# Patient Record
Sex: Female | Born: 1935 | Race: White | Hispanic: No | Smoking: Former smoker
Health system: Southern US, Community
[De-identification: ages and names within clinical notes are randomized; demographics above are authoritative.]

## PROBLEM LIST (undated history)

## (undated) DIAGNOSIS — E039 Hypothyroidism, unspecified: Secondary | ICD-10-CM

## (undated) DIAGNOSIS — K219 Gastro-esophageal reflux disease without esophagitis: Secondary | ICD-10-CM

## (undated) DIAGNOSIS — D509 Iron deficiency anemia, unspecified: Principal | ICD-10-CM

## (undated) DIAGNOSIS — F0390 Unspecified dementia without behavioral disturbance: Secondary | ICD-10-CM

## (undated) DIAGNOSIS — E119 Type 2 diabetes mellitus without complications: Secondary | ICD-10-CM

## (undated) DIAGNOSIS — N183 Chronic kidney disease, stage 3 (moderate): Secondary | ICD-10-CM

## (undated) DIAGNOSIS — E7800 Pure hypercholesterolemia, unspecified: Secondary | ICD-10-CM

## (undated) DIAGNOSIS — I10 Essential (primary) hypertension: Secondary | ICD-10-CM

## (undated) DIAGNOSIS — G459 Transient cerebral ischemic attack, unspecified: Secondary | ICD-10-CM

## (undated) DIAGNOSIS — M1990 Unspecified osteoarthritis, unspecified site: Secondary | ICD-10-CM

## (undated) DIAGNOSIS — D631 Anemia in chronic kidney disease: Secondary | ICD-10-CM

## (undated) HISTORY — DX: Pure hypercholesterolemia, unspecified: E78.00

## (undated) HISTORY — DX: Iron deficiency anemia, unspecified: D50.9

## (undated) HISTORY — PX: BLADDER SUSPENSION: SHX72

## (undated) HISTORY — PX: CATARACT EXTRACTION: SUR2

## (undated) HISTORY — DX: Hypothyroidism, unspecified: E03.9

## (undated) HISTORY — DX: Gastro-esophageal reflux disease without esophagitis: K21.9

## (undated) HISTORY — DX: Unspecified dementia without behavioral disturbance: F03.90

## (undated) HISTORY — DX: Transient cerebral ischemic attack, unspecified: G45.9

## (undated) HISTORY — DX: Anemia in chronic kidney disease: D63.1

## (undated) HISTORY — DX: Type 2 diabetes mellitus without complications: E11.9

## (undated) HISTORY — PX: ANTERIOR (CYSTOCELE) AND POSTERIOR REPAIR (RECTOCELE) WITH XENFORM GRAFT AND SACROSPINOUS FIXATION: SHX6492

## (undated) HISTORY — DX: Unspecified osteoarthritis, unspecified site: M19.90

## (undated) HISTORY — DX: Chronic kidney disease, stage 3 (moderate): N18.3

## (undated) HISTORY — DX: Essential (primary) hypertension: I10

## (undated) HISTORY — PX: VAGINAL HYSTERECTOMY: SUR661

## (undated) HISTORY — PX: BREAST BIOPSY: SHX20

---

## 2007-05-02 HISTORY — PX: COLONOSCOPY: SHX174

## 2008-05-06 HISTORY — PX: TOTAL HIP ARTHROPLASTY: SHX124

## 2011-02-02 HISTORY — PX: CHOLECYSTECTOMY: SHX55

## 2011-05-19 LAB — HM MAMMOGRAPHY: HM Mammogram: NEGATIVE

## 2012-02-02 HISTORY — PX: ERCP: SHX5425

## 2014-02-12 ENCOUNTER — Encounter: Payer: Self-pay | Admitting: Hematology & Oncology

## 2014-03-01 ENCOUNTER — Telehealth: Payer: Self-pay | Admitting: Hematology & Oncology

## 2014-03-01 NOTE — Telephone Encounter (Signed)
I spoke w NEW PATIENT med tech Aniceto Boss w [place]) today to remind them of their appointment with Dr. Marin Olp. Also, advised them to bring all medication bottles and insurance card information.  They said the pt will have med list on a card.

## 2014-03-04 ENCOUNTER — Other Ambulatory Visit (HOSPITAL_BASED_OUTPATIENT_CLINIC_OR_DEPARTMENT_OTHER): Payer: Medicare Other | Admitting: Lab

## 2014-03-04 ENCOUNTER — Ambulatory Visit (HOSPITAL_BASED_OUTPATIENT_CLINIC_OR_DEPARTMENT_OTHER): Payer: Medicare Other | Admitting: Family

## 2014-03-04 ENCOUNTER — Ambulatory Visit: Payer: Medicare Other

## 2014-03-04 VITALS — BP 160/53 | HR 74 | Temp 98.0°F | Resp 16 | Ht 60.0 in | Wt 118.0 lb

## 2014-03-04 DIAGNOSIS — D509 Iron deficiency anemia, unspecified: Secondary | ICD-10-CM | POA: Diagnosis not present

## 2014-03-04 DIAGNOSIS — D61818 Other pancytopenia: Secondary | ICD-10-CM | POA: Diagnosis not present

## 2014-03-04 DIAGNOSIS — D500 Iron deficiency anemia secondary to blood loss (chronic): Secondary | ICD-10-CM

## 2014-03-04 LAB — IRON AND TIBC CHCC
%SAT: 36 % (ref 21–57)
Iron: 106 ug/dL (ref 41–142)
TIBC: 293 ug/dL (ref 236–444)
UIBC: 187 ug/dL (ref 120–384)

## 2014-03-04 LAB — CMP (CANCER CENTER ONLY)
ALBUMIN: 3 g/dL — AB (ref 3.3–5.5)
ALT(SGPT): 12 U/L (ref 10–47)
AST: 19 U/L (ref 11–38)
Alkaline Phosphatase: 45 U/L (ref 26–84)
BILIRUBIN TOTAL: 0.3 mg/dL (ref 0.20–1.60)
BUN: 27 mg/dL — AB (ref 7–22)
CALCIUM: 10.4 mg/dL — AB (ref 8.0–10.3)
CO2: 26 mEq/L (ref 18–33)
CREATININE: 1.4 mg/dL — AB (ref 0.6–1.2)
Chloride: 101 mEq/L (ref 98–108)
GLUCOSE: 107 mg/dL (ref 73–118)
Potassium: 4.2 mEq/L (ref 3.3–4.7)
Sodium: 135 mEq/L (ref 128–145)
Total Protein: 6.5 g/dL (ref 6.4–8.1)

## 2014-03-04 LAB — FERRITIN CHCC: FERRITIN: 45 ng/mL (ref 9–269)

## 2014-03-04 LAB — CBC WITH DIFFERENTIAL (CANCER CENTER ONLY)
HCT: 29.8 % — ABNORMAL LOW (ref 34.8–46.6)
HGB: 9.6 g/dL — ABNORMAL LOW (ref 11.6–15.9)
MCH: 31.4 pg (ref 26.0–34.0)
MCHC: 32.2 g/dL (ref 32.0–36.0)
MCV: 97 fL (ref 81–101)
Platelets: 249 10*3/uL (ref 145–400)
RBC: 3.06 10*6/uL — ABNORMAL LOW (ref 3.70–5.32)
RDW: 13 % (ref 11.1–15.7)
WBC: 3.3 10*3/uL — ABNORMAL LOW (ref 3.9–10.0)

## 2014-03-04 LAB — CHCC SATELLITE - SMEAR

## 2014-03-04 LAB — MANUAL DIFFERENTIAL (CHCC SATELLITE)
ALC: 1 10*3/uL (ref 0.6–2.2)
ANC (CHCC HP manual diff): 2 10*3/uL (ref 1.5–6.7)
Band Neutrophils: 2 % (ref 0–10)
LYMPH: 29 % (ref 14–48)
MONO: 11 % (ref 0–13)
PLT EST ~~LOC~~: ADEQUATE
Platelet Morphology: NORMAL
SEG: 58 % (ref 40–75)

## 2014-03-04 NOTE — Progress Notes (Signed)
Hematology/Oncology Consultation   Name: Allison Page      MRN: [id number]    Location: Room/bed info not found  Date: 03/04/2014 Time:1:18 PM   REFERRING PHYSICIAN:  Hong Thu T. Page  REASON FOR CONSULT:  Leukopenia and anemia   DIAGNOSIS: Iron deficiency anemia  HISTORY OF PRESENT ILLNESS: Ms. Allison Page is a very pleasant 81 yo female with a recent Hgb of 7.9 and WBC of 2.7. She is c/o feeling very tired and wanting to sleep all the time. Her Hgb today is 9.6 WBC 3.3.  She currently lives in an assisted living facility. She is on iron pills which have caused her to become constipated. As a result she has been placed on Miralax, lactulose and colace. She has trouble crossing a room without getting diarrhea. We will stop all this. She is currently on Zantac and omeprazole for GERD so there is a good chance she is not absorbing the oral iron.  Her daughter would like for her to be set up with a geriatric primary provider. We will refer her to [facility]. She feels that her mother is on a lot of duplicate and unnecessary medications.  She denies fever, chills, n/v, cough, rash, headaches, SOB, chest pain, palpitations, abdominal pain, blood in urine or stool.  No swelling, tenderness, numbness or tingling in her extremities.  She fell and broke her hip a year ago and had a lot of weight loss during that rehabilitation time. She was 96 lbs ay her lowest point. She is now stable at 118 lbs. Her appetite is good and she is staying hydrated.  Her daughter passed away from esophageal cancer and her son is currently battling colon cancer. She has no personal cancer history. No history of bleeding or clotting disorders.  She does or smoke or drink alcohol. She has had a choley and a hysterectomy.    ROS: All other 10 point review of systems is negative.   PAST MEDICAL HISTORY:   No past medical history on file.  ALLERGIES: Allergies  Allergen Reactions  . Penicillins   . Sulfa  Antibiotics       MEDICATIONS:  No current outpatient prescriptions on file prior to visit.   No current facility-administered medications on file prior to visit.   PAST SURGICAL HISTORY No past surgical history on file.  FAMILY HISTORY: No family history on file.  SOCIAL HISTORY:  has no tobacco, alcohol, and drug history on file.  PERFORMANCE STATUS: The patient's performance status is 1 - Symptomatic but completely ambulatory  PHYSICAL EXAM: Most Recent Vital Signs: Blood pressure 160/53, pulse 74, temperature 98 F (36.7 C), temperature source Oral, resp. rate 16, height 5' (1.524 m), weight 118 lb (53.524 kg). BP 160/53 mmHg  Pulse 74  Temp(Src) 98 F (36.7 C) (Oral)  Resp 16  Ht 5' (1.524 m)  Wt 118 lb (53.524 kg)  BMI 23.05 kg/m2  General Appearance:    Alert, cooperative, no distress, appears stated age  Head:    Normocephalic, without obvious abnormality, atraumatic  Eyes:    PERRL, conjunctiva/corneas clear, EOM's intact, fundi    benign, both eyes        Throat:   Lips, mucosa, and tongue normal; teeth and gums normal  Neck:   Supple, symmetrical, trachea midline, no adenopathy;    thyroid:  no enlargement/tenderness/nodules; no carotid   bruit or JVD  Back:     Symmetric, no curvature, ROM normal, no CVA tenderness  Lungs:  Clear to auscultation bilaterally, respirations unlabored  Chest Wall:    No tenderness or deformity   Heart:    Regular rate and rhythm, S1 and S2 normal, no murmur, rub   or gallop     Abdomen:     Soft, non-tender, bowel sounds active all four quadrants,    no masses, no organomegaly        Extremities:   Extremities normal, atraumatic, no cyanosis or edema  Pulses:   2+ and symmetric all extremities  Skin:   Skin color, texture, turgor normal, no rashes or lesions  Lymph nodes:   Cervical, supraclavicular, and axillary nodes normal  Neurologic:   CNII-XII intact, normal strength, sensation and reflexes    throughout     LABORATORY DATA:  Results for orders placed or performed in visit on 03/04/14 (from the past 48 hour(s))  CBC with Differential [facility])     Status: Abnormal   Collection Time: 03/04/14 10:12 AM  Result Value Ref Range   WBC 3.3 (L) 3.9 - 10.0 10e3/uL   RBC 3.06 (L) 3.70 - 5.32 10e6/uL   HGB 9.6 (L) 11.6 - 15.9 g/dL   HCT 29.8 (L) 34.8 - 46.6 %   MCV 97 81 - 101 fL   MCH 31.4 26.0 - 34.0 pg   MCHC 32.2 32.0 - 36.0 g/dL   RDW 13.0 11.1 - 15.7 %   Platelets 249 145 - 400 10e3/uL  [facility] - Smear     Status: None   Collection Time: 03/04/14 10:12 AM  Result Value Ref Range   Smear Result Smear Available   COMPREHENSIVE METABOLIC PANEL ([facility] REFLEX ONLY)     Status: Abnormal   Collection Time: 03/04/14 10:12 AM  Result Value Ref Range   Sodium 135 128 - 145 mEq/L   Potassium 4.2 3.3 - 4.7 mEq/L   Chloride 101 98 - 108 mEq/L   CO2 26 18 - 33 mEq/L   Glucose, Bld 107 73 - 118 mg/dL   BUN, Bld 27 (H) 7 - 22 mg/dL   Creat 1.4 (H) 0.6 - 1.2 mg/dl   Total Bilirubin 0.30 0.20 - 1.60 mg/dl   Alkaline Phosphatase 45 26 - 84 U/L   AST 19 11 - 38 U/L   ALT(SGPT) 12 10 - 47 U/L   Total Protein 6.5 6.4 - 8.1 g/dL   Albumin 3.0 (L) 3.3 - 5.5 g/dL   Calcium 10.4 (H) 8.0 - 10.3 mg/dL  Manual Differential ([facility])     Status: None   Collection Time: 03/04/14 10:12 AM  Result Value Ref Range   ANC ([facility] manual diff) 2.0 1.5 - 6.7 10e3/uL   ALC 1.0 0.6 - 2.2 10e3/uL   SEG 58 40 - 75 %   Band Neutrophils 2 0 - 10 %   LYMPH 29 14 - 48 %   MONO 11 0 - 13 %   Polychromasia Slight Slight   Ovalocyte Few Negative   PLT EST [place] Adequate Adequate   Platelet Morphology Within Normal Limits Within Normal Limits      RADIOGRAPHY: No results found.     PATHOLOGY: None  ASSESSMENT/PLAN: Ms. Allison Page is a very pleasant 81 yo female with a recent Hgb of 7.9 and WBC of 2.7. She is c/o feeling very tired and wanting to sleep all the time. Her Hgb today is 9.6 WBC 3.3.  She is tired and wants to sleep a lot of the time.  We will discontinue  her Nu-iron, lactulose, miralax and colace.  We referred her to [facility] for primary care.  We will see what her iron studies and erythropoietin level show and then schedule her follow-up at that time. She may need Fereheme and Aranesp.  All questions were answered. She and her daughter both know to call the clinic with any problems, questions or concerns. We can certainly see her much sooner if necessary. She was discussed with Dr. Marin Olp and he is in agreement with the aforementioned.   [facility]

## 2014-03-06 ENCOUNTER — Telehealth: Payer: Self-pay | Admitting: Hematology & Oncology

## 2014-03-06 LAB — ERYTHROPOIETIN: Erythropoietin: 21.3 m[IU]/mL — ABNORMAL HIGH (ref 2.6–18.5)

## 2014-03-06 LAB — RETICULOCYTES (CHCC)
ABS RETIC: 31.5 10*3/uL (ref 19.0–186.0)
RBC.: 3.15 MIL/uL — AB (ref 3.87–5.11)
Retic Ct Pct: 1 % (ref 0.4–2.3)

## 2014-03-06 LAB — VITAMIN B12: Vitamin B-12: >2000 pg/mL — ABNORMAL HIGH (ref 211–911)

## 2014-03-06 NOTE — Telephone Encounter (Signed)
daughter called left message. I called her back and left message

## 2014-03-21 ENCOUNTER — Encounter: Payer: Self-pay | Admitting: Hematology & Oncology

## 2014-03-21 ENCOUNTER — Other Ambulatory Visit: Payer: Self-pay | Admitting: Hematology & Oncology

## 2014-03-21 DIAGNOSIS — N183 Chronic kidney disease, stage 3 unspecified: Secondary | ICD-10-CM

## 2014-03-21 DIAGNOSIS — D631 Anemia in chronic kidney disease: Secondary | ICD-10-CM | POA: Insufficient documentation

## 2014-03-21 DIAGNOSIS — D509 Iron deficiency anemia, unspecified: Secondary | ICD-10-CM

## 2014-03-21 HISTORY — DX: Chronic kidney disease, stage 3 unspecified: N18.30

## 2014-03-21 HISTORY — DX: Iron deficiency anemia, unspecified: D50.9

## 2014-03-21 HISTORY — DX: Anemia in chronic kidney disease: D63.1

## 2014-03-22 ENCOUNTER — Ambulatory Visit (INDEPENDENT_AMBULATORY_CARE_PROVIDER_SITE_OTHER): Payer: Medicare Other | Admitting: Internal Medicine

## 2014-03-22 VITALS — BP 144/70 | HR 73 | Temp 99.3°F | Resp 12 | Ht 60.0 in | Wt 119.6 lb

## 2014-03-22 DIAGNOSIS — K219 Gastro-esophageal reflux disease without esophagitis: Secondary | ICD-10-CM | POA: Diagnosis not present

## 2014-03-22 DIAGNOSIS — D509 Iron deficiency anemia, unspecified: Secondary | ICD-10-CM | POA: Diagnosis not present

## 2014-03-22 DIAGNOSIS — E034 Atrophy of thyroid (acquired): Secondary | ICD-10-CM

## 2014-03-22 DIAGNOSIS — G8929 Other chronic pain: Secondary | ICD-10-CM

## 2014-03-22 DIAGNOSIS — I10 Essential (primary) hypertension: Secondary | ICD-10-CM | POA: Diagnosis not present

## 2014-03-22 DIAGNOSIS — Z23 Encounter for immunization: Secondary | ICD-10-CM | POA: Diagnosis not present

## 2014-03-22 DIAGNOSIS — K5730 Diverticulosis of large intestine without perforation or abscess without bleeding: Secondary | ICD-10-CM | POA: Insufficient documentation

## 2014-03-22 DIAGNOSIS — G459 Transient cerebral ischemic attack, unspecified: Secondary | ICD-10-CM | POA: Diagnosis not present

## 2014-03-22 DIAGNOSIS — E119 Type 2 diabetes mellitus without complications: Secondary | ICD-10-CM | POA: Diagnosis not present

## 2014-03-22 DIAGNOSIS — M542 Cervicalgia: Secondary | ICD-10-CM

## 2014-03-22 DIAGNOSIS — E038 Other specified hypothyroidism: Secondary | ICD-10-CM | POA: Diagnosis not present

## 2014-03-22 DIAGNOSIS — E039 Hypothyroidism, unspecified: Secondary | ICD-10-CM | POA: Insufficient documentation

## 2014-03-22 DIAGNOSIS — E785 Hyperlipidemia, unspecified: Secondary | ICD-10-CM

## 2014-03-22 MED ORDER — ZOSTER VACCINE LIVE 19400 UNT/0.65ML ~~LOC~~ SOLR: 0.6500 mL | Freq: Once | SUBCUTANEOUS | Status: DC

## 2014-03-22 MED ORDER — TETANUS-DIPHTH-ACELL PERTUSSIS 5-2.5-18.5 LF-MCG/0.5 IM SUSP: 0.5000 mL | Freq: Once | INTRAMUSCULAR | Status: DC

## 2014-03-22 NOTE — Patient Instructions (Signed)
Continue current medications- our office will call facility to determine which meds she is taking  Return to office in 3 mos for routine office visit

## 2014-03-22 NOTE — Progress Notes (Signed)
Patient ID: Allison Page, female   DOB: Aug 06, 1922, 81 y.o.   MRN: [id number]    Facility  [facility]    Place of Service:   OFFICE   Allergies  Allergen Reactions  . Amoxicillin   . Penicillins   . Sulfa Antibiotics     Chief Complaint  Patient presents with  . Establish Care    New patient establish care, medication management (review and evaluate)   . Torticollis    Patient with chronic concern of neck stiffness that radiates into back     HPI:  81 yo female seen today as a new patient. She is c/a her medications and whether she is taking the appropriate medications. She has relocated to [facility] August 15, 2013 from an [place] ALF. She reports feeling well overall. She has chronic pain in neck and back. Takes prn tramadol which helps. No numbness/tingling in hands/feet. No heavy sensation. She does not recall ever having imaging studies. She has epigastric pain intermittently. Per facility [facility], she is taking zantac and omeprazole. She has severe fatigue and easily falls asleep when seated for several minutes. She is seeing hematology Dr Jarrett Ables and plan to start iron infusions and epogen injection.  She believes she was taking aggrenox to prevent TIAs but unsure if she is taking it now.  Medications: Patient's Medications  New Prescriptions   No medications on file  Previous Medications   ALUMINUM-MAGNESIUM HYDROXIDE-SIMETHICONE (MAALOX) 200-200-20 MG/5ML SUSP    2 teaspoons by mouth 3 times daily as needed for heartburn   AMLODIPINE (NORVASC) 5 MG TABLET    Take 5 mg by mouth daily.   ASPIRIN 81 MG TABLET    Take 81 mg by mouth daily.   ATORVASTATIN (LIPITOR) 20 MG TABLET    Take 20 mg by mouth daily at 6 PM.   CLINDAMYCIN (CLEOCIN) 150 MG CAPSULE    4 by mouth 1 hour prior to dental procedures   GUAIFENESIN (MUCINEX) 600 MG 12 HR TABLET    Take 600 mg by mouth 2 (two) times daily as needed.   HYDRALAZINE (APRESOLINE) 100 MG TABLET    Take 100 mg by mouth daily.   LEVOTHYROXINE  (SYNTHROID, LEVOTHROID) 100 MCG TABLET    Take 100 mcg by mouth daily before breakfast.   MAGNESIUM OXIDE (MAG-OX) 400 MG TABLET    Take 400 mg by mouth daily.   MENTHOL, TOPICAL ANALGESIC, (BENGAY ULTRA STRENGTH EX)    Apply topically. Apply 1 patch once daily for pain   OMEPRAZOLE (PRILOSEC) 10 MG CAPSULE    Take 10 mg by mouth daily.   RANITIDINE (ZANTAC) 150 MG TABLET    Take 150 mg by mouth 2 (two) times daily.   TRAMADOL (ULTRAM) 50 MG TABLET    1/2 by mouth twice daily for pain   TRAZODONE (DESYREL) 50 MG TABLET    Take 50 mg by mouth at bedtime.   VITAMIN B-12 (CYANOCOBALAMIN) 1000 MCG TABLET    Take 1,000 mcg by mouth daily.  Modified Medications   Modified Medication Previous Medication   TDAP (BOOSTRIX) 5-2.5-18.5 LF-MCG/0.5 INJECTION Tdap (BOOSTRIX) 5-2.5-18.5 LF-MCG/0.5 injection      Inject 0.5 mLs into the muscle once.    Inject 0.5 mLs into the muscle once.   ZOSTER VACCINE LIVE, PF, (ZOSTAVAX) 09326 UNT/0.65ML INJECTION zoster vaccine live, PF, (ZOSTAVAX) 71245 UNT/0.65ML injection      Inject 19,400 Units into the skin once.    Inject 0.65 mLs into the skin once.  Discontinued Medications   No medications on file     Review of Systems  Constitutional: Positive for fatigue. Negative for chills and appetite change.  Neurological: Positive for dizziness and weakness (and using walker now). Negative for seizures and numbness.       Admits to some short term memory issues  Psychiatric/Behavioral: Negative for confusion.    As above. She has some rhinorrhea and has sneezing x few days. All other systems reviewed are negative   Filed Vitals:   03/22/14 1335  BP: 144/70  Pulse: 73  Temp: 99.3 F (37.4 C)  TempSrc: Oral  Resp: 12  Height: 5' (1.524 m)  Weight: 119 lb 9.6 oz (54.25 kg)  SpO2: 99%   Body mass index is 23.36 kg/(m^2).  Physical Exam CONSTITUTIONAL: Looks well in NAD. Awake, alert and oriented x 3 HEENT: PERRLA. Oropharynx clear and without  exudate NECK: Supple. Nontender. No palpable cervical or supraclavicular lymph nodes. No carotid bruit b/l. No thyromegaly or thyroid mass palpable.  CVS: Regular rate without murmur, gallop or rub. LUNGS: CTA b/l no wheezing, rales or rhonchi. ABDOMEN: Bowel sounds present x 4. Soft, nontender, nondistended. No palpable mass or bruit. No hepatomegaly EXTREMITIES: No edema b/l. Distal pulses palpable. No calf tenderness PSYCH: Affect, behavior and mood normal MUSC: left PSIS TTP with paravertebral lumbar muscle hypertrophy with ropy tissue texture changes; reduced L>R neck rotation and sidebending; OA extended; left suboccpital muscle hypertrophy and ropy tissue texture changes  Labs reviewed: Abstract on 03/22/2014  Component Date Value Ref Range Status  . HM Mammogram 05/19/2011 Negative, no mammographic evidence of ma   Final  Appointment on 03/04/2014  Component Date Value Ref Range Status  . WBC 03/04/2014 3.3* 3.9 - 10.0 10e3/uL Final  . RBC 03/04/2014 3.06* 3.70 - 5.32 10e6/uL Final  . HGB 03/04/2014 9.6* 11.6 - 15.9 g/dL Final  . HCT 03/04/2014 29.8* 34.8 - 46.6 % Final  . MCV 03/04/2014 97  81 - 101 fL Final  . MCH 03/04/2014 31.4  26.0 - 34.0 pg Final  . MCHC 03/04/2014 32.2  32.0 - 36.0 g/dL Final  . RDW 03/04/2014 13.0  11.1 - 15.7 % Final  . Platelets 03/04/2014 249  145 - 400 10e3/uL Final  . Erythropoietin 03/04/2014 21.3* 2.6 - 18.5 mIU/mL Final  . Smear Result 03/04/2014 Smear Available   Final  . Iron 03/04/2014 106  41 - 142 ug/dL Final  . TIBC 03/04/2014 293  236 - 444 ug/dL Final  . UIBC 03/04/2014 187  120 - 384 ug/dL Final  . %SAT 03/04/2014 36  21 - 57 % Final  . Ferritin 03/04/2014 45  9 - 269 ng/ml Final  . Retic Ct Pct 03/04/2014 1.0  0.4 - 2.3 % Final  . RBC. 03/04/2014 3.15* 3.87 - 5.11 MIL/uL Final  . ABS Retic 03/04/2014 31.5  19.0 - 186.0 K/uL Final  . Vitamin B-12 03/04/2014 >2000* 211 - 911 pg/mL Final  . Sodium 03/04/2014 135  128 - 145 mEq/L  Final  . Potassium 03/04/2014 4.2  3.3 - 4.7 mEq/L Final  . Chloride 03/04/2014 101  98 - 108 mEq/L Final  . CO2 03/04/2014 26  18 - 33 mEq/L Final  . Glucose, Bld 03/04/2014 107  73 - 118 mg/dL Final  . BUN, Bld 03/04/2014 27* 7 - 22 mg/dL Final  . Creat 03/04/2014 1.4* 0.6 - 1.2 mg/dl Final  . Total Bilirubin 03/04/2014 0.30  0.20 - 1.60 mg/dl Final  . Alkaline  Phosphatase 03/04/2014 45  26 - 84 U/L Final  . AST 03/04/2014 19  11 - 38 U/L Final  . ALT(SGPT) 03/04/2014 12  10 - 47 U/L Final  . Total Protein 03/04/2014 6.5  6.4 - 8.1 g/dL Final  . Albumin 03/04/2014 3.0* 3.3 - 5.5 g/dL Final  . Calcium 03/04/2014 10.4* 8.0 - 10.3 mg/dL Final  . ANC ([facility] manual diff) 03/04/2014 2.0  1.5 - 6.7 10e3/uL Final  . ALC 03/04/2014 1.0  0.6 - 2.2 10e3/uL Final  . SEG 03/04/2014 58  40 - 75 % Final  . Band Neutrophils 03/04/2014 2  0 - 10 % Final  . LYMPH 03/04/2014 29  14 - 48 % Final  . MONO 03/04/2014 11  0 - 13 % Final  . Polychromasia 03/04/2014 Slight  Slight Final  . Ovalocyte 03/04/2014 Few  Negative Final  . PLT EST [place] 03/04/2014 Adequate  Adequate Final  . Platelet Morphology 03/04/2014 Within Normal Limits  Within Normal Limits Final   Chart reviewed along with old records. Current ALF contacted and apparently, pt takes meds some times as written but has refused them in the past. Daughter is heavily involved in pt's care  Assessment/Plan   ICD-9-CM ICD-10-CM   1. Essential hypertension, benign - borderline controlled 401.1 I10   2. Type 2 diabetes mellitus without complication unknown control state 250.00 E11.9 CANCELED: Lipid Panel     CANCELED: Hemoglobin A1c  3. Hypothyroidism due to acquired atrophy of thyroid 244.8 E03.8 CANCELED: TSH   246.8 E03.4 CANCELED: T4, Free  4. Transient cerebral ischemia, unspecified transient cerebral ischemia type on ASA tx 435.9 G45.9   5. Gastroesophageal reflux disease without esophagitis 530.81 K21.9   6. Hyperlipidemia LDL goal <100  272.4 E78.5 CANCELED: Lipid Panel  7. Need for vaccination with 13-polyvalent pneumococcal conjugate vaccine V03.82 Z23 Pneumococcal conjugate vaccine 13-valent   8.     Neck and LBP probably due to arthritis   --will continue current medications as Rx  --will check fasting labs to assess control of thyroid, DM and cholesterol  --keep a log of medications being administered by ALF  --keep appt with hematology as scheduled  --will consider cervical and lumbar spine xrays to evaluate pain  --f/u in 3-4 mos for re-eval  [facility] S. Perlie Gold  [facility] [address] [place], [place] [address] [phone] Office (Wednesdays and Fridays 8 AM - 5 PM) [phone] Cell (Monday-Friday 8 AM - 5 PM)

## 2014-03-25 ENCOUNTER — Telehealth: Payer: Self-pay | Admitting: *Deleted

## 2014-03-25 ENCOUNTER — Ambulatory Visit (HOSPITAL_BASED_OUTPATIENT_CLINIC_OR_DEPARTMENT_OTHER): Payer: Medicare Other

## 2014-03-25 ENCOUNTER — Ambulatory Visit: Payer: Medicare Other

## 2014-03-25 DIAGNOSIS — D631 Anemia in chronic kidney disease: Secondary | ICD-10-CM

## 2014-03-25 DIAGNOSIS — D509 Iron deficiency anemia, unspecified: Secondary | ICD-10-CM

## 2014-03-25 DIAGNOSIS — N183 Chronic kidney disease, stage 3 unspecified: Secondary | ICD-10-CM

## 2014-03-25 MED ORDER — SODIUM CHLORIDE 0.9 % IV SOLN
510.0000 mg | Freq: Once | INTRAVENOUS | Status: AC
  Administered 2014-03-25: 510 mg via INTRAVENOUS
  Filled 2014-03-25: qty 17

## 2014-03-25 MED ORDER — SODIUM CHLORIDE 0.9 % IV SOLN
Freq: Once | INTRAVENOUS | Status: AC
  Administered 2014-03-25: 14:00:00 via INTRAVENOUS

## 2014-03-25 NOTE — Telephone Encounter (Signed)
[employer] and left message on my voicemail to Return call regarding Labs.  Tried calling and LMOM to return my call.

## 2014-03-25 NOTE — Patient Instructions (Signed)

## 2014-03-27 ENCOUNTER — Other Ambulatory Visit: Payer: Medicare Other

## 2014-03-27 ENCOUNTER — Other Ambulatory Visit: Payer: Self-pay | Admitting: *Deleted

## 2014-03-27 DIAGNOSIS — I10 Essential (primary) hypertension: Secondary | ICD-10-CM

## 2014-03-27 DIAGNOSIS — E119 Type 2 diabetes mellitus without complications: Secondary | ICD-10-CM

## 2014-03-27 DIAGNOSIS — E039 Hypothyroidism, unspecified: Secondary | ICD-10-CM

## 2014-03-29 ENCOUNTER — Other Ambulatory Visit: Payer: Medicare Other

## 2014-03-29 DIAGNOSIS — E119 Type 2 diabetes mellitus without complications: Secondary | ICD-10-CM

## 2014-03-29 DIAGNOSIS — I10 Essential (primary) hypertension: Secondary | ICD-10-CM

## 2014-03-29 DIAGNOSIS — E039 Hypothyroidism, unspecified: Secondary | ICD-10-CM

## 2014-03-30 LAB — LIPID PANEL
CHOL/HDL RATIO: 3 ratio (ref 0.0–4.4)
Cholesterol, Total: 146 mg/dL (ref 100–199)
HDL: 48 mg/dL (ref >39)
LDL CALC: 72 mg/dL (ref 0–99)
TRIGLYCERIDES: 132 mg/dL (ref 0–149)
VLDL Cholesterol Cal: 26 mg/dL (ref 5–40)

## 2014-03-30 LAB — TSH: TSH: 4.49 u[IU]/mL (ref 0.450–4.500)

## 2014-03-30 LAB — HEMOGLOBIN A1C
ESTIMATED AVERAGE GLUCOSE: 131 mg/dL
Hgb A1c MFr Bld: 6.2 % — ABNORMAL HIGH (ref 4.8–5.6)

## 2014-04-11 ENCOUNTER — Other Ambulatory Visit: Payer: Self-pay | Admitting: *Deleted

## 2014-04-11 ENCOUNTER — Ambulatory Visit (HOSPITAL_BASED_OUTPATIENT_CLINIC_OR_DEPARTMENT_OTHER): Payer: Medicare Other

## 2014-04-11 ENCOUNTER — Ambulatory Visit (HOSPITAL_BASED_OUTPATIENT_CLINIC_OR_DEPARTMENT_OTHER): Payer: Medicare Other | Admitting: Hematology & Oncology

## 2014-04-11 ENCOUNTER — Other Ambulatory Visit (HOSPITAL_BASED_OUTPATIENT_CLINIC_OR_DEPARTMENT_OTHER): Payer: Medicare Other | Admitting: Lab

## 2014-04-11 ENCOUNTER — Encounter: Payer: Self-pay | Admitting: Hematology & Oncology

## 2014-04-11 VITALS — BP 143/60 | HR 86 | Temp 98.4°F | Resp 16 | Ht 60.0 in | Wt 119.0 lb

## 2014-04-11 DIAGNOSIS — N183 Chronic kidney disease, stage 3 unspecified: Secondary | ICD-10-CM

## 2014-04-11 DIAGNOSIS — D649 Anemia, unspecified: Secondary | ICD-10-CM

## 2014-04-11 DIAGNOSIS — N289 Disorder of kidney and ureter, unspecified: Secondary | ICD-10-CM

## 2014-04-11 DIAGNOSIS — D631 Anemia in chronic kidney disease: Secondary | ICD-10-CM

## 2014-04-11 DIAGNOSIS — D509 Iron deficiency anemia, unspecified: Secondary | ICD-10-CM

## 2014-04-11 DIAGNOSIS — K219 Gastro-esophageal reflux disease without esophagitis: Secondary | ICD-10-CM

## 2014-04-11 LAB — MANUAL DIFFERENTIAL (CHCC SATELLITE)
ALC: 1.1 10*3/uL (ref 0.6–2.2)
ANC (CHCC HP manual diff): 3.4 10*3/uL (ref 1.5–6.7)
Band Neutrophils: 1 % (ref 0–10)
Eos: 1 % (ref 0–7)
LYMPH: 22 % (ref 14–48)
MONO: 8 % (ref 0–13)
PLT EST ~~LOC~~: ADEQUATE
Platelet Morphology: NORMAL
SEG: 68 % (ref 40–75)

## 2014-04-11 LAB — CBC WITH DIFFERENTIAL (CANCER CENTER ONLY)
HCT: 29.6 % — ABNORMAL LOW (ref 34.8–46.6)
HGB: 9.5 g/dL — ABNORMAL LOW (ref 11.6–15.9)
MCH: 31.4 pg (ref 26.0–34.0)
MCHC: 32.1 g/dL (ref 32.0–36.0)
MCV: 98 fL (ref 81–101)
PLATELETS: 270 10*3/uL (ref 145–400)
RBC: 3.03 10*6/uL — AB (ref 3.70–5.32)
RDW: 13.4 % (ref 11.1–15.7)
WBC: 5 10*3/uL (ref 3.9–10.0)

## 2014-04-11 LAB — RETICULOCYTES (CHCC)
ABS Retic: 39.7 10*3/uL (ref 19.0–186.0)
RBC.: 3.05 MIL/uL — ABNORMAL LOW (ref 3.87–5.11)
Retic Ct Pct: 1.3 % (ref 0.4–2.3)

## 2014-04-11 MED ORDER — SENNOSIDES-DOCUSATE SODIUM 8.6-50 MG PO TABS: ORAL_TABLET | ORAL | Status: AC

## 2014-04-11 MED ORDER — SENNOSIDES-DOCUSATE SODIUM 8.6-50 MG PO TABS: ORAL_TABLET | ORAL | Status: DC

## 2014-04-11 MED ORDER — DARBEPOETIN ALFA 300 MCG/0.6ML IJ SOSY
300.0000 ug | PREFILLED_SYRINGE | Freq: Once | INTRAMUSCULAR | Status: AC
  Administered 2014-04-11: 300 ug via SUBCUTANEOUS

## 2014-04-11 MED ORDER — DARBEPOETIN ALFA 300 MCG/0.6ML IJ SOSY
PREFILLED_SYRINGE | INTRAMUSCULAR | Status: AC
  Filled 2014-04-11: qty 0.6

## 2014-04-11 NOTE — Patient Instructions (Signed)
Darbepoetin Alfa injection What is this medicine? DARBEPOETIN ALFA (dar be POE e tin AL fa) helps your body make more red blood cells. It is used to treat anemia caused by chronic kidney failure and chemotherapy. This medicine may be used for other purposes; ask your health care provider or pharmacist if you have questions. COMMON BRAND NAME(S): Aranesp What should I tell my health care provider before I take this medicine? They need to know if you have any of these conditions: -blood clotting disorders or history of blood clots -cancer patient not on chemotherapy -cystic fibrosis -heart disease, such as angina, heart failure, or a history of a heart attack -hemoglobin level of 12 g/dL or greater -high blood pressure -low levels of folate, iron, or vitamin B12 -seizures -an unusual or allergic reaction to darbepoetin, erythropoietin, albumin, hamster proteins, latex, other medicines, foods, dyes, or preservatives -pregnant or trying to get pregnant -breast-feeding How should I use this medicine? This medicine is for injection into a vein or under the skin. It is usually given by a health care professional in a hospital or clinic setting. If you get this medicine at home, you will be taught how to prepare and give this medicine. Do not shake the solution before you withdraw a dose. Use exactly as directed. Take your medicine at regular intervals. Do not take your medicine more often than directed. It is important that you put your used needles and syringes in a special sharps container. Do not put them in a trash can. If you do not have a sharps container, call your pharmacist or healthcare provider to get one. Talk to your pediatrician regarding the use of this medicine in children. While this medicine may be used in children as young as 1 year for selected conditions, precautions do apply. Overdosage: If you think you have taken too much of this medicine contact a poison control center or  emergency room at once. NOTE: This medicine is only for you. Do not share this medicine with others. What if I miss a dose? If you miss a dose, take it as soon as you can. If it is almost time for your next dose, take only that dose. Do not take double or extra doses. What may interact with this medicine? Do not take this medicine with any of the following medications: -epoetin alfa This list may not describe all possible interactions. Give your health care provider a list of all the medicines, herbs, non-prescription drugs, or dietary supplements you use. Also tell them if you smoke, drink alcohol, or use illegal drugs. Some items may interact with your medicine. What should I watch for while using this medicine? Visit your prescriber or health care professional for regular checks on your progress and for the needed blood tests and blood pressure measurements. It is especially important for the doctor to make sure your hemoglobin level is in the desired range, to limit the risk of potential side effects and to give you the best benefit. Keep all appointments for any recommended tests. Check your blood pressure as directed. Ask your doctor what your blood pressure should be and when you should contact him or her. As your body makes more red blood cells, you may need to take iron, folic acid, or vitamin B supplements. Ask your doctor or health care provider which products are right for you. If you have kidney disease continue dietary restrictions, even though this medication can make you feel better. Talk with your doctor or health   care professional about the foods you eat and the vitamins that you take. What side effects may I notice from receiving this medicine? Side effects that you should report to your doctor or health care professional as soon as possible: -allergic reactions like skin rash, itching or hives, swelling of the face, lips, or tongue -breathing problems -changes in vision -chest  pain -confusion, trouble speaking or understanding -feeling faint or lightheaded, falls -high blood pressure -muscle aches or pains -pain, swelling, warmth in the leg -rapid weight gain -severe headaches -sudden numbness or weakness of the face, arm or leg -trouble walking, dizziness, loss of balance or coordination -seizures (convulsions) -swelling of the ankles, feet, hands -unusually weak or tired Side effects that usually do not require medical attention (report to your doctor or health care professional if they continue or are bothersome): -diarrhea -fever, chills (flu-like symptoms) -headaches -nausea, vomiting -redness, stinging, or swelling at site where injected This list may not describe all possible side effects. Call your doctor for medical advice about side effects. You may report side effects to FDA at 1-800-FDA-1088. Where should I keep my medicine? Keep out of the reach of children. Store in a refrigerator between 2 and 8 degrees C (36 and 46 degrees F). Do not freeze. Do not shake. Throw away any unused portion if using a single-dose vial. Throw away any unused medicine after the expiration date. NOTE: This sheet is a summary. It may not cover all possible information. If you have questions about this medicine, talk to your doctor, pharmacist, or health care provider.  2015, [employer]. (2008-01-02 10:23:57)  

## 2014-04-11 NOTE — Progress Notes (Signed)
Hematology and Oncology Follow Up Visit  Allison Page [id number] 1923-01-19 81 y.o. 04/11/2014   Principle Diagnosis:   Anemia of renal insufficiency  Iron deficiency anemia  Current Therapy:    IV iron as indicated  Aranesp 300 g subcutaneous for hemoglobin less than 10.     Interim History:  Allison Page is back for follow-up. She was last seen back in early February. She has iron deficiency anemia and anemia of renal insufficiency. Her erythropoietin level is only 21.  Last got iron back in February. At that point, her ferritin was 45% with iron saturation of 36%.  She is doing okay. She and her daughter went down to [place] recently. We had a nice time. They were visiting family. Her son is dealing with stage IV colon cancer.  She is eating okay. She's had no nausea vomiting. She's had no change in bowel or bladder habits.  There's been no leg swelling. She's had no rashes. She's had no weight loss or weight gain.  Overall, her performance status is ECOG 2.   Medications:  Current outpatient prescriptions:  .  aluminum-magnesium hydroxide-simethicone (MAALOX) 262-035-59 MG/5ML SUSP, 2 teaspoons by mouth 3 times daily as needed for heartburn, Disp: , Rfl:  .  amLODipine (NORVASC) 5 MG tablet, Take 5 mg by mouth daily., Disp: , Rfl:  .  aspirin 81 MG tablet, Take 81 mg by mouth daily., Disp: , Rfl:  .  atorvastatin (LIPITOR) 20 MG tablet, Take 20 mg by mouth daily at 6 PM., Disp: , Rfl:  .  clindamycin (CLEOCIN) 150 MG capsule, 4 by mouth 1 hour prior to dental procedures, Disp: , Rfl:  .  hydrALAZINE (APRESOLINE) 100 MG tablet, Take 100 mg by mouth daily., Disp: , Rfl:  .  levothyroxine (SYNTHROID, LEVOTHROID) 100 MCG tablet, Take 100 mcg by mouth daily before breakfast., Disp: , Rfl:  .  magnesium oxide (MAG-OX) 400 MG tablet, Take 400 mg by mouth daily., Disp: , Rfl:  .  Menthol, Topical Analgesic, (BENGAY ULTRA STRENGTH EX), Apply topically. Apply 1 patch once  daily for pain, Disp: , Rfl:  .  omeprazole (PRILOSEC) 10 MG capsule, Take 10 mg by mouth daily., Disp: , Rfl:  .  ranitidine (ZANTAC) 150 MG tablet, Take 150 mg by mouth 2 (two) times daily., Disp: , Rfl:  .  traMADol (ULTRAM) 50 MG tablet, 1/2 by mouth twice daily for pain, Disp: , Rfl:  .  traZODone (DESYREL) 50 MG tablet, Take 50 mg by mouth at bedtime., Disp: , Rfl:  .  vitamin B-12 (CYANOCOBALAMIN) 1000 MCG tablet, Take 1,000 mcg by mouth daily., Disp: , Rfl:  .  guaiFENesin (MUCINEX) 600 MG 12 hr tablet, Take 600 mg by mouth 2 (two) times daily as needed., Disp: , Rfl:  .  senna-docusate (SENOKOT-S) 8.6-50 MG per tablet, Take 1-2 tablets, IF NEEDED, every 12 hrs for constipation, Disp: 60 tablet, Rfl: 4  Allergies:  Allergies  Allergen Reactions  . Amoxicillin   . Penicillins   . Sulfa Antibiotics     Past Medical History, Surgical history, Social history, and Family History were reviewed and updated.  Review of Systems: As above  Physical Exam:  height is 5' (1.524 m) and weight is 119 lb (53.978 kg). Her oral temperature is 98.4 F (36.9 C). Her blood pressure is 143/60 and her pulse is 86. Her respiration is 16.   Wt Readings from Last 3 Encounters:  04/11/14 119 lb (53.978 kg)  03/22/14 119  lb 9.6 oz (54.25 kg)  03/04/14 118 lb (53.524 kg)     Elderly white female in no obvious distress. Head and neck exam shows no ocular or oral lesions. There are no palpable cervical or supraclavicular lymph nodes. Lungs are clear. Cardiac exam regular rate and rhythm with no murmurs, rubs or bruits. Abdomen is soft. She has good bowel sounds. There is no fluid wave. There is no palpable liver or spleen tip. Extremities shows some trace edema in her legs. She has some age-related osteoarthritic changes in her joints. She has 4+/5 strength in her extremities. Skin exam shows no rashes, ecchymoses or petechia. Neurological exam shows no focal neurological deficits.  Lab Results    Component Value Date   WBC 5.0 04/11/2014   HGB 9.5* 04/11/2014   HCT 29.6* 04/11/2014   MCV 98 04/11/2014   PLT 270 04/11/2014     Chemistry      Component Value Date/Time   NA 135 03/04/2014 1012   K 4.2 03/04/2014 1012   CL 101 03/04/2014 1012   CO2 26 03/04/2014 1012   BUN 27* 03/04/2014 1012   CREATININE 1.4* 03/04/2014 1012      Component Value Date/Time   CALCIUM 10.4* 03/04/2014 1012   ALKPHOS 45 03/04/2014 1012   AST 19 03/04/2014 1012   ALT 12 03/04/2014 1012   BILITOT 0.30 03/04/2014 1012         Impression and Plan: Allison Page is 81 year old white female. She will be 81 years old in a couple months.  She clearly will need Aranesp. She got iron. Her iron levels should be adequate.  I talked to her and her daughter about Aranesp. I explained why I thought and has would help. I explained that she just was not making enough erythropoietin to stimulate her own bone marrow. Because of this, she is anemic and will continue to be anemic despite IV supplementation.  I told her that the risk of Aranesp is that the blood pressure can go up and that this can lead to an increased risk of stroke and heart attack. However, I think that her risk for her will be quite low.  I would think that we just need to keep her hemoglobin above 10 and this should help her overall quality of life.  She agrees as well as her daughter agrees, to have the Aranesp. As such, we will give her a 300 mg dose today.  I want to see her back in about 4 weeks. By then, we should see the results of the Aranesp.  I spent about 30 minutes with them today.   Volanda Napoleon, MD 3/10/20165:33 PM

## 2014-04-12 ENCOUNTER — Telehealth: Payer: Self-pay | Admitting: *Deleted

## 2014-04-12 ENCOUNTER — Encounter: Payer: Self-pay | Admitting: Hematology & Oncology

## 2014-04-12 LAB — FERRITIN CHCC: Ferritin: 408 ng/ml — ABNORMAL HIGH (ref 9–269)

## 2014-04-12 LAB — IRON AND TIBC CHCC
%SAT: 38 % (ref 21–57)
IRON: 89 ug/dL (ref 41–142)
TIBC: 238 ug/dL (ref 236–444)
UIBC: 148 ug/dL (ref 120–384)

## 2014-04-12 NOTE — Telephone Encounter (Signed)
Daughter aware  Notes Recorded by Volanda Napoleon, MD on 04/12/2014 at 3:56 PM Call her dgtr - iron level is ok!!! pete

## 2014-04-16 ENCOUNTER — Telehealth: Payer: Self-pay | Admitting: *Deleted

## 2014-04-16 NOTE — Telephone Encounter (Signed)
Daughter stated that you were going to review patient's medication list and see if there were going to be any changes. Daughter had felt she was over medicated and you were going to review it the medications and send them to [facility] #: [phone]. Please Advise.

## 2014-04-16 NOTE — Telephone Encounter (Signed)
Yes the medications were reviewed on the day of her visit. We contacted her last facility and was informed that she does not take all her medications as rx all the time. Recommend she remains on her current medications as ordered and f/u as scheduled

## 2014-04-17 ENCOUNTER — Encounter: Payer: Self-pay | Admitting: Hematology & Oncology

## 2014-04-17 NOTE — Telephone Encounter (Signed)
Patient daughter Notified. She wants to know if it would be ok to give her mother Miralax for her constipation. Please Advise. Also wanted to let you know that Dr. Marin Olp prescribed Vitamin D 2000units once daily.

## 2014-04-18 NOTE — Telephone Encounter (Signed)
Patient daughter Notified and agreed.  

## 2014-04-18 NOTE — Telephone Encounter (Signed)
Yes she may have prn miralax for constipation. She needs to continue colace

## 2014-05-01 ENCOUNTER — Encounter: Payer: Self-pay | Admitting: Internal Medicine

## 2014-05-02 ENCOUNTER — Other Ambulatory Visit: Payer: Self-pay | Admitting: *Deleted

## 2014-05-02 ENCOUNTER — Telehealth: Payer: Self-pay | Admitting: *Deleted

## 2014-05-02 NOTE — Telephone Encounter (Signed)
Hello Dr. Eulas Post,    My mother, Allison Page, is your patient and currently resides at [facility] facility. They will be sending you refill requests for her medications as needed. I would like to ask that when you receive these requests this next month, that you do not refill the Mucinex or the [employer]. I have requested this from the facility but since these two items have been on her past orders from the facility doctor, they will not change it unless you make the change since you are now her doctor.      Mucinex was ordered in October when she had a cold but she has not had a cold since then and it keeps being reordered. The patches do not offer her much relief and they are expensive. Besides, she has several still to use if needed. If you will please remove both of these items from her monthly pharmacy order, it would be greatly appreciated.      Thank you for your attention to this matter.       Reina Fuse (daughter.- POA)    Sent reply back to daughter to inform her that her request has been filled. SRice  RMA

## 2014-05-17 ENCOUNTER — Ambulatory Visit (HOSPITAL_BASED_OUTPATIENT_CLINIC_OR_DEPARTMENT_OTHER): Payer: Medicare Other | Admitting: Hematology & Oncology

## 2014-05-17 ENCOUNTER — Encounter: Payer: Self-pay | Admitting: Hematology & Oncology

## 2014-05-17 ENCOUNTER — Ambulatory Visit: Payer: Medicare Other

## 2014-05-17 ENCOUNTER — Other Ambulatory Visit (HOSPITAL_BASED_OUTPATIENT_CLINIC_OR_DEPARTMENT_OTHER): Payer: Medicare Other

## 2014-05-17 VITALS — BP 150/58 | HR 73 | Temp 98.1°F | Resp 14 | Ht 60.0 in | Wt 121.0 lb

## 2014-05-17 DIAGNOSIS — D631 Anemia in chronic kidney disease: Secondary | ICD-10-CM

## 2014-05-17 DIAGNOSIS — N183 Chronic kidney disease, stage 3 unspecified: Secondary | ICD-10-CM

## 2014-05-17 DIAGNOSIS — D509 Iron deficiency anemia, unspecified: Secondary | ICD-10-CM

## 2014-05-17 DIAGNOSIS — K219 Gastro-esophageal reflux disease without esophagitis: Secondary | ICD-10-CM

## 2014-05-17 LAB — CBC WITH DIFFERENTIAL (CANCER CENTER ONLY)
HEMATOCRIT: 35 % (ref 34.8–46.6)
HEMOGLOBIN: 11.4 g/dL — AB (ref 11.6–15.9)
MCH: 31.6 pg (ref 26.0–34.0)
MCHC: 32.6 g/dL (ref 32.0–36.0)
MCV: 97 fL (ref 81–101)
Platelets: 221 10*3/uL (ref 145–400)
RBC: 3.61 10*6/uL — ABNORMAL LOW (ref 3.70–5.32)
RDW: 13.2 % (ref 11.1–15.7)
WBC: 3.3 10*3/uL — ABNORMAL LOW (ref 3.9–10.0)

## 2014-05-17 LAB — CMP (CANCER CENTER ONLY)
ALK PHOS: 66 U/L (ref 26–84)
ALT(SGPT): 17 U/L (ref 10–47)
AST: 20 U/L (ref 11–38)
Albumin: 3.6 g/dL (ref 3.3–5.5)
BUN, Bld: 31 mg/dL — ABNORMAL HIGH (ref 7–22)
CO2: 29 mEq/L (ref 18–33)
CREATININE: 1.7 mg/dL — AB (ref 0.6–1.2)
Calcium: 11 mg/dL — ABNORMAL HIGH (ref 8.0–10.3)
Chloride: 101 mEq/L (ref 98–108)
Glucose, Bld: 108 mg/dL (ref 73–118)
Potassium: 4 mEq/L (ref 3.3–4.7)
Sodium: 141 mEq/L (ref 128–145)
Total Bilirubin: 0.4 mg/dl (ref 0.20–1.60)
Total Protein: 7.6 g/dL (ref 6.4–8.1)

## 2014-05-17 LAB — MANUAL DIFFERENTIAL (CHCC SATELLITE)
ALC: 0.7 10*3/uL (ref 0.6–2.2)
ANC (CHCC HP manual diff): 2.5 10*3/uL (ref 1.5–6.7)
EOS: 1 % (ref 0–7)
LYMPH: 21 % (ref 14–48)
MONO: 3 % (ref 0–13)
PLT EST ~~LOC~~: ADEQUATE
SEG: 75 % (ref 40–75)

## 2014-05-17 LAB — FERRITIN: Ferritin: 194 ng/mL (ref 10–291)

## 2014-05-17 LAB — RETICULOCYTES (CHCC)
ABS RETIC: 21.9 10*3/uL (ref 19.0–186.0)
RBC.: 3.65 MIL/uL — ABNORMAL LOW (ref 3.87–5.11)
RETIC CT PCT: 0.6 % (ref 0.4–2.3)

## 2014-05-17 LAB — IRON AND TIBC
%SAT: 41 % (ref 20–55)
IRON: 109 ug/dL (ref 42–145)
TIBC: 267 ug/dL (ref 250–470)
UIBC: 158 ug/dL (ref 125–400)

## 2014-05-17 NOTE — Progress Notes (Signed)
Hematology and Oncology Follow Up Visit  Allison Page [id number] 05-28-1922 81 y.o. 05/17/2014   Principle Diagnosis:   Anemia of renal insufficiency  Iron deficiency anemia  Current Therapy:    IV iron as indicated  Aranesp 300 g subcutaneous for hemoglobin less than 10.     Interim History:  Allison Page is back for follow-up. She really looks great. We did go ahead and give her Aranesp last month. I think this probably helped her.  She is looking forward to her 89th birthday in May.  She is eating pretty well. She's having no problems with cough. There's no change in bowel or bladder habits. She does feel "bloated." I told her to try some Pepto-Bismol.  She's not noted any issues with fever. She's had no leg swelling. She's had no bleeding or bruising.  Unfortunately, her son still is doing very poorly down in [place]. He has metastatic colon cancer.   Overall, her performance status is ECOG 2.   Medications:  Current outpatient prescriptions:  .  aluminum-magnesium hydroxide-simethicone (MAALOX) 573-220-25 MG/5ML SUSP, 2 teaspoons by mouth 3 times daily as needed for heartburn, Disp: , Rfl:  .  amLODipine (NORVASC) 5 MG tablet, Take 5 mg by mouth daily., Disp: , Rfl:  .  aspirin 81 MG tablet, Take 81 mg by mouth daily., Disp: , Rfl:  .  atorvastatin (LIPITOR) 20 MG tablet, Take 20 mg by mouth daily at 6 PM., Disp: , Rfl:  .  hydrALAZINE (APRESOLINE) 100 MG tablet, Take 100 mg by mouth daily., Disp: , Rfl:  .  levothyroxine (SYNTHROID, LEVOTHROID) 100 MCG tablet, Take 100 mcg by mouth daily before breakfast., Disp: , Rfl:  .  magnesium oxide (MAG-OX) 400 MG tablet, Take 400 mg by mouth daily., Disp: , Rfl:  .  omeprazole (PRILOSEC) 10 MG capsule, Take 10 mg by mouth daily., Disp: , Rfl:  .  ranitidine (ZANTAC) 150 MG tablet, Take 150 mg by mouth 2 (two) times daily., Disp: , Rfl:  .  senna-docusate (SENOKOT-S) 8.6-50 MG per tablet, Take 1-2 tablets, IF NEEDED,  every 12 hrs for constipation, Disp: 60 tablet, Rfl: 4 .  traMADol (ULTRAM) 50 MG tablet, 1/2 by mouth twice daily for pain, Disp: , Rfl:  .  traZODone (DESYREL) 50 MG tablet, Take 50 mg by mouth at bedtime., Disp: , Rfl:  .  vitamin B-12 (CYANOCOBALAMIN) 1000 MCG tablet, Take 1,000 mcg by mouth daily., Disp: , Rfl:  .  clindamycin (CLEOCIN) 150 MG capsule, 4 by mouth 1 hour prior to dental procedures, Disp: , Rfl:   Allergies:  Allergies  Allergen Reactions  . Amoxicillin   . Penicillins   . Sulfa Antibiotics     Past Medical History, Surgical history, Social history, and Family History were reviewed and updated.  Review of Systems: As above  Physical Exam:  height is 5' (1.524 m) and weight is 121 lb (54.885 kg). Her oral temperature is 98.1 F (36.7 C). Her blood pressure is 150/58 and her pulse is 73. Her respiration is 14.   Wt Readings from Last 3 Encounters:  05/17/14 121 lb (54.885 kg)  04/11/14 119 lb (53.978 kg)  03/22/14 119 lb 9.6 oz (54.25 kg)     [name] in no obvious distress. Head and neck exam shows no ocular or oral lesions. There are no palpable cervical or supraclavicular lymph nodes. Lungs are clear. Cardiac exam regular rate and rhythm with no murmurs, rubs or bruits. Abdomen is soft.  She has good bowel sounds. There may be a little bit of distention. There is no fluid wave. There is no palpable liver or spleen tip. Extremities shows some trace edema in her legs. She has some age-related osteoarthritic changes in her joints. She has 4+/5 strength in her extremities. Skin exam shows no rashes, ecchymoses or petechia. Neurological exam shows no focal neurological deficits.  Lab Results  Component Value Date   WBC 3.3* 05/17/2014   HGB 11.4* 05/17/2014   HCT 35.0 05/17/2014   MCV 97 05/17/2014   PLT 221 05/17/2014     Chemistry      Component Value Date/Time   NA 141 05/17/2014 1406   K 4.0 05/17/2014 1406   CL 101 05/17/2014 1406   CO2 29  05/17/2014 1406   BUN 31* 05/17/2014 1406   CREATININE 1.7* 05/17/2014 1406      Component Value Date/Time   CALCIUM 11.0* 05/17/2014 1406   ALKPHOS 66 05/17/2014 1406   AST 20 05/17/2014 1406   ALT 17 05/17/2014 1406   BILITOT 0.40 05/17/2014 1406         Impression and Plan: Allison Page is 81 year old white female. She will be 81 years old next month.  She clearly will need Aranesp intermittently. She hasn't an erythropoietin level of only 21. As such, I think that she probably will need Aranesp with see her back in a couple months.  We will see what her iron studies show. Hopefully, they are still doing okay.   Again, I will see her back in 2 months. Volanda Napoleon, MD 4/15/20163:57 PM

## 2014-05-21 ENCOUNTER — Encounter: Payer: Self-pay | Admitting: Internal Medicine

## 2014-06-21 ENCOUNTER — Ambulatory Visit: Payer: Medicare Other | Admitting: Internal Medicine

## 2014-07-05 ENCOUNTER — Ambulatory Visit (INDEPENDENT_AMBULATORY_CARE_PROVIDER_SITE_OTHER): Payer: Medicare Other | Admitting: Internal Medicine

## 2014-07-05 ENCOUNTER — Encounter: Payer: Self-pay | Admitting: Internal Medicine

## 2014-07-05 VITALS — BP 158/62 | HR 94 | Temp 98.1°F | Resp 20 | Ht 60.0 in | Wt 118.8 lb

## 2014-07-05 DIAGNOSIS — M545 Low back pain: Secondary | ICD-10-CM

## 2014-07-05 DIAGNOSIS — I10 Essential (primary) hypertension: Secondary | ICD-10-CM | POA: Diagnosis not present

## 2014-07-05 DIAGNOSIS — R351 Nocturia: Secondary | ICD-10-CM

## 2014-07-05 DIAGNOSIS — R55 Syncope and collapse: Secondary | ICD-10-CM

## 2014-07-05 DIAGNOSIS — E038 Other specified hypothyroidism: Secondary | ICD-10-CM

## 2014-07-05 DIAGNOSIS — R5383 Other fatigue: Secondary | ICD-10-CM | POA: Diagnosis not present

## 2014-07-05 DIAGNOSIS — E034 Atrophy of thyroid (acquired): Secondary | ICD-10-CM | POA: Diagnosis not present

## 2014-07-05 DIAGNOSIS — E119 Type 2 diabetes mellitus without complications: Secondary | ICD-10-CM

## 2014-07-05 LAB — POCT URINALYSIS DIPSTICK
Bilirubin, UA: NEGATIVE
Blood, UA: NEGATIVE
Glucose, UA: 250
KETONES UA: NEGATIVE
Nitrite, UA: NEGATIVE
Protein, UA: 100
SPEC GRAV UA: 1.01
UROBILINOGEN UA: 0.2
pH, UA: 6.5

## 2014-07-05 NOTE — Progress Notes (Signed)
Patient ID: Allison Page, female   DOB: Nov 25, 1922, 81 y.o.   MRN: [id number]    Location:    [facility]   Place of Service:   OFFICE  Chief Complaint  Patient presents with  . Medical Management of Chronic Issues    3 month follow-up  . Neck Pain    pain has gotten worse in last 2 months  . Back Pain    same as neck,past out today ,stomach feels tight     HPI:  81 yo female seen today for f/u. She has syncopal episode this AM at home. She was found sitting in her chair with head lying on back chair and mouth open. BP 98/38 and she was told to eat drink. BP increased to 102/48. She c/o feeling really cold today. No past hx syncope. She is amnestic of event. She had taken her regular meds about 1 hr prior to event. She also reports 3 time nocturia that began last night.  She c/o neck and back pain not relieved with taking tramadol. It is debilitating at times and interferes with ADLs, including eating.  BP is elevated but she takes amlodipine, hydralazine as Rx  She is followed by hematology for iron deficiency anemia. No iron infusion req'd at last visit in April 2016  Denies low BS reactions but BS not checked this AM when she had LOC  She reports feeling really tired during the day and wonders if she can stop taking trazodone for insomnia.  She takes Levothyroxine for hypothyroid  She occasionally has GERD sx's despite taking omeprazole    Past Medical History  Diagnosis Date  . Iron deficiency anemia 03/21/2014  . Anemia of chronic renal failure, stage 3 (moderate) 03/21/2014  . High blood pressure   . Acid reflux   . TIA (transient ischemic attack)   . Diabetes   . High cholesterol   . Hypothyroidism   . Arthritis     Past Surgical History  Procedure Laterality Date  . Total hip arthroplasty Left 05/06/2008  . Cholecystectomy  2013  . Vaginal hysterectomy    . Ercp  2014  . Colonoscopy  05/02/2007  . Bladder suspension    . Anterior (cystocele) and posterior repair  (rectocele) with xenform graft and sacrospinous fixation    . Cataract extraction    . Breast biopsy      Patient Care Team: Gildardo Cranker, DO as PCP - General (Internal Medicine)  History   Social History  . Marital Status: Unknown    Spouse Name: N/A  . Number of Children: N/A  . Years of Education: N/A   Occupational History  . Not on file.   Social History Main Topics  . Smoking status: Former Smoker -- 1.00 packs/day for 25 years    Types: Cigarettes    Quit date: 04/11/1970  . Smokeless tobacco: Never Used     Comment: quit 42 years ago  . Alcohol Use: No  . Drug Use: No  . Sexual Activity: Not on file   Other Topics Concern  . Not on file   Social History Narrative   Diet: Diabetic diet   Caffeine:no    Married: widowed   House: Assisted Living   Pets: No   Current/Past profession: Housewife   Exercise: No   Living Will: Yes   DNR: Yes   POA/HPOA: N/A           reports that she quit smoking about 44 years ago. Her smoking  use included Cigarettes. She has a 25 pack-year smoking history. She has never used smokeless tobacco. She reports that she does not drink alcohol or use illicit drugs.  Allergies  Allergen Reactions  . Amoxicillin   . Penicillins   . Sulfa Antibiotics     Medications: Patient's Medications  New Prescriptions   No medications on file  Previous Medications   ALUMINUM-MAGNESIUM HYDROXIDE-SIMETHICONE (MAALOX) 200-200-20 MG/5ML SUSP    2 teaspoons by mouth 3 times daily as needed for heartburn   AMLODIPINE (NORVASC) 5 MG TABLET    Take 5 mg by mouth daily.   ASPIRIN 81 MG TABLET    Take 81 mg by mouth daily.   ATORVASTATIN (LIPITOR) 20 MG TABLET    Take 20 mg by mouth daily at 6 PM.   CLINDAMYCIN (CLEOCIN) 150 MG CAPSULE    4 by mouth 1 hour prior to dental procedures   HYDRALAZINE (APRESOLINE) 100 MG TABLET    Take 100 mg by mouth daily.   LEVOTHYROXINE (SYNTHROID, LEVOTHROID) 100 MCG TABLET    Take 100 mcg by mouth daily before  breakfast.   MAGNESIUM OXIDE (MAG-OX) 400 MG TABLET    Take 400 mg by mouth daily.   OMEPRAZOLE (PRILOSEC) 10 MG CAPSULE    Take 10 mg by mouth daily.   RANITIDINE (ZANTAC) 150 MG TABLET    Take 150 mg by mouth 2 (two) times daily.   SENNA-DOCUSATE (SENOKOT-S) 8.6-50 MG PER TABLET    Take 1-2 tablets, IF NEEDED, every 12 hrs for constipation   TRAMADOL (ULTRAM) 50 MG TABLET    1/2 by mouth twice daily for pain   TRAZODONE (DESYREL) 50 MG TABLET    Take 50 mg by mouth at bedtime.   VITAMIN B-12 (CYANOCOBALAMIN) 1000 MCG TABLET    Take 1,000 mcg by mouth daily.  Modified Medications   No medications on file  Discontinued Medications   No medications on file    Review of Systems  Constitutional: Positive for chills and fatigue. Negative for fever, diaphoresis, activity change and appetite change.  HENT: Negative for ear pain and sore throat.   Eyes: Negative for visual disturbance.  Respiratory: Negative for cough, chest tightness and shortness of breath.   Cardiovascular: Negative for chest pain, palpitations and leg swelling.  Gastrointestinal: Negative for nausea, vomiting, abdominal pain, diarrhea, constipation and blood in stool.  Genitourinary: Positive for frequency. Negative for dysuria.  Musculoskeletal: Negative for arthralgias.  Neurological: Positive for dizziness and weakness. Negative for tremors, numbness and headaches.  Psychiatric/Behavioral: Negative for sleep disturbance. The patient is not nervous/anxious.     Filed Vitals:   07/05/14 1432  BP: 158/62  Pulse: 94  Temp: 98.1 F (36.7 C)  TempSrc: Oral  Resp: 20  Height: 5' (1.524 m)  Weight: 118 lb 12.8 oz (53.887 kg)  SpO2: 99%   Body mass index is 23.2 kg/(m^2).  Physical Exam  Constitutional: She is oriented to person, place, and time. She appears well-developed and well-nourished. No distress.  Looks ill in NAD. Daughter present. (+) rigors  HENT:  Mouth/Throat: Oropharynx is clear and moist. No  oropharyngeal exudate.  Eyes: EOM are normal. Pupils are equal, round, and reactive to light. No scleral icterus.  Neck: Neck supple. No tracheal deviation present. No thyromegaly present.  Cardiovascular: Normal rate, regular rhythm, normal heart sounds and intact distal pulses.  Exam reveals no gallop and no friction rub.   No murmur heard. Trace LE edema b/l. no calf TTP. No  carotid bruit b/l  Pulmonary/Chest: Effort normal and breath sounds normal. No stridor. No respiratory distress. She has no wheezes. She has no rales.  Abdominal: Soft. Bowel sounds are normal. She exhibits distension. She exhibits no mass. There is tenderness (LLQ). There is no rebound and no guarding.  Lymphadenopathy:    She has no cervical adenopathy.  Neurological: She is alert and oriented to person, place, and time. She has normal reflexes.  Skin: Skin is warm and dry. No rash noted.  Psychiatric: She has a normal mood and affect. Her behavior is normal. Judgment and thought content normal.     Labs reviewed: Office Visit on 05/17/2014  Component Date Value Ref Range Status  . Ferritin 05/17/2014 194  10 - 291 ng/mL Final  . Iron 05/17/2014 109  42 - 145 ug/dL Final  . UIBC 05/17/2014 158  125 - 400 ug/dL Final  . TIBC 05/17/2014 267  250 - 470 ug/dL Final  . %SAT 05/17/2014 41  20 - 55 % Final  Appointment on 05/17/2014  Component Date Value Ref Range Status  . WBC 05/17/2014 3.3* 3.9 - 10.0 10e3/uL Final  . RBC 05/17/2014 3.61* 3.70 - 5.32 10e6/uL Final  . HGB 05/17/2014 11.4* 11.6 - 15.9 g/dL Final  . HCT 05/17/2014 35.0  34.8 - 46.6 % Final  . MCV 05/17/2014 97  81 - 101 fL Final  . MCH 05/17/2014 31.6  26.0 - 34.0 pg Final  . MCHC 05/17/2014 32.6  32.0 - 36.0 g/dL Final  . RDW 05/17/2014 13.2  11.1 - 15.7 % Final  . Platelets 05/17/2014 221  145 - 400 10e3/uL Final  . Retic Ct Pct 05/17/2014 0.6  0.4 - 2.3 % Final  . RBC. 05/17/2014 3.65* 3.87 - 5.11 MIL/uL Final  . ABS Retic 05/17/2014 21.9   19.0 - 186.0 K/uL Final  . Sodium 05/17/2014 141  128 - 145 mEq/L Final  . Potassium 05/17/2014 4.0  3.3 - 4.7 mEq/L Final  . Chloride 05/17/2014 101  98 - 108 mEq/L Final  . CO2 05/17/2014 29  18 - 33 mEq/L Final  . Glucose, Bld 05/17/2014 108  73 - 118 mg/dL Final  . BUN, Bld 05/17/2014 31* 7 - 22 mg/dL Final  . Creat 05/17/2014 1.7* 0.6 - 1.2 mg/dl Final  . Total Bilirubin 05/17/2014 0.40  0.20 - 1.60 mg/dl Final  . Alkaline Phosphatase 05/17/2014 66  26 - 84 U/L Final  . AST 05/17/2014 20  11 - 38 U/L Final  . ALT(SGPT) 05/17/2014 17  10 - 47 U/L Final  . Total Protein 05/17/2014 7.6  6.4 - 8.1 g/dL Final  . Albumin 05/17/2014 3.6  3.3 - 5.5 g/dL Final  . Calcium 05/17/2014 11.0* 8.0 - 10.3 mg/dL Final  . ANC ([facility] HP manual diff) 05/17/2014 2.5  1.5 - 6.7 10e3/uL Final  . ALC 05/17/2014 0.7  0.6 - 2.2 10e3/uL Final  . SEG 05/17/2014 75  40 - 75 % Final  . LYMPH 05/17/2014 21  14 - 48 % Final  . MONO 05/17/2014 3  0 - 13 % Final  . Eos 05/17/2014 1  0 - 7 % Final  . Polychromasia 05/17/2014 Slight  Slight Final  . Ovalocyte 05/17/2014 Few  Negative Final  . PLT EST [place] 05/17/2014 Adequate  Adequate Final  Appointment on 04/11/2014  Component Date Value Ref Range Status  . WBC 04/11/2014 5.0  3.9 - 10.0 10e3/uL Final  . RBC 04/11/2014 3.03* 3.70 - 5.32  10e6/uL Final  . HGB 04/11/2014 9.5* 11.6 - 15.9 g/dL Final  . HCT 04/11/2014 29.6* 34.8 - 46.6 % Final  . MCV 04/11/2014 98  81 - 101 fL Final  . MCH 04/11/2014 31.4  26.0 - 34.0 pg Final  . MCHC 04/11/2014 32.1  32.0 - 36.0 g/dL Final  . RDW 04/11/2014 13.4  11.1 - 15.7 % Final  . Platelets 04/11/2014 270  145 - 400 10e3/uL Final  . Iron 04/11/2014 89  41 - 142 ug/dL Final  . TIBC 04/11/2014 238  236 - 444 ug/dL Final  . UIBC 04/11/2014 148  120 - 384 ug/dL Final  . %SAT 04/11/2014 38  21 - 57 % Final  . Ferritin 04/11/2014 408* 9 - 269 ng/ml Final  . Retic Ct Pct 04/11/2014 1.3  0.4 - 2.3 % Final  . RBC. 04/11/2014 3.05*  3.87 - 5.11 MIL/uL Final  . ABS Retic 04/11/2014 39.7  19.0 - 186.0 K/uL Final  . ANC ([facility] HP manual diff) 04/11/2014 3.4  1.5 - 6.7 10e3/uL Final  . ALC 04/11/2014 1.1  0.6 - 2.2 10e3/uL Final  . SEG 04/11/2014 68  40 - 75 % Final  . Band Neutrophils 04/11/2014 1  0 - 10 % Final  . LYMPH 04/11/2014 22  14 - 48 % Final  . MONO 04/11/2014 8  0 - 13 % Final  . Eos 04/11/2014 1  0 - 7 % Final  . Polychromasia 04/11/2014 Slight  Slight Final  . Ovalocyte 04/11/2014 Few  Negative Final  . PLT EST [place] 04/11/2014 Adequate  Adequate Final  . Platelet Morphology 04/11/2014 Within Normal Limits  Within Normal Limits Final    No results found.   Assessment/Plan   ICD-9-CM ICD-10-CM   1. Syncope, unspecified syncope type - etiology unknown 780.2 R55 CBC with Differential     CMP  2. Bilateral low back pain, with sciatica presence unspecified 724.2 M54.5 POC Urinalysis Dipstick  3. Type 2 diabetes mellitus without complication - stable 678.93 E11.9 TSH     T4, Free  4. Essential hypertension, benign - BP elevated 401.1 I10   5. Nocturia - etiology unknown 788.43 R35.1 Urinalysis with Reflex Microscopic  6. Other fatigue - new 780.79 R53.83 CBC with Differential  7. Hypothyroidism due to acquired atrophy of thyroid - stable 244.8 E03.8 TSH   246.8 E03.4 T4, Free    --check orthostatic BP - neg results  --check urine dipstick and if (+) send UA with cx and s.   --check CBC w diff, CMP, TSH and free T4  --f/u as scheduled with hematology  --will hold off adjusting pain med until syncope w/u completed. May need cardio and/or neurology eval  --she is having dysphagia also. Will re-assess at next [place] in 1 month for f/u fatigue   Donnabelle Blanchard S. Perlie Gold  [facility] [address] [place], [place] [address] [phone] Cell (Monday-Friday 8 AM - 5 PM) [phone] After 5 PM and follow prompts

## 2014-07-05 NOTE — Patient Instructions (Addendum)
STOP trazodone due to sedation  Continue other medications as ordered  Will call with lab results  follow up in 1 month to reassess  Go to the ED if symptoms persist or worsen

## 2014-07-06 LAB — URINALYSIS, ROUTINE W REFLEX MICROSCOPIC
Bilirubin, UA: NEGATIVE
KETONES UA: NEGATIVE
Nitrite, UA: NEGATIVE
RBC, UA: NEGATIVE
Specific Gravity, UA: 1.015 (ref 1.005–1.030)
UUROB: 0.2 mg/dL (ref 0.2–1.0)
pH, UA: 6.5 (ref 5.0–7.5)

## 2014-07-06 LAB — MICROSCOPIC EXAMINATION: Casts: NONE SEEN /lpf

## 2014-07-06 LAB — CBC WITH DIFFERENTIAL/PLATELET
Basophils Absolute: 0 10*3/uL (ref 0.0–0.2)
Basos: 0 %
EOS (ABSOLUTE): 0.1 10*3/uL (ref 0.0–0.4)
EOS: 1 %
Hematocrit: 31.7 % — ABNORMAL LOW (ref 34.0–46.6)
Hemoglobin: 10.3 g/dL — ABNORMAL LOW (ref 11.1–15.9)
IMMATURE GRANULOCYTES: 0 %
Immature Grans (Abs): 0 10*3/uL (ref 0.0–0.1)
Lymphocytes Absolute: 0.8 10*3/uL (ref 0.7–3.1)
Lymphs: 13 %
MCH: 30.7 pg (ref 26.6–33.0)
MCHC: 32.5 g/dL (ref 31.5–35.7)
MCV: 94 fL (ref 79–97)
MONOCYTES: 8 %
Monocytes Absolute: 0.5 10*3/uL (ref 0.1–0.9)
Neutrophils Absolute: 4.5 10*3/uL (ref 1.4–7.0)
Neutrophils: 78 %
Platelets: 278 10*3/uL (ref 150–379)
RBC: 3.36 x10E6/uL — ABNORMAL LOW (ref 3.77–5.28)
RDW: 13.8 % (ref 12.3–15.4)
WBC: 5.8 10*3/uL (ref 3.4–10.8)

## 2014-07-06 LAB — COMPREHENSIVE METABOLIC PANEL
ALK PHOS: 65 IU/L (ref 39–117)
ALT: 7 IU/L (ref 0–32)
AST: 12 IU/L (ref 0–40)
Albumin/Globulin Ratio: 1.3 (ref 1.1–2.5)
Albumin: 4 g/dL (ref 3.2–4.6)
BUN / CREAT RATIO: 14 (ref 11–26)
BUN: 24 mg/dL (ref 10–36)
Bilirubin Total: 0.2 mg/dL (ref 0.0–1.2)
CO2: 22 mmol/L (ref 18–29)
Calcium: 11.1 mg/dL — ABNORMAL HIGH (ref 8.7–10.3)
Chloride: 98 mmol/L (ref 97–108)
Creatinine, Ser: 1.69 mg/dL — ABNORMAL HIGH (ref 0.57–1.00)
GFR, EST AFRICAN AMERICAN: 30 mL/min/{1.73_m2} — AB (ref >59)
GFR, EST NON AFRICAN AMERICAN: 26 mL/min/{1.73_m2} — AB (ref >59)
Globulin, Total: 3.1 g/dL (ref 1.5–4.5)
Glucose: 133 mg/dL — ABNORMAL HIGH (ref 65–99)
Potassium: 4.4 mmol/L (ref 3.5–5.2)
Sodium: 135 mmol/L (ref 134–144)
TOTAL PROTEIN: 7.1 g/dL (ref 6.0–8.5)

## 2014-07-06 LAB — T4, FREE: Free T4: 1.44 ng/dL (ref 0.82–1.77)

## 2014-07-06 LAB — TSH: TSH: 2.38 u[IU]/mL (ref 0.450–4.500)

## 2014-07-09 ENCOUNTER — Encounter: Payer: Self-pay | Admitting: Internal Medicine

## 2014-07-10 ENCOUNTER — Other Ambulatory Visit: Payer: Self-pay

## 2014-07-10 DIAGNOSIS — R55 Syncope and collapse: Secondary | ICD-10-CM

## 2014-07-10 NOTE — Telephone Encounter (Signed)
I called patients daughter, discussed labs, placed orders for 2D Echo and Neuro referral. Patients daughter cancelled pending appointment for July 8,2016. Patients daughter will call to reschedule once Neuro referral and 2D Echo results received

## 2014-07-18 ENCOUNTER — Other Ambulatory Visit (HOSPITAL_COMMUNITY): Payer: Medicare Other

## 2014-07-19 ENCOUNTER — Ambulatory Visit (HOSPITAL_BASED_OUTPATIENT_CLINIC_OR_DEPARTMENT_OTHER): Payer: Medicare Other | Admitting: Hematology & Oncology

## 2014-07-19 ENCOUNTER — Other Ambulatory Visit (HOSPITAL_BASED_OUTPATIENT_CLINIC_OR_DEPARTMENT_OTHER): Payer: Medicare Other

## 2014-07-19 VITALS — BP 135/49 | HR 73 | Temp 98.3°F | Resp 20 | Wt 118.8 lb

## 2014-07-19 DIAGNOSIS — N289 Disorder of kidney and ureter, unspecified: Secondary | ICD-10-CM | POA: Diagnosis not present

## 2014-07-19 DIAGNOSIS — D649 Anemia, unspecified: Secondary | ICD-10-CM

## 2014-07-19 DIAGNOSIS — D509 Iron deficiency anemia, unspecified: Secondary | ICD-10-CM

## 2014-07-19 DIAGNOSIS — D631 Anemia in chronic kidney disease: Secondary | ICD-10-CM

## 2014-07-19 DIAGNOSIS — N183 Chronic kidney disease, stage 3 unspecified: Secondary | ICD-10-CM

## 2014-07-19 LAB — CMP (CANCER CENTER ONLY)
ALBUMIN: 3.3 g/dL (ref 3.3–5.5)
ALT(SGPT): 14 U/L (ref 10–47)
AST: 17 U/L (ref 11–38)
Alkaline Phosphatase: 62 U/L (ref 26–84)
BUN: 32 mg/dL — AB (ref 7–22)
CO2: 27 meq/L (ref 18–33)
Calcium: 10 mg/dL (ref 8.0–10.3)
Chloride: 95 mEq/L — ABNORMAL LOW (ref 98–108)
Creat: 1.8 mg/dl — ABNORMAL HIGH (ref 0.6–1.2)
Glucose, Bld: 126 mg/dL — ABNORMAL HIGH (ref 73–118)
Potassium: 4.3 mEq/L (ref 3.3–4.7)
Sodium: 133 mEq/L (ref 128–145)
TOTAL PROTEIN: 7 g/dL (ref 6.4–8.1)
Total Bilirubin: 0.5 mg/dl (ref 0.20–1.60)

## 2014-07-19 LAB — MANUAL DIFFERENTIAL (CHCC SATELLITE)
ALC: 1.5 10*3/uL (ref 0.6–2.2)
ANC (CHCC HP manual diff): 2 10*3/uL (ref 1.5–6.7)
BASO: 2 % (ref 0–2)
Eos: 1 % (ref 0–7)
LYMPH: 39 % (ref 14–48)
MONO: 5 % (ref 0–13)
PLATELET MORPHOLOGY: NORMAL
PLT EST ~~LOC~~: ADEQUATE
SEG: 53 % (ref 40–75)

## 2014-07-19 LAB — FERRITIN: FERRITIN: 204 ng/mL (ref 10–291)

## 2014-07-19 LAB — CBC WITH DIFFERENTIAL (CANCER CENTER ONLY)
HCT: 30.8 % — ABNORMAL LOW (ref 34.8–46.6)
HGB: 10.2 g/dL — ABNORMAL LOW (ref 11.6–15.9)
MCH: 31.9 pg (ref 26.0–34.0)
MCHC: 33.1 g/dL (ref 32.0–36.0)
MCV: 96 fL (ref 81–101)
PLATELETS: 353 10*3/uL (ref 145–400)
RBC: 3.2 10*6/uL — AB (ref 3.70–5.32)
RDW: 13.4 % (ref 11.1–15.7)
WBC: 3.8 10*3/uL — ABNORMAL LOW (ref 3.9–10.0)

## 2014-07-19 LAB — RETICULOCYTES (CHCC)
ABS Retic: 43.7 10*3/uL (ref 19.0–186.0)
RBC.: 3.36 MIL/uL — ABNORMAL LOW (ref 3.87–5.11)
RETIC CT PCT: 1.3 % (ref 0.4–2.3)

## 2014-07-19 LAB — IRON AND TIBC
%SAT: 42 % (ref 20–55)
Iron: 107 ug/dL (ref 42–145)
TIBC: 257 ug/dL (ref 250–470)
UIBC: 150 ug/dL (ref 125–400)

## 2014-07-19 NOTE — Progress Notes (Signed)
Hematology and Oncology Follow Up Visit  Allison Page [id number] 1922/06/29 81 y.o. 07/19/2014   Principle Diagnosis:   Anemia of renal insufficiency  Iron deficiency anemia  Current Therapy:    IV iron as indicated  Aranesp 300 g subcutaneous for hemoglobin less than 10.     Interim History:  Ms. Allison Page is back for follow-up. She had a really nice 89th birthday. She had this about a month ago.  Unfortunately, her son who lived in [place] passed on from colon cancer. This is been difficult for her.  We've not given her an Aranesp injection probably since March.  In April, when I saw her, her ferritin was 194 with an iron saturation of 41%. I think the last time that she received iron was back in February.  She's had no issues with pain. She has had no issues with cough or shortness of breath. She's had no leg swelling. She's had an episode of syncope. Not sure as to why this would've happened.  There's been no obvious bleeding. She's had no changes in bowel or bladder habits.  Overall, her performance status is ECOG 2.   Medications:  Current outpatient prescriptions:  .  aluminum-magnesium hydroxide-simethicone (MAALOX) [phone] MG/5ML SUSP, 2 teaspoons by mouth 3 times daily as needed for heartburn, Disp: , Rfl:  .  amLODipine (NORVASC) 5 MG tablet, Take 5 mg by mouth daily., Disp: , Rfl:  .  aspirin 81 MG tablet, Take 81 mg by mouth daily., Disp: , Rfl:  .  atorvastatin (LIPITOR) 20 MG tablet, Take 20 mg by mouth daily at 6 PM., Disp: , Rfl:  .  cholecalciferol (VITAMIN D) 1000 UNITS tablet, Take 1,000 Units by mouth daily., Disp: , Rfl:  .  clindamycin (CLEOCIN) 150 MG capsule, 4 by mouth 1 hour prior to dental procedures, Disp: , Rfl:  .  hydrALAZINE (APRESOLINE) 100 MG tablet, Take 100 mg by mouth daily., Disp: , Rfl:  .  levothyroxine (SYNTHROID, LEVOTHROID) 100 MCG tablet, Take 100 mcg by mouth daily before breakfast., Disp: , Rfl:  .  magnesium oxide  (MAG-OX) 400 MG tablet, Take 400 mg by mouth daily., Disp: , Rfl:  .  omeprazole (PRILOSEC) 10 MG capsule, Take 10 mg by mouth daily., Disp: , Rfl:  .  ranitidine (ZANTAC) 150 MG tablet, Take 150 mg by mouth 2 (two) times daily., Disp: , Rfl:  .  senna-docusate (SENOKOT-S) 8.6-50 MG per tablet, Take 1-2 tablets, IF NEEDED, every 12 hrs for constipation, Disp: 60 tablet, Rfl: 4 .  traMADol (ULTRAM) 50 MG tablet, One tab daily., Disp: , Rfl:  .  vitamin B-12 (CYANOCOBALAMIN) 1000 MCG tablet, Take 1,000 mcg by mouth daily., Disp: , Rfl:   Allergies:  Allergies  Allergen Reactions  . Amoxicillin   . Penicillins   . Sulfa Antibiotics     Past Medical History, Surgical history, Social history, and Family History were reviewed and updated.  Review of Systems: As above  Physical Exam:  weight is 118 lb 12 oz (53.865 kg). Her oral temperature is 98.3 F (36.8 C). Her blood pressure is 135/49 and her pulse is 73. Her respiration is 20.   Wt Readings from Last 3 Encounters:  07/19/14 118 lb 12 oz (53.865 kg)  07/05/14 118 lb 12.8 oz (53.887 kg)  05/17/14 121 lb (54.885 kg)     Elderly white female in no obvious distress. Head and neck exam shows no ocular or oral lesions. There are no palpable cervical or  supraclavicular lymph nodes. Lungs are clear. Cardiac exam regular rate and rhythm with no murmurs, rubs or bruits. Abdomen is soft. She has good bowel sounds. There may be a little bit of distention. There is no fluid wave. There is no palpable liver or spleen tip. Extremities shows some trace edema in her legs. She has some age-related osteoarthritic changes in her joints. She has 4+/5 strength in her extremities. Skin exam shows no rashes, ecchymoses or petechia. Neurological exam shows no focal neurological deficits.  Lab Results  Component Value Date   WBC 3.8* 07/19/2014   HGB 10.2* 07/19/2014   HCT 30.8* 07/19/2014   MCV 96 07/19/2014   PLT 353 07/19/2014     Chemistry        Component Value Date/Time   NA 133 07/19/2014 1409   NA 135 07/05/2014 1622   K 4.3 07/19/2014 1409   K 4.4 07/05/2014 1622   CL 95* 07/19/2014 1409   CL 98 07/05/2014 1622   CO2 27 07/19/2014 1409   CO2 22 07/05/2014 1622   BUN 32* 07/19/2014 1409   BUN 24 07/05/2014 1622   CREATININE 1.8* 07/19/2014 1409   CREATININE 1.69* 07/05/2014 1622      Component Value Date/Time   CALCIUM 10.0 07/19/2014 1409   CALCIUM 11.1* 07/05/2014 1622   ALKPHOS 62 07/19/2014 1409   ALKPHOS 65 07/05/2014 1622   AST 17 07/19/2014 1409   AST 12 07/05/2014 1622   ALT 14 07/19/2014 1409   ALT 7 07/05/2014 1622   BILITOT 0.50 07/19/2014 1409   BILITOT 0.2 07/05/2014 1622         Impression and Plan: Allison Page is 81 year old white female.   I feel that for her with the death of her son. This has been hard on her.  She does not need any Aranesp today. I suspect that what we see her back, that she will need a dose.  We will see what her iron studies show.  I will pray hard for her so she can help get through the emotional trauma of her son's passing.  Again, I will see her back in 6 weeks    Volanda Napoleon, MD 6/17/20164:57 PM

## 2014-07-22 ENCOUNTER — Telehealth: Payer: Self-pay | Admitting: *Deleted

## 2014-07-22 ENCOUNTER — Other Ambulatory Visit: Payer: Self-pay

## 2014-07-22 ENCOUNTER — Ambulatory Visit (HOSPITAL_COMMUNITY): Payer: Medicare Other | Attending: Cardiology

## 2014-07-22 DIAGNOSIS — R55 Syncope and collapse: Secondary | ICD-10-CM | POA: Insufficient documentation

## 2014-07-22 NOTE — Telephone Encounter (Addendum)
Patient's daughter aware of results  ----- Message from Volanda Napoleon, MD sent at 07/22/2014  3:12 PM EDT ----- Call her dgtr - iron level is ok!!  pete

## 2014-08-09 ENCOUNTER — Ambulatory Visit: Payer: Medicare Other | Admitting: Internal Medicine

## 2014-08-13 ENCOUNTER — Other Ambulatory Visit: Payer: Self-pay | Admitting: *Deleted

## 2014-08-13 DIAGNOSIS — D61818 Other pancytopenia: Secondary | ICD-10-CM

## 2014-08-13 DIAGNOSIS — D500 Iron deficiency anemia secondary to blood loss (chronic): Secondary | ICD-10-CM

## 2014-08-13 MED ORDER — TRAMADOL HCL 50 MG PO TABS: ORAL_TABLET | ORAL | Status: DC

## 2014-08-13 NOTE — Telephone Encounter (Signed)
[employer]

## 2014-08-19 ENCOUNTER — Telehealth: Payer: Self-pay | Admitting: Internal Medicine

## 2014-08-19 NOTE — Telephone Encounter (Signed)
Patient's daughter Reina Fuse, Amanda's husband and father in law all have been seeing you since early 1's and [name] would love to have you take on her mother as a new patient.  Please advise

## 2014-08-22 NOTE — Telephone Encounter (Signed)
Ok with me 

## 2014-08-23 NOTE — Telephone Encounter (Signed)
Got scheduled  °

## 2014-08-30 ENCOUNTER — Other Ambulatory Visit (HOSPITAL_BASED_OUTPATIENT_CLINIC_OR_DEPARTMENT_OTHER): Payer: Medicare Other

## 2014-08-30 ENCOUNTER — Encounter: Payer: Self-pay | Admitting: Hematology & Oncology

## 2014-08-30 ENCOUNTER — Ambulatory Visit (HOSPITAL_BASED_OUTPATIENT_CLINIC_OR_DEPARTMENT_OTHER): Payer: Medicare Other

## 2014-08-30 ENCOUNTER — Ambulatory Visit (HOSPITAL_BASED_OUTPATIENT_CLINIC_OR_DEPARTMENT_OTHER): Payer: Medicare Other | Admitting: Hematology & Oncology

## 2014-08-30 VITALS — BP 142/50 | HR 73 | Temp 98.1°F | Resp 16 | Ht 60.0 in | Wt 124.0 lb

## 2014-08-30 DIAGNOSIS — N183 Chronic kidney disease, stage 3 unspecified: Secondary | ICD-10-CM

## 2014-08-30 DIAGNOSIS — D509 Iron deficiency anemia, unspecified: Secondary | ICD-10-CM

## 2014-08-30 DIAGNOSIS — D631 Anemia in chronic kidney disease: Secondary | ICD-10-CM

## 2014-08-30 LAB — CMP (CANCER CENTER ONLY)
ALBUMIN: 3.3 g/dL (ref 3.3–5.5)
ALK PHOS: 58 U/L (ref 26–84)
ALT: 13 U/L (ref 10–47)
AST: 18 U/L (ref 11–38)
BUN: 33 mg/dL — AB (ref 7–22)
CALCIUM: 10.5 mg/dL — AB (ref 8.0–10.3)
CO2: 27 mEq/L (ref 18–33)
Chloride: 104 mEq/L (ref 98–108)
Creat: 1.7 mg/dl — ABNORMAL HIGH (ref 0.6–1.2)
Glucose, Bld: 132 mg/dL — ABNORMAL HIGH (ref 73–118)
POTASSIUM: 4.3 meq/L (ref 3.3–4.7)
SODIUM: 139 meq/L (ref 128–145)
Total Bilirubin: 0.5 mg/dl (ref 0.20–1.60)
Total Protein: 6.9 g/dL (ref 6.4–8.1)

## 2014-08-30 LAB — CBC WITH DIFFERENTIAL (CANCER CENTER ONLY)
HCT: 27.7 % — ABNORMAL LOW (ref 34.8–46.6)
HGB: 9 g/dL — ABNORMAL LOW (ref 11.6–15.9)
MCH: 31.8 pg (ref 26.0–34.0)
MCHC: 32.5 g/dL (ref 32.0–36.0)
MCV: 98 fL (ref 81–101)
Platelets: 300 10*3/uL (ref 145–400)
RBC: 2.83 10*6/uL — AB (ref 3.70–5.32)
RDW: 13.8 % (ref 11.1–15.7)
WBC: 3.3 10*3/uL — ABNORMAL LOW (ref 3.9–10.0)

## 2014-08-30 LAB — MANUAL DIFFERENTIAL (CHCC SATELLITE)
ALC: 1 10*3/uL (ref 0.6–2.2)
ANC (CHCC HP manual diff): 1.9 10*3/uL (ref 1.5–6.7)
Band Neutrophils: 1 % (ref 0–10)
Eos: 2 % (ref 0–7)
LYMPH: 32 % (ref 14–48)
MONO: 8 % (ref 0–13)
PLT EST ~~LOC~~: ADEQUATE
Platelet Morphology: NORMAL
SEG: 57 % (ref 40–75)

## 2014-08-30 LAB — RETICULOCYTES (CHCC)
ABS Retic: 55.1 10*3/uL (ref 19.0–186.0)
RBC.: 2.9 MIL/uL — ABNORMAL LOW (ref 3.87–5.11)
Retic Ct Pct: 1.9 % (ref 0.4–2.3)

## 2014-08-30 MED ORDER — DARBEPOETIN ALFA 300 MCG/0.6ML IJ SOSY
300.0000 ug | PREFILLED_SYRINGE | Freq: Once | INTRAMUSCULAR | Status: AC
  Administered 2014-08-30: 300 ug via SUBCUTANEOUS

## 2014-08-30 MED ORDER — DARBEPOETIN ALFA 300 MCG/0.6ML IJ SOSY
PREFILLED_SYRINGE | INTRAMUSCULAR | Status: AC
  Filled 2014-08-30: qty 0.6

## 2014-08-30 NOTE — Progress Notes (Signed)
Hematology and Oncology Follow Up Visit  Allison Page [id number] 03/01/1922 81 y.o. 08/30/2014   Principle Diagnosis:   Anemia of renal insufficiency  Iron deficiency anemia  Current Therapy:    IV iron as indicated  Aranesp 300 g subcutaneous for hemoglobin less than 10.     Interim History:  Allison Page is back for follow-up. She had a really nice 81th birthday. She had this about a month ago.  She is doing okay. She has had a pretty uneventful summer.  He is eating okay. She's had no problems with nausea or vomiting.  She does have some constipation. This is chronic.  She's had no rashes. She's had no leg swelling. She's had no change with her medications.  There's been no obvious bleeding. been no obvious bleeding.   Over last saw her in June, her ferritin was 204 with iron saturation of 42%.  Last time we gave her Aranesp was back in March.   Overall, her performance status is ECOG 2.   Medications:  Current outpatient prescriptions:  .  aluminum-magnesium hydroxide-simethicone (MAALOX) [phone] MG/5ML SUSP, 2 teaspoons by mouth 3 times daily as needed for heartburn, Disp: , Rfl:  .  amLODipine (NORVASC) 5 MG tablet, Take 5 mg by mouth daily., Disp: , Rfl:  .  aspirin 81 MG tablet, Take 81 mg by mouth daily., Disp: , Rfl:  .  atorvastatin (LIPITOR) 20 MG tablet, Take 20 mg by mouth daily at 6 PM., Disp: , Rfl:  .  cholecalciferol (VITAMIN D) 1000 UNITS tablet, Take 1,000 Units by mouth daily., Disp: , Rfl:  .  clindamycin (CLEOCIN) 150 MG capsule, 4 by mouth 1 hour prior to dental procedures, Disp: , Rfl:  .  guaiFENesin (MUCINEX) 600 MG 12 hr tablet, Take 600 mg by mouth 2 (two) times daily. As needed for cough and congestion, Disp: , Rfl:  .  hydrALAZINE (APRESOLINE) 100 MG tablet, Take 100 mg by mouth daily., Disp: , Rfl:  .  levothyroxine (SYNTHROID, LEVOTHROID) 100 MCG tablet, Take 100 mcg by mouth daily before breakfast., Disp: , Rfl:  .  magnesium  oxide (MAG-OX) 400 MG tablet, Take 400 mg by mouth daily., Disp: , Rfl:  .  Melatonin 3 MG TABS, Take by mouth. 2 by mouth at bedtime, Disp: , Rfl:  .  omeprazole (PRILOSEC) 10 MG capsule, Take 10 mg by mouth daily., Disp: , Rfl:  .  ranitidine (ZANTAC) 150 MG tablet, Take 150 mg by mouth 2 (two) times daily., Disp: , Rfl:  .  senna-docusate (SENOKOT-S) 8.6-50 MG per tablet, Take 1-2 tablets, IF NEEDED, every 12 hrs for constipation, Disp: 60 tablet, Rfl: 4 .  traMADol (ULTRAM) 50 MG tablet, Take one tablet by mouth twice daily for pain, Disp: 60 tablet, Rfl: 5 .  vitamin B-12 (CYANOCOBALAMIN) 1000 MCG tablet, Take 1,000 mcg by mouth daily., Disp: , Rfl:  No current facility-administered medications for this visit.  Facility-Administered Medications Ordered in Other Visits:  .  Darbepoetin Alfa (ARANESP) injection 300 mcg, 300 mcg, Subcutaneous, Once, Volanda Napoleon, MD  Allergies:  Allergies  Allergen Reactions  . Amoxicillin   . Penicillins   . Sulfa Antibiotics     Past Medical History, Surgical history, Social history, and Family History were reviewed and updated.  Review of Systems: As above  Physical Exam:  height is 5' (1.524 m) and weight is 124 lb (56.246 kg). Her oral temperature is 98.1 F (36.7 C). Her blood pressure is 142/50  and her pulse is 73. Her respiration is 16.   Wt Readings from Last 3 Encounters:  08/30/14 124 lb (56.246 kg)  07/19/14 118 lb 12 oz (53.865 kg)  07/05/14 118 lb 12.8 oz (53.887 kg)     Elderly white female in no obvious distress. Head and neck exam shows no ocular or oral lesions. There are no palpable cervical or supraclavicular lymph nodes. Lungs are clear. Cardiac exam regular rate and rhythm with no murmurs, rubs or bruits. Abdomen is soft. She has good bowel sounds. There may be a little bit of distention. There is no fluid wave. There is no palpable liver or spleen tip. Extremities shows some trace edema in her legs. She has some  age-related osteoarthritic changes in her joints. She has 4+/5 strength in her extremities. Skin exam shows no rashes, ecchymoses or petechia. Neurological exam shows no focal neurological deficits.  Lab Results  Component Value Date   WBC 3.3* 08/30/2014   HGB 9.0* 08/30/2014   HCT 27.7* 08/30/2014   MCV 98 08/30/2014   PLT 300 08/30/2014     Chemistry      Component Value Date/Time   NA 139 08/30/2014 1254   NA 135 07/05/2014 1622   K 4.3 08/30/2014 1254   K 4.4 07/05/2014 1622   CL 104 08/30/2014 1254   CL 98 07/05/2014 1622   CO2 27 08/30/2014 1254   CO2 22 07/05/2014 1622   BUN 33* 08/30/2014 1254   BUN 24 07/05/2014 1622   CREATININE 1.7* 08/30/2014 1254   CREATININE 1.69* 07/05/2014 1622      Component Value Date/Time   CALCIUM 10.5* 08/30/2014 1254   CALCIUM 11.1* 07/05/2014 1622   ALKPHOS 58 08/30/2014 1254   ALKPHOS 65 07/05/2014 1622   AST 18 08/30/2014 1254   AST 12 07/05/2014 1622   ALT 13 08/30/2014 1254   ALT 7 07/05/2014 1622   BILITOT 0.50 08/30/2014 1254   BILITOT 0.2 07/05/2014 1622         Impression and Plan: Allison Page is 81 year old white female.   I we will go ahead and give her Aranesp. I'll obese Pross for iron level was low but this is always a possibility. She has not had iron since February of this year.  Typically, she responds well to Aranesp.  I will plan to go ahead and get her back to see [place] in another month. I want to make sure that we sent tablet the anemia and corrected as much as we can so that her quality of life will be better.   Volanda Napoleon, MD 7/29/20161:48 PM

## 2014-08-30 NOTE — Patient Instructions (Signed)
Darbepoetin Alfa injection What is this medicine? DARBEPOETIN ALFA (dar be POE e tin AL fa) helps your body make more red blood cells. It is used to treat anemia caused by chronic kidney failure and chemotherapy. This medicine may be used for other purposes; ask your health care provider or pharmacist if you have questions. COMMON BRAND NAME(S): Aranesp What should I tell my health care provider before I take this medicine? They need to know if you have any of these conditions: -blood clotting disorders or history of blood clots -cancer patient not on chemotherapy -cystic fibrosis -heart disease, such as angina, heart failure, or a history of a heart attack -hemoglobin level of 12 g/dL or greater -high blood pressure -low levels of folate, iron, or vitamin B12 -seizures -an unusual or allergic reaction to darbepoetin, erythropoietin, albumin, hamster proteins, latex, other medicines, foods, dyes, or preservatives -pregnant or trying to get pregnant -breast-feeding How should I use this medicine? This medicine is for injection into a vein or under the skin. It is usually given by a health care professional in a hospital or clinic setting. If you get this medicine at home, you will be taught how to prepare and give this medicine. Do not shake the solution before you withdraw a dose. Use exactly as directed. Take your medicine at regular intervals. Do not take your medicine more often than directed. It is important that you put your used needles and syringes in a special sharps container. Do not put them in a trash can. If you do not have a sharps container, call your pharmacist or healthcare provider to get one. Talk to your pediatrician regarding the use of this medicine in children. While this medicine may be used in children as young as 1 year for selected conditions, precautions do apply. Overdosage: If you think you have taken too much of this medicine contact a poison control center or  emergency room at once. NOTE: This medicine is only for you. Do not share this medicine with others. What if I miss a dose? If you miss a dose, take it as soon as you can. If it is almost time for your next dose, take only that dose. Do not take double or extra doses. What may interact with this medicine? Do not take this medicine with any of the following medications: -epoetin alfa This list may not describe all possible interactions. Give your health care provider a list of all the medicines, herbs, non-prescription drugs, or dietary supplements you use. Also tell them if you smoke, drink alcohol, or use illegal drugs. Some items may interact with your medicine. What should I watch for while using this medicine? Visit your prescriber or health care professional for regular checks on your progress and for the needed blood tests and blood pressure measurements. It is especially important for the doctor to make sure your hemoglobin level is in the desired range, to limit the risk of potential side effects and to give you the best benefit. Keep all appointments for any recommended tests. Check your blood pressure as directed. Ask your doctor what your blood pressure should be and when you should contact him or her. As your body makes more red blood cells, you may need to take iron, folic acid, or vitamin B supplements. Ask your doctor or health care provider which products are right for you. If you have kidney disease continue dietary restrictions, even though this medication can make you feel better. Talk with your doctor or health   care professional about the foods you eat and the vitamins that you take. What side effects may I notice from receiving this medicine? Side effects that you should report to your doctor or health care professional as soon as possible: -allergic reactions like skin rash, itching or hives, swelling of the face, lips, or tongue -breathing problems -changes in vision -chest  pain -confusion, trouble speaking or understanding -feeling faint or lightheaded, falls -high blood pressure -muscle aches or pains -pain, swelling, warmth in the leg -rapid weight gain -severe headaches -sudden numbness or weakness of the face, arm or leg -trouble walking, dizziness, loss of balance or coordination -seizures (convulsions) -swelling of the ankles, feet, hands -unusually weak or tired Side effects that usually do not require medical attention (report to your doctor or health care professional if they continue or are bothersome): -diarrhea -fever, chills (flu-like symptoms) -headaches -nausea, vomiting -redness, stinging, or swelling at site where injected This list may not describe all possible side effects. Call your doctor for medical advice about side effects. You may report side effects to FDA at 1-800-FDA-1088. Where should I keep my medicine? Keep out of the reach of children. Store in a refrigerator between 2 and 8 degrees C (36 and 46 degrees F). Do not freeze. Do not shake. Throw away any unused portion if using a single-dose vial. Throw away any unused medicine after the expiration date. NOTE: This sheet is a summary. It may not cover all possible information. If you have questions about this medicine, talk to your doctor, pharmacist, or health care provider.  2015, [employer]. (2008-01-02 10:23:57)  

## 2014-09-02 LAB — IRON AND TIBC CHCC
%SAT: 37 % (ref 21–57)
Iron: 88 ug/dL (ref 41–142)
TIBC: 236 ug/dL (ref 236–444)
UIBC: 148 ug/dL (ref 120–384)

## 2014-09-02 LAB — FERRITIN CHCC: FERRITIN: 173 ng/mL (ref 9–269)

## 2014-09-03 ENCOUNTER — Ambulatory Visit: Payer: Medicare Other | Admitting: Neurology

## 2014-09-03 ENCOUNTER — Telehealth: Payer: Self-pay | Admitting: *Deleted

## 2014-09-03 NOTE — Telephone Encounter (Addendum)
Message left on personal voice mail  ----- Message from Volanda Napoleon, MD sent at 09/03/2014 12:19 PM EDT ----- Call her daughter and tell her that the iron level is actually pretty good. Thank you

## 2014-09-05 ENCOUNTER — Encounter: Payer: Self-pay | Admitting: Internal Medicine

## 2014-09-05 ENCOUNTER — Ambulatory Visit (INDEPENDENT_AMBULATORY_CARE_PROVIDER_SITE_OTHER): Payer: Medicare Other | Admitting: Internal Medicine

## 2014-09-05 ENCOUNTER — Telehealth: Payer: Self-pay | Admitting: *Deleted

## 2014-09-05 VITALS — BP 148/48 | HR 77 | Temp 98.3°F | Ht 60.0 in | Wt 123.0 lb

## 2014-09-05 DIAGNOSIS — G8929 Other chronic pain: Secondary | ICD-10-CM

## 2014-09-05 DIAGNOSIS — Z0000 Encounter for general adult medical examination without abnormal findings: Secondary | ICD-10-CM | POA: Diagnosis not present

## 2014-09-05 DIAGNOSIS — J309 Allergic rhinitis, unspecified: Secondary | ICD-10-CM

## 2014-09-05 DIAGNOSIS — E785 Hyperlipidemia, unspecified: Secondary | ICD-10-CM | POA: Diagnosis not present

## 2014-09-05 DIAGNOSIS — R350 Frequency of micturition: Secondary | ICD-10-CM | POA: Diagnosis not present

## 2014-09-05 DIAGNOSIS — E119 Type 2 diabetes mellitus without complications: Secondary | ICD-10-CM

## 2014-09-05 DIAGNOSIS — I10 Essential (primary) hypertension: Secondary | ICD-10-CM | POA: Diagnosis not present

## 2014-09-05 DIAGNOSIS — M542 Cervicalgia: Secondary | ICD-10-CM

## 2014-09-05 DIAGNOSIS — K219 Gastro-esophageal reflux disease without esophagitis: Secondary | ICD-10-CM

## 2014-09-05 MED ORDER — PANTOPRAZOLE SODIUM 40 MG PO TBEC: 40.0000 mg | DELAYED_RELEASE_TABLET | Freq: Every day | ORAL | Status: DC

## 2014-09-05 MED ORDER — SOLIFENACIN SUCCINATE 5 MG PO TABS: 5.0000 mg | ORAL_TABLET | Freq: Every day | ORAL | Status: DC

## 2014-09-05 MED ORDER — FEXOFENADINE HCL 180 MG PO TABS: 180.0000 mg | ORAL_TABLET | Freq: Every day | ORAL | Status: DC

## 2014-09-05 MED ORDER — CLINDAMYCIN HCL 150 MG PO CAPS: ORAL_CAPSULE | ORAL | Status: DC

## 2014-09-05 MED ORDER — HYDRALAZINE HCL 25 MG PO TABS: 25.0000 mg | ORAL_TABLET | Freq: Three times a day (TID) | ORAL | Status: DC

## 2014-09-05 NOTE — Progress Notes (Signed)
Pre visit review using our clinic review tool, if applicable. No additional management support is needed unless otherwise documented below in the visit note. 

## 2014-09-05 NOTE — Assessment & Plan Note (Signed)
Ok to change current 3 meds to protonix 40 qd

## 2014-09-05 NOTE — Progress Notes (Signed)
Subjective:    Patient ID: Allison Page, female    DOB: Apr 16, 1922, 81 y.o.   MRN: [id number]  HPI  Here to establish as new pt.; lives in assisted living where meds are administered to her, Pt denies chest pain, increased sob or doe, wheezing, orthopnea, PND, increased LE swelling, palpitations, dizziness or syncope.  Pt denies new neurological symptoms such as new headache, or facial or extremity weakness or numbness   Pt denies polydipsia, polyuria, or low sugar symptoms such as weakness or confusion improved with po intake.  Pt states overall good compliance with meds, trying to follow lower cholesterol, diabetic diet, wt overall stable but little exercise however.     Pt continues to have recurring left LBP and left pain without change in severity, no bowel or bladder change, fever, wt loss,  worsening LE pain/numbness/weakness, gait change or falls, with pain controlled with ultram.  Does have several wks ongoing nasal allergy symptoms with clearish congestion, itch and sneezing, without fever, pain, ST, cough, swelling or wheezing Denies urinary symptoms such as dysuria, urgency, flank pain, hematuria or n/v, fever, chills but has increased daytime urinary freq and nocturia for many months.  On high dose hydralazine 100 qd only ?  Not clear why once daily high dose being prescribed, has hx of recent syncope of unclear etiology.  Denies worsening reflux, abd pain, dysphagia, n/v, bowel change or blood, but does not want 3 meds (prilosec, tums and zantac for one problem) Past Medical History  Diagnosis Date  . Iron deficiency anemia 03/21/2014  . Anemia of chronic renal failure, stage 3 (moderate) 03/21/2014  . High blood pressure   . Acid reflux   . TIA (transient ischemic attack)   . Diabetes   . High cholesterol   . Hypothyroidism   . Arthritis    Past Surgical History  Procedure Laterality Date  . Total hip arthroplasty Left 05/06/2008  . Cholecystectomy  2013  . Vaginal hysterectomy     . Ercp  2014  . Colonoscopy  05/02/2007  . Bladder suspension    . Anterior (cystocele) and posterior repair (rectocele) with xenform graft and sacrospinous fixation    . Cataract extraction    . Breast biopsy      reports that she quit smoking about 44 years ago. Her smoking use included Cigarettes. She has a 25 pack-year smoking history. She has never used smokeless tobacco. She reports that she does not drink alcohol or use illicit drugs. family history includes Arthritis in her mother; Cancer in her daughter and sister; Heart attack in her father; Seizures in her daughter. Allergies  Allergen Reactions  . Amoxicillin   . Penicillins   . Sulfa Antibiotics    Current Outpatient Prescriptions on File Prior to Visit  Medication Sig Dispense Refill  . aluminum-magnesium hydroxide-simethicone (MAALOX) [phone] MG/5ML SUSP 2 teaspoons by mouth 3 times daily as needed for heartburn    . amLODipine (NORVASC) 5 MG tablet Take 5 mg by mouth daily.    Marland Kitchen aspirin 81 MG tablet Take 81 mg by mouth daily.    Marland Kitchen atorvastatin (LIPITOR) 20 MG tablet Take 20 mg by mouth daily at 6 PM.    . cholecalciferol (VITAMIN D) 1000 UNITS tablet Take 1,000 Units by mouth daily.    Marland Kitchen guaiFENesin (MUCINEX) 600 MG 12 hr tablet Take 600 mg by mouth 2 (two) times daily. As needed for cough and congestion    . hydrALAZINE (APRESOLINE) 100 MG tablet Take  100 mg by mouth daily.    Marland Kitchen levothyroxine (SYNTHROID, LEVOTHROID) 100 MCG tablet Take 100 mcg by mouth daily before breakfast.    . magnesium oxide (MAG-OX) 400 MG tablet Take 400 mg by mouth daily.    . Melatonin 3 MG TABS Take by mouth. 2 by mouth at bedtime    . senna-docusate (SENOKOT-S) 8.6-50 MG per tablet Take 1-2 tablets, IF NEEDED, every 12 hrs for constipation 60 tablet 4  . traMADol (ULTRAM) 50 MG tablet Take one tablet by mouth twice daily for pain 60 tablet 5  . vitamin B-12 (CYANOCOBALAMIN) 1000 MCG tablet Take 1,000 mcg by mouth daily.     No current  facility-administered medications on file prior to visit.    Review of Systems Constitutional: Negative for increased diaphoresis, other activity, appetite or siginficant weight change other than noted HENT: Negative for worsening hearing loss, ear pain, facial swelling, mouth sores and neck stiffness.   Eyes: Negative for other worsening pain, redness or visual disturbance.  Respiratory: Negative for shortness of breath and wheezing  Cardiovascular: Negative for chest pain and palpitations.  Gastrointestinal: Negative for diarrhea, blood in stool, abdominal distention or other pain Genitourinary: Negative for hematuria, flank pain or change in urine volume.  Musculoskeletal: Negative for myalgias or other joint complaints.  Skin: Negative for color change and wound or drainage.  Neurological: Negative for syncope and numbness. other than noted Hematological: Negative for adenopathy. or other swelling Psychiatric/Behavioral: Negative for hallucinations, SI, self-injury, decreased concentration or other worsening agitation.      Objective:   Physical Exam BP 148/48 mmHg  Pulse 77  Temp(Src) 98.3 F (36.8 C) (Oral)  Ht 5' (1.524 m)  Wt 123 lb (55.792 kg)  BMI 24.02 kg/m2  SpO2 97% VS noted,  Constitutional: Pt is oriented to person, place, and time. Appears well-developed and well-nourished, in no significant distress Head: Normocephalic and atraumatic.  Right Ear: External ear normal.  Left Ear: External ear normal.  Nose: Nose normal.  Mouth/Throat: Oropharynx is clear and moist.  Eyes: Conjunctivae and EOM are normal. Pupils are equal, round, and reactive to light.  Neck: Normal range of motion. Neck supple. No JVD present. No tracheal deviation present or significant neck LA or mass Cardiovascular: Normal rate, regular rhythm, normal heart sounds and intact distal pulses.   Pulmonary/Chest: Effort normal and breath sounds without rales or wheezing  Abdominal: Soft. Bowel  sounds are normal. NT. No HSM  Musculoskeletal: Normal range of motion. Exhibits no edema.  Lymphadenopathy:  Has no cervical adenopathy.  Neurological: Pt is alert and oriented to person, place, and time. Pt has normal reflexes. No cranial nerve deficit. Motor grossly intact Skin: Skin is warm and dry. No rash noted.  Psychiatric:  Has normal mood and affect. Behavior is normal.      Assessment & Plan:

## 2014-09-05 NOTE — Assessment & Plan Note (Signed)
Ok to change the hydralazine to 25 tid,  to f/u any worsening symptoms or concerns BP Readings from Last 3 Encounters:  09/05/14 148/48  08/30/14 142/50  07/19/14 135/49

## 2014-09-05 NOTE — Assessment & Plan Note (Signed)
Stable, cont current tx 

## 2014-09-05 NOTE — Telephone Encounter (Signed)
Received call from pharmacist wanting to verify ranitidine & hydralazine. Pt was taking 100 mg TID before. Inform pharmacist pt saw md today pt is to d/c ranitidine & omeprazole new script is protonix. Also hydralazine was change to 25 mg TID...Allison Page

## 2014-09-05 NOTE — Assessment & Plan Note (Signed)
stable overall by history and exam, recent data reviewed with pt, and pt to continue medical treatment as before,  to f/u any worsening symptoms or concerns Lab Results  Component Value Date   HGBA1C 6.2* 03/29/2014

## 2014-09-05 NOTE — Assessment & Plan Note (Signed)
C/w prob OAB - for vesicare 5 mg daily,  to f/u any worsening symptoms or concerns

## 2014-09-05 NOTE — Assessment & Plan Note (Signed)
/

## 2014-09-05 NOTE — Patient Instructions (Addendum)
Ok to stop the omeprazole, TUMS and zantac, as well as the hydralazine 100 mg  Please take all new medication as prescribed - the protonix 40 mg per day for reflux, the hydralazine at 25 mg three times per day for blood pressure, allegra for allergies, and vesicare 5 mg per day for bladder  You can also take OTC Miralax 17 mg per day for constipatoin  Please continue all other medications as before, including the melatonin and tramadol  Please have the pharmacy call with any other refills you may need.  Please continue your efforts at being more active, low cholesterol diet, and weight control.  You are otherwise up to date with prevention measures today.  Please keep your appointments with your specialists as you may have planned  Please return in 6 months, or sooner if needed

## 2014-09-19 ENCOUNTER — Telehealth: Payer: Self-pay | Admitting: Internal Medicine

## 2014-09-19 DIAGNOSIS — R1310 Dysphagia, unspecified: Secondary | ICD-10-CM

## 2014-09-19 DIAGNOSIS — R130 Aphagia: Secondary | ICD-10-CM

## 2014-09-19 NOTE — Telephone Encounter (Signed)
Pt having a hard time swallowing and is wondering if it could be from one of her new medications Please call pt

## 2014-09-20 NOTE — Telephone Encounter (Signed)
Very unlikely, so can I refer to GI?  This would be the next step to see if anything wrong with the stomach or esophagus?

## 2014-09-20 NOTE — Telephone Encounter (Signed)
Please advise, pt was seen 09/05/2014 and was told; Please take all new medication as prescribed - the protonix 40 mg per day for reflux, the hydralazine at 25 mg three times per day for blood pressure, allegra for allergies, and vesicare 5 mg per day for bladder Could any of these medications cause pt's sxs?

## 2014-09-24 ENCOUNTER — Encounter: Payer: Self-pay | Admitting: Internal Medicine

## 2014-09-24 NOTE — Telephone Encounter (Signed)
Pt's daughter says that pt "cannot swallow" and she is requesting a STAT referral to Dr Henrene Pastor in GI. Daughter was advised that if patient cannot swallow she should be evaluated in the ER ASAP, daughter declined and is requesting referral as she believes that patients issue is related to D/C of omeprazole at last OV, please advise

## 2014-09-24 NOTE — Telephone Encounter (Signed)
Pt's daughter advised.  

## 2014-09-26 ENCOUNTER — Encounter: Payer: Self-pay | Admitting: Gastroenterology

## 2014-09-26 ENCOUNTER — Ambulatory Visit (INDEPENDENT_AMBULATORY_CARE_PROVIDER_SITE_OTHER): Payer: Medicare Other | Admitting: Gastroenterology

## 2014-09-26 VITALS — BP 140/60 | HR 72 | Ht 60.0 in | Wt 121.5 lb

## 2014-09-26 DIAGNOSIS — F458 Other somatoform disorders: Secondary | ICD-10-CM | POA: Diagnosis not present

## 2014-09-26 DIAGNOSIS — R0989 Other specified symptoms and signs involving the circulatory and respiratory systems: Secondary | ICD-10-CM

## 2014-09-26 NOTE — Progress Notes (Signed)
HPI: This is a  very pleasant 81 year old woman   who was referred to me by Biagio Borg, MD  to evaluate  dysphasia .    Chief complaint is dry mouth, globus  She is here with her daughter today who is very helpful with the history.  2-3 days ago she awoke with a knot like sensation in her upper throat.   For a week prior she had solid food dysphagia.    Memory problems, daughter is helping.  This is a new issue for her.  Had been taking prilosec for a long time also h2 blocker bid, about 1-2 weeks ago this was changed to protonix.  2-3 weeks ago she was also put on Allegra for some throat congestion  No antibioitcs in past few months.   Review of systems: Pertinent positive and negative review of systems were noted in the above HPI section. Complete review of systems was performed and was otherwise normal.   Past Medical History  Diagnosis Date  . Iron deficiency anemia 03/21/2014  . Anemia of chronic renal failure, stage 3 (moderate) 03/21/2014  . High blood pressure   . Acid reflux   . TIA (transient ischemic attack)   . Diabetes   . High cholesterol   . Hypothyroidism   . Arthritis     Past Surgical History  Procedure Laterality Date  . Total hip arthroplasty Left 05/06/2008  . Cholecystectomy  2013  . Vaginal hysterectomy    . Ercp  2014  . Colonoscopy  05/02/2007  . Bladder suspension    . Anterior (cystocele) and posterior repair (rectocele) with xenform graft and sacrospinous fixation    . Cataract extraction    . Breast biopsy      Current Outpatient Prescriptions  Medication Sig Dispense Refill  . aluminum-magnesium hydroxide-simethicone (MAALOX) 397-673-41 MG/5ML SUSP 2 teaspoons by mouth 3 times daily as needed for heartburn    . amLODipine (NORVASC) 5 MG tablet Take 5 mg by mouth daily.    Marland Kitchen aspirin 81 MG tablet Take 81 mg by mouth daily.    Marland Kitchen atorvastatin (LIPITOR) 20 MG tablet Take 20 mg by mouth daily at 6 PM.    . cholecalciferol (VITAMIN D)  1000 UNITS tablet Take 1,000 Units by mouth daily.    . clindamycin (CLEOCIN) 150 MG capsule 4 by mouth 1 hour prior to dental procedures 4 capsule 0  . fexofenadine (ALLEGRA) 180 MG tablet Take 1 tablet (180 mg total) by mouth daily. 90 tablet 3  . guaiFENesin (MUCINEX) 600 MG 12 hr tablet Take 600 mg by mouth 2 (two) times daily. As needed for cough and congestion    . hydrALAZINE (APRESOLINE) 25 MG tablet Take 1 tablet (25 mg total) by mouth 3 (three) times daily. 90 tablet 11  . levothyroxine (SYNTHROID, LEVOTHROID) 100 MCG tablet Take 100 mcg by mouth daily before breakfast.    . magnesium oxide (MAG-OX) 400 MG tablet Take 400 mg by mouth daily.    . Melatonin 3 MG TABS Take by mouth. 2 by mouth at bedtime    . pantoprazole (PROTONIX) 40 MG tablet Take 1 tablet (40 mg total) by mouth daily. 90 tablet 3  . senna-docusate (SENOKOT-S) 8.6-50 MG per tablet Take 1-2 tablets, IF NEEDED, every 12 hrs for constipation 60 tablet 4  . solifenacin (VESICARE) 5 MG tablet Take 1 tablet (5 mg total) by mouth daily. 90 tablet 3  . traMADol (ULTRAM) 50 MG tablet Take one tablet by mouth  twice daily for pain 60 tablet 5  . vitamin B-12 (CYANOCOBALAMIN) 1000 MCG tablet Take 1,000 mcg by mouth daily.     No current facility-administered medications for this visit.    Allergies as of 09/26/2014 - Review Complete 09/26/2014  Allergen Reaction Noted  . Amoxicillin  03/22/2014  . Penicillins  03/04/2014  . Sulfa antibiotics  03/04/2014    Family History  Problem Relation Age of Onset  . Heart attack Father   . Cancer Sister   . Esophageal cancer Daughter   . Arthritis Mother   . Seizures Daughter     As a teenager  . Colon cancer Son     Social History   Social History  . Marital Status: Unknown    Spouse Name: N/A  . Number of Children: 3  . Years of Education: N/A   Occupational History  . retired    Social History Main Topics  . Smoking status: Former Smoker -- 1.00 packs/day for 25  years    Types: Cigarettes    Quit date: 04/11/1970  . Smokeless tobacco: Never Used     Comment: quit 42 years ago  . Alcohol Use: No  . Drug Use: No  . Sexual Activity: Not on file   Other Topics Concern  . Not on file   Social History Narrative   Diet: Diabetic diet   Caffeine:no    Married: widowed   House: Assisted Living   Pets: No   Current/Past profession: Housewife   Exercise: No   Living Will: Yes   DNR: Yes   POA/HPOA: N/A           Physical Exam: BP 140/60 mmHg  Pulse 72  Ht 5' (1.524 m)  Wt 121 lb 8 oz (55.112 kg)  BMI 23.73 kg/m2 Constitutional: generally well-appearing Psychiatric: alert and oriented x3 Eyes: extraocular movements intact Mouth: oral pharynx moist, no lesions Neck: supple no lymphadenopathy Cardiovascular: heart regular rate and rhythm Lungs: clear to auscultation bilaterally Abdomen: soft, nontender, nondistended, no obvious ascites, no peritoneal signs, normal bowel sounds Extremities: no lower extremity edema bilaterally Skin: no lesions on visible extremities   Assessment and plan: 81 y.o. female with  dry mouth, globus, recent dysphagia  Her daughter was very helpful today with her history. Her symptoms do seem to start around the time of some medication adjustments about 2 or 3 weeks ago. Her antiacid medicines were adjusted and I don't think that would cause any real disturbance in her acid control. She was put on an allergy medicine and this perhaps could cause some dry mouth and resultant dysphagia, globus sensation. I recommend she stop that anti-histamine for now and to call back to report on her response in 3 or 4 weeks, sooner if she worsens. If she does worsen or does not improve then I think the next test would be a barium esophagram prior to committing her to endoscopic evaluation.   Owens Loffler, MD [facility] 09/26/2014, 2:45 PM  Cc: Biagio Borg, MD

## 2014-09-26 NOTE — Patient Instructions (Signed)
Stop the allegra (removed from your medicine list). Please call in 3-4 weeks to report on your symptoms, sooner if things worsen.

## 2014-10-04 ENCOUNTER — Other Ambulatory Visit (HOSPITAL_BASED_OUTPATIENT_CLINIC_OR_DEPARTMENT_OTHER): Payer: Medicare Other

## 2014-10-04 ENCOUNTER — Encounter: Payer: Self-pay | Admitting: Hematology & Oncology

## 2014-10-04 ENCOUNTER — Ambulatory Visit (HOSPITAL_BASED_OUTPATIENT_CLINIC_OR_DEPARTMENT_OTHER): Payer: Medicare Other | Admitting: Hematology & Oncology

## 2014-10-04 ENCOUNTER — Ambulatory Visit: Payer: Medicare Other

## 2014-10-04 VITALS — BP 157/63 | HR 74 | Temp 97.9°F | Resp 16 | Ht 60.0 in | Wt 121.0 lb

## 2014-10-04 DIAGNOSIS — D509 Iron deficiency anemia, unspecified: Secondary | ICD-10-CM

## 2014-10-04 DIAGNOSIS — N183 Chronic kidney disease, stage 3 unspecified: Secondary | ICD-10-CM

## 2014-10-04 DIAGNOSIS — D649 Anemia, unspecified: Secondary | ICD-10-CM

## 2014-10-04 DIAGNOSIS — N289 Disorder of kidney and ureter, unspecified: Secondary | ICD-10-CM | POA: Diagnosis present

## 2014-10-04 DIAGNOSIS — D631 Anemia in chronic kidney disease: Secondary | ICD-10-CM

## 2014-10-04 LAB — MANUAL DIFFERENTIAL (CHCC SATELLITE)
ALC: 0.9 10*3/uL (ref 0.6–2.2)
ANC (CHCC MAN DIFF): 1.8 10*3/uL (ref 1.5–6.7)
BASO: 1 % (ref 0–2)
EOS: 4 % (ref 0–7)
LYMPH: 29 % (ref 14–48)
MONO: 4 % (ref 0–13)
PLT EST ~~LOC~~: ADEQUATE
SEG: 62 % (ref 40–75)

## 2014-10-04 LAB — CBC WITH DIFFERENTIAL (CANCER CENTER ONLY)
HCT: 34.3 % — ABNORMAL LOW (ref 34.8–46.6)
HGB: 10.9 g/dL — ABNORMAL LOW (ref 11.6–15.9)
MCH: 31.3 pg (ref 26.0–34.0)
MCHC: 31.8 g/dL — ABNORMAL LOW (ref 32.0–36.0)
MCV: 99 fL (ref 81–101)
PLATELETS: 307 10*3/uL (ref 145–400)
RBC: 3.48 10*6/uL — ABNORMAL LOW (ref 3.70–5.32)
RDW: 13.3 % (ref 11.1–15.7)
WBC: 3 10*3/uL — AB (ref 3.9–10.0)

## 2014-10-04 LAB — CMP (CANCER CENTER ONLY)
ALT(SGPT): 10 U/L (ref 10–47)
AST: 19 U/L (ref 11–38)
Albumin: 3.5 g/dL (ref 3.3–5.5)
Alkaline Phosphatase: 59 U/L (ref 26–84)
BUN: 25 mg/dL — AB (ref 7–22)
CHLORIDE: 106 meq/L (ref 98–108)
CO2: 25 mEq/L (ref 18–33)
CREATININE: 1.8 mg/dL — AB (ref 0.6–1.2)
Calcium: 10.8 mg/dL — ABNORMAL HIGH (ref 8.0–10.3)
Glucose, Bld: 122 mg/dL — ABNORMAL HIGH (ref 73–118)
Potassium: 4.5 mEq/L (ref 3.3–4.7)
SODIUM: 137 meq/L (ref 128–145)
TOTAL PROTEIN: 7.2 g/dL (ref 6.4–8.1)
Total Bilirubin: 0.4 mg/dl (ref 0.20–1.60)

## 2014-10-04 LAB — RETICULOCYTES (CHCC)
ABS RETIC: 24.7 10*3/uL (ref 19.0–186.0)
RBC.: 3.53 MIL/uL — ABNORMAL LOW (ref 3.87–5.11)
Retic Ct Pct: 0.7 % (ref 0.4–2.3)

## 2014-10-04 NOTE — Progress Notes (Signed)
Hematology and Oncology Follow Up Visit  Allison Page [id number] 13-Oct-1922 81 y.o. 10/04/2014   Principle Diagnosis:   Anemia of renal insufficiency  Iron deficiency anemia  Current Therapy:    IV iron as indicated  Aranesp 300 g subcutaneous for hemoglobin less than 10.     Interim History:  Allison Page is back for follow-up.  She is doing quite well. She has had a nice summer.  She is eating okay. She's having no problems with nausea or vomiting. She's had no issues with pain.  There's been no change in bowel or bladder habits. She does need a laxative.   We'll last saw her,  Her ferritin was 173 with an iron saturation of 37%.   Last time we gave her Aranesp was back in  July.  Overall, her performance status is ECOG 2.   Medications:  Current outpatient prescriptions:  .  aluminum-magnesium hydroxide-simethicone (MAALOX) [phone] MG/5ML SUSP, 2 teaspoons by mouth 3 times daily as needed for heartburn, Disp: , Rfl:  .  amLODipine (NORVASC) 5 MG tablet, Take 5 mg by mouth daily., Disp: , Rfl:  .  aspirin 81 MG tablet, Take 81 mg by mouth daily., Disp: , Rfl:  .  atorvastatin (LIPITOR) 20 MG tablet, Take 20 mg by mouth daily at 6 PM., Disp: , Rfl:  .  cholecalciferol (VITAMIN D) 1000 UNITS tablet, Take 1,000 Units by mouth daily., Disp: , Rfl:  .  clindamycin (CLEOCIN) 150 MG capsule, 4 by mouth 1 hour prior to dental procedures, Disp: 4 capsule, Rfl: 0 .  guaiFENesin (MUCINEX) 600 MG 12 hr tablet, Take 600 mg by mouth 2 (two) times daily. As needed for cough and congestion, Disp: , Rfl:  .  hydrALAZINE (APRESOLINE) 25 MG tablet, Take 1 tablet (25 mg total) by mouth 3 (three) times daily., Disp: 90 tablet, Rfl: 11 .  levothyroxine (SYNTHROID, LEVOTHROID) 100 MCG tablet, Take 100 mcg by mouth daily before breakfast., Disp: , Rfl:  .  magnesium oxide (MAG-OX) 400 MG tablet, Take 400 mg by mouth daily., Disp: , Rfl:  .  Melatonin 3 MG TABS, Take by mouth. 2 by mouth  at bedtime, Disp: , Rfl:  .  pantoprazole (PROTONIX) 40 MG tablet, Take 1 tablet (40 mg total) by mouth daily., Disp: 90 tablet, Rfl: 3 .  senna-docusate (SENOKOT-S) 8.6-50 MG per tablet, Take 1-2 tablets, IF NEEDED, every 12 hrs for constipation, Disp: 60 tablet, Rfl: 4 .  solifenacin (VESICARE) 5 MG tablet, Take 1 tablet (5 mg total) by mouth daily., Disp: 90 tablet, Rfl: 3 .  traMADol (ULTRAM) 50 MG tablet, Take one tablet by mouth twice daily for pain, Disp: 60 tablet, Rfl: 5 .  vitamin B-12 (CYANOCOBALAMIN) 1000 MCG tablet, Take 1,000 mcg by mouth daily., Disp: , Rfl:   Allergies:  Allergies  Allergen Reactions  . Allegra Allergy [Fexofenadine Hcl] Anaphylaxis  . Amoxicillin   . Penicillins   . Sulfa Antibiotics     Past Medical History, Surgical history, Social history, and Family History were reviewed and updated.  Review of Systems: As above  Physical Exam:  height is 5' (1.524 m) and weight is 121 lb (54.885 kg). Her oral temperature is 97.9 F (36.6 C). Her blood pressure is 157/63 and her pulse is 74. Her respiration is 16.   Wt Readings from Last 3 Encounters:  10/04/14 121 lb (54.885 kg)  09/26/14 121 lb 8 oz (55.112 kg)  09/05/14 123 lb (55.792 kg)  Elderly white female in no obvious distress. Head and neck exam shows no ocular or oral lesions. There are no palpable cervical or supraclavicular lymph nodes. Lungs are clear. Cardiac exam regular rate and rhythm with no murmurs, rubs or bruits. Abdomen is soft. She has good bowel sounds. There may be a little bit of distention. There is no fluid wave. There is no palpable liver or spleen tip. Extremities shows some trace edema in her legs. She has some age-related osteoarthritic changes in her joints. She has 4+/5 strength in her extremities. Skin exam shows no rashes, ecchymoses or petechia. Neurological exam shows no focal neurological deficits.  Lab Results  Component Value Date   WBC 3.0* 10/04/2014   HGB 10.9*  10/04/2014   HCT 34.3* 10/04/2014   MCV 99 10/04/2014   PLT 307 10/04/2014     Chemistry      Component Value Date/Time   NA 137 10/04/2014 1333   NA 135 07/05/2014 1622   K 4.5 10/04/2014 1333   K 4.4 07/05/2014 1622   CL 106 10/04/2014 1333   CL 98 07/05/2014 1622   CO2 25 10/04/2014 1333   CO2 22 07/05/2014 1622   BUN 25* 10/04/2014 1333   BUN 24 07/05/2014 1622   CREATININE 1.8* 10/04/2014 1333   CREATININE 1.69* 07/05/2014 1622      Component Value Date/Time   CALCIUM 10.8* 10/04/2014 1333   CALCIUM 11.1* 07/05/2014 1622   ALKPHOS 59 10/04/2014 1333   ALKPHOS 65 07/05/2014 1622   AST 19 10/04/2014 1333   AST 12 07/05/2014 1622   ALT 10 10/04/2014 1333   ALT 7 07/05/2014 1622   BILITOT 0.40 10/04/2014 1333   BILITOT 0.2 07/05/2014 1622         Impression and Plan: Allison Page is 81 year old white female.    today, she does not need any Aranesp. I would not think that she will need any iron.    we will plan to get her back in 2 months now. I think this would be very reasonable. I'm sure that we will have to have her administered Aranesp we see her back. I want to make sure that we have her blood as optimal as possible for the holidays.   Volanda Napoleon, MD 9/2/20162:17 PM

## 2014-10-08 LAB — IRON AND TIBC CHCC
%SAT: 37 % (ref 21–57)
Iron: 85 ug/dL (ref 41–142)
TIBC: 230 ug/dL — ABNORMAL LOW (ref 236–444)
UIBC: 145 ug/dL (ref 120–384)

## 2014-10-08 LAB — FERRITIN CHCC: Ferritin: 120 ng/ml (ref 9–269)

## 2014-11-05 ENCOUNTER — Encounter: Payer: Self-pay | Admitting: Hematology & Oncology

## 2014-11-05 ENCOUNTER — Encounter: Payer: Self-pay | Admitting: Internal Medicine

## 2014-11-05 NOTE — Telephone Encounter (Signed)
[place] for this, Done hardcopy to [place]'

## 2014-11-14 ENCOUNTER — Encounter: Payer: Self-pay | Admitting: Internal Medicine

## 2014-11-14 NOTE — Telephone Encounter (Signed)
Tammy,   Ok to call pt's POA, needs OV

## 2014-11-15 ENCOUNTER — Telehealth: Payer: Self-pay | Admitting: Internal Medicine

## 2014-11-15 NOTE — Telephone Encounter (Signed)
I have left a voice mail for daughter to call our office back to set an appointment up for patient per Dr. Jenny Reichmann message from email.  I have also responded back to the email asking the daughter to contact the office to make an appointment.

## 2014-11-19 ENCOUNTER — Ambulatory Visit (INDEPENDENT_AMBULATORY_CARE_PROVIDER_SITE_OTHER): Payer: Medicare Other | Admitting: Internal Medicine

## 2014-11-19 ENCOUNTER — Encounter: Payer: Self-pay | Admitting: Internal Medicine

## 2014-11-19 VITALS — BP 122/72 | HR 68 | Temp 98.1°F | Wt 118.0 lb

## 2014-11-19 DIAGNOSIS — K5900 Constipation, unspecified: Secondary | ICD-10-CM | POA: Insufficient documentation

## 2014-11-19 DIAGNOSIS — M25511 Pain in right shoulder: Secondary | ICD-10-CM

## 2014-11-19 DIAGNOSIS — I10 Essential (primary) hypertension: Secondary | ICD-10-CM

## 2014-11-19 DIAGNOSIS — R413 Other amnesia: Secondary | ICD-10-CM | POA: Diagnosis not present

## 2014-11-19 DIAGNOSIS — E119 Type 2 diabetes mellitus without complications: Secondary | ICD-10-CM | POA: Diagnosis not present

## 2014-11-19 DIAGNOSIS — F0390 Unspecified dementia without behavioral disturbance: Secondary | ICD-10-CM | POA: Insufficient documentation

## 2014-11-19 DIAGNOSIS — E034 Atrophy of thyroid (acquired): Secondary | ICD-10-CM

## 2014-11-19 DIAGNOSIS — E038 Other specified hypothyroidism: Secondary | ICD-10-CM | POA: Diagnosis not present

## 2014-11-19 HISTORY — DX: Unspecified dementia, unspecified severity, without behavioral disturbance, psychotic disturbance, mood disturbance, and anxiety: F03.90

## 2014-11-19 NOTE — Progress Notes (Signed)
Subjective:    Patient ID: Allison Page, female    DOB: 13-Mar-1922, 81 y.o.   MRN: [id number]  HPI  Here with memory issues, hard to find words in palst 4 mo, now also 2 wks of more obvious problems with remembering the phone use, TV. Microwave , and taking meds (but they always assisted with this at the assisted living.)  No fever, no falls or head trauma. Does have some urinary freq, hx of OAB, but maybe worse recently, but no other GU symtpoms such as n/v, hematuria, or flank pain.  Going to start miralax daily for constipation.   States lower appetite but wt down 3 lbs. Wt Readings from Last 3 Encounters:  11/19/14 118 lb (53.524 kg)  10/04/14 121 lb (54.885 kg)  09/26/14 121 lb 8 oz (55.112 kg)  Pt denies new neurological symptoms such as new headache, or facial or extremity weakness or numbness.  Has some right shoulder pain with ROM. keesp her awake at night. Overall feels she gets adequate sleep, gets sleeping pill.  Pt denies chest pain, increased sob or doe, wheezing, orthopnea, PND, increased LE swelling, palpitations, dizziness or syncope.Walks with walker.  No falls Past Medical History  Diagnosis Date  . Iron deficiency anemia 03/21/2014  . Anemia of chronic renal failure, stage 3 (moderate) 03/21/2014  . High blood pressure   . Acid reflux   . TIA (transient ischemic attack)   . Diabetes ([place])   . High cholesterol   . Hypothyroidism   . Arthritis    Past Surgical History  Procedure Laterality Date  . Total hip arthroplasty Left 05/06/2008  . Cholecystectomy  2013  . Vaginal hysterectomy    . Ercp  2014  . Colonoscopy  05/02/2007  . Bladder suspension    . Anterior (cystocele) and posterior repair (rectocele) with xenform graft and sacrospinous fixation    . Cataract extraction    . Breast biopsy      reports that she quit smoking about 44 years ago. Her smoking use included Cigarettes. She has a 25 pack-year smoking history. She has never used smokeless tobacco. She  reports that she does not drink alcohol or use illicit drugs. family history includes Arthritis in her mother; Cancer in her sister; Colon cancer in her son; Esophageal cancer in her daughter; Heart attack in her father; Seizures in her daughter. Allergies  Allergen Reactions  . Allegra Allergy [Fexofenadine Hcl] Anaphylaxis  . Amoxicillin   . Penicillins   . Sulfa Antibiotics    Current Outpatient Prescriptions on File Prior to Visit  Medication Sig Dispense Refill  . aluminum-magnesium hydroxide-simethicone (MAALOX) [phone] MG/5ML SUSP 2 teaspoons by mouth 3 times daily as needed for heartburn    . amLODipine (NORVASC) 5 MG tablet Take 5 mg by mouth daily.    Marland Kitchen aspirin 81 MG tablet Take 81 mg by mouth daily.    Marland Kitchen atorvastatin (LIPITOR) 20 MG tablet Take 20 mg by mouth daily at 6 PM.    . cholecalciferol (VITAMIN D) 1000 UNITS tablet Take 1,000 Units by mouth daily.    . clindamycin (CLEOCIN) 150 MG capsule 4 by mouth 1 hour prior to dental procedures 4 capsule 0  . guaiFENesin (MUCINEX) 600 MG 12 hr tablet Take 600 mg by mouth 2 (two) times daily. As needed for cough and congestion    . hydrALAZINE (APRESOLINE) 25 MG tablet Take 1 tablet (25 mg total) by mouth 3 (three) times daily. 90 tablet 11  .  levothyroxine (SYNTHROID, LEVOTHROID) 100 MCG tablet Take 100 mcg by mouth daily before breakfast.    . magnesium oxide (MAG-OX) 400 MG tablet Take 400 mg by mouth daily.    . Melatonin 3 MG TABS Take by mouth. 2 by mouth at bedtime    . pantoprazole (PROTONIX) 40 MG tablet Take 1 tablet (40 mg total) by mouth daily. 90 tablet 3  . senna-docusate (SENOKOT-S) 8.6-50 MG per tablet Take 1-2 tablets, IF NEEDED, every 12 hrs for constipation 60 tablet 4  . solifenacin (VESICARE) 5 MG tablet Take 1 tablet (5 mg total) by mouth daily. 90 tablet 3  . traMADol (ULTRAM) 50 MG tablet Take one tablet by mouth twice daily for pain 60 tablet 5  . vitamin B-12 (CYANOCOBALAMIN) 1000 MCG tablet Take 1,000  mcg by mouth daily.     No current facility-administered medications on file prior to visit.   Review of Systems  Constitutional: Negative for unusual diaphoresis or night sweats HENT: Negative for ringing in ear or discharge Eyes: Negative for double vision or worsening visual disturbance.  Respiratory: Negative for choking and stridor.   Gastrointestinal: Negative for vomiting or other signifcant bowel change Genitourinary: Negative for hematuria or change in urine volume.  Musculoskeletal: Negative for other MSK pain or swelling Skin: Negative for color change and worsening wound.  Neurological: Negative for tremors and numbness other than noted  Psychiatric/Behavioral: Negative for decreased concentration or agitation other than above       Objective:   Physical Exam BP 122/72 mmHg  Pulse 68  Temp(Src) 98.1 F (36.7 C)  Wt 118 lb (53.524 kg)  SpO2 96% VS noted,  Constitutional: Pt appears in no significant distress HENT: Head: NCAT.  Right Ear: External ear normal.  Left Ear: External ear normal.  Eyes: . Pupils are equal, round, and reactive to light. Conjunctivae and EOM are normal Neck: Normal range of motion. Neck supple.  Cardiovascular: Normal rate and regular rhythm.   Pulmonary/Chest: Effort normal and breath sounds without rales or wheezing.  Abd:  Soft, NT, ND, + BS, no flank pain Neurological: Pt is alert. + confused , motor grossly intact, MMSE 16/30 Skin: Skin is warm. No rash, no LE edema Psychiatric: Pt behavior is normal. No agitation.  Right shoulder with tender to right subacromial bursa, worse to abduct    Assessment & Plan:

## 2014-11-19 NOTE — Progress Notes (Signed)
Pre visit review using our clinic review tool, if applicable. No additional management support is needed unless otherwise documented below in the visit note. 

## 2014-11-19 NOTE — Assessment & Plan Note (Signed)
Ok for miralax daily 

## 2014-11-19 NOTE — Assessment & Plan Note (Addendum)
Etiology unclear, suspect some type of dementia, but also to d/c the vesicare for now as can be related, will check routine labs, as well as RPR, B12, esr, Head MRI, refer neurology per family request  Note:  Total time for pt hx, exam, review of record with pt in the room, determination of diagnoses and plan for further eval and tx is > 40 min, with over 50% spent in coordination and counseling of patient

## 2014-11-19 NOTE — Assessment & Plan Note (Signed)
stable overall by history and exam, recent data reviewed with pt, and pt to continue medical treatment as before,  to f/u any worsening symptoms or concerns Lab Results  Component Value Date   HGBA1C 6.2* 03/29/2014    

## 2014-11-19 NOTE — Assessment & Plan Note (Signed)
For f/u TSH given memory change,  to f/u any worsening symptoms or concerns Lab Results  Component Value Date   TSH 2.380 07/05/2014

## 2014-11-19 NOTE — Assessment & Plan Note (Signed)
stable overall by history and exam, recent data reviewed with pt, and pt to continue medical treatment as before,  to f/u any worsening symptoms or concerns BP Readings from Last 3 Encounters:  11/19/14 122/72  10/04/14 157/63  09/26/14 140/60

## 2014-11-19 NOTE — Assessment & Plan Note (Signed)
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

## 2014-11-19 NOTE — Patient Instructions (Signed)
OK to stop the vesicare  Please take all new medication as prescribed - the Miralax 17 gm by mouth every day with morning medications  Please continue all other medications as before, and refills have been done if requested.  Please have the pharmacy call with any other refills you may need.  You will be contacted regarding the referral for: MRI head, Neurology, and Dr Smith/sports medicine  Please keep your appointments with your specialists as you may have planned  Please go to the LAB in the Basement (turn left off the elevator) for the tests to be done tomorrow or when you are able  You will be contacted by phone if any changes need to be made immediately.  Otherwise, you will receive a letter about your results with an explanation, but please check with MyChart first.  Please remember to sign up for MyChart if you have not done so, as this will be important to you in the future with finding out test results, communicating by private email, and scheduling acute appointments online when needed.

## 2014-11-22 ENCOUNTER — Other Ambulatory Visit: Payer: Self-pay | Admitting: Internal Medicine

## 2014-11-22 ENCOUNTER — Other Ambulatory Visit (INDEPENDENT_AMBULATORY_CARE_PROVIDER_SITE_OTHER): Payer: Medicare Other

## 2014-11-22 DIAGNOSIS — R413 Other amnesia: Secondary | ICD-10-CM | POA: Diagnosis not present

## 2014-11-22 LAB — HEPATIC FUNCTION PANEL
ALT: 8 U/L (ref 0–35)
AST: 15 U/L (ref 0–37)
Albumin: 3.8 g/dL (ref 3.5–5.2)
Alkaline Phosphatase: 70 U/L (ref 39–117)
BILIRUBIN DIRECT: 0.1 mg/dL (ref 0.0–0.3)
BILIRUBIN TOTAL: 0.4 mg/dL (ref 0.2–1.2)
Total Protein: 7.2 g/dL (ref 6.0–8.3)

## 2014-11-22 LAB — CBC WITH DIFFERENTIAL/PLATELET
BASOS PCT: 0.3 % (ref 0.0–3.0)
Basophils Absolute: 0 10*3/uL (ref 0.0–0.1)
EOS PCT: 0.8 % (ref 0.0–5.0)
Eosinophils Absolute: 0 10*3/uL (ref 0.0–0.7)
HEMATOCRIT: 32.9 % — AB (ref 36.0–46.0)
Hemoglobin: 10.8 g/dL — ABNORMAL LOW (ref 12.0–15.0)
LYMPHS PCT: 34.1 % (ref 12.0–46.0)
Lymphs Abs: 1.1 10*3/uL (ref 0.7–4.0)
MCHC: 32.7 g/dL (ref 30.0–36.0)
MCV: 92.2 fl (ref 78.0–100.0)
MONOS PCT: 7.8 % (ref 3.0–12.0)
Monocytes Absolute: 0.3 10*3/uL (ref 0.1–1.0)
NEUTROS ABS: 1.9 10*3/uL (ref 1.4–7.7)
Neutrophils Relative %: 57 % (ref 43.0–77.0)
PLATELETS: 302 10*3/uL (ref 150.0–400.0)
RBC: 3.57 Mil/uL — ABNORMAL LOW (ref 3.87–5.11)
RDW: 14 % (ref 11.5–15.5)
WBC: 3.3 10*3/uL — ABNORMAL LOW (ref 4.0–10.5)

## 2014-11-22 LAB — BASIC METABOLIC PANEL
BUN: 21 mg/dL (ref 6–23)
CALCIUM: 11.3 mg/dL — AB (ref 8.4–10.5)
CHLORIDE: 101 meq/L (ref 96–112)
CO2: 28 mEq/L (ref 19–32)
CREATININE: 1.68 mg/dL — AB (ref 0.40–1.20)
GFR: 30.39 mL/min — AB (ref >60.00)
Glucose, Bld: 125 mg/dL — ABNORMAL HIGH (ref 70–99)
Potassium: 4 mEq/L (ref 3.5–5.1)
Sodium: 136 mEq/L (ref 135–145)

## 2014-11-22 LAB — URINALYSIS, ROUTINE W REFLEX MICROSCOPIC
BILIRUBIN URINE: NEGATIVE
Ketones, ur: NEGATIVE
Nitrite: NEGATIVE
Specific Gravity, Urine: 1.02 (ref 1.000–1.030)
Total Protein, Urine: 100 — AB
Urine Glucose: NEGATIVE
Urobilinogen, UA: 0.2 (ref 0.0–1.0)
pH: 6 (ref 5.0–8.0)

## 2014-11-22 LAB — TSH: TSH: 1.83 u[IU]/mL (ref 0.35–4.50)

## 2014-11-22 LAB — SEDIMENTATION RATE: Sed Rate: 56 mm/hr — ABNORMAL HIGH (ref 0–22)

## 2014-11-22 LAB — VITAMIN B12

## 2014-11-22 MED ORDER — NITROFURANTOIN MONOHYD MACRO 100 MG PO CAPS: 100.0000 mg | ORAL_CAPSULE | Freq: Two times a day (BID) | ORAL | Status: DC

## 2014-11-23 LAB — RPR

## 2014-11-26 ENCOUNTER — Telehealth: Payer: Self-pay | Admitting: Emergency Medicine

## 2014-11-26 NOTE — Telephone Encounter (Signed)
Patient's daughter called inquiring if her mother's appointment can be moved up any sooner than 11/18. She states that her mother has increased fatigue and has been sleeping a lot more lately. Left message on Mandy's cell phone that we can move her to 11/10 at 12:00 for labs and 12:15 for Sarah.

## 2014-12-09 ENCOUNTER — Ambulatory Visit (INDEPENDENT_AMBULATORY_CARE_PROVIDER_SITE_OTHER): Payer: Medicare Other | Admitting: Family Medicine

## 2014-12-09 ENCOUNTER — Encounter: Payer: Self-pay | Admitting: Family Medicine

## 2014-12-09 ENCOUNTER — Other Ambulatory Visit (INDEPENDENT_AMBULATORY_CARE_PROVIDER_SITE_OTHER): Payer: Medicare Other

## 2014-12-09 ENCOUNTER — Encounter: Payer: Self-pay | Admitting: Internal Medicine

## 2014-12-09 VITALS — BP 136/72 | HR 71 | Ht 60.0 in | Wt 115.0 lb

## 2014-12-09 DIAGNOSIS — M75101 Unspecified rotator cuff tear or rupture of right shoulder, not specified as traumatic: Secondary | ICD-10-CM

## 2014-12-09 DIAGNOSIS — M25512 Pain in left shoulder: Secondary | ICD-10-CM

## 2014-12-09 DIAGNOSIS — M12811 Other specific arthropathies, not elsewhere classified, right shoulder: Secondary | ICD-10-CM

## 2014-12-09 DIAGNOSIS — M25511 Pain in right shoulder: Secondary | ICD-10-CM | POA: Diagnosis not present

## 2014-12-09 DIAGNOSIS — Z23 Encounter for immunization: Secondary | ICD-10-CM | POA: Diagnosis not present

## 2014-12-09 DIAGNOSIS — M12812 Other specific arthropathies, not elsewhere classified, left shoulder: Secondary | ICD-10-CM

## 2014-12-09 DIAGNOSIS — M75102 Unspecified rotator cuff tear or rupture of left shoulder, not specified as traumatic: Secondary | ICD-10-CM

## 2014-12-09 NOTE — Progress Notes (Signed)
[facility] [address] [place], [place] [address] Phone: [phone] Subjective:    I'm seeing this patient by the request  of:  Cathlean Cower, MD   CC: right shoulder pain  [id number] Allison Page is a 81 y.o. female coming in with complaint of right shoulder pain.patient actually states this seems to be both shoulders. Patient does have some difficulty with memory and is accompanied with daughter. Patient states this is difficult even dressing herself. Patient hasn't of pain that is stopped her from certain activities. Can sometimes wake her up at night. Notices with range of motion a significant popping sensation that seems to be painful. Has had weakness mostly on the right side. Rates the severity of pain is 8 out of 10. Has tried some over-the-counter medications with no significant improvement.  Past Medical History  Diagnosis Date  . Iron deficiency anemia 03/21/2014  . Anemia of chronic renal failure, stage 3 (moderate) 03/21/2014  . High blood pressure   . Acid reflux   . TIA (transient ischemic attack)   . Diabetes ([place])   . High cholesterol   . Hypothyroidism   . Arthritis    Past Surgical History  Procedure Laterality Date  . Total hip arthroplasty Left 05/06/2008  . Cholecystectomy  2013  . Vaginal hysterectomy    . Ercp  2014  . Colonoscopy  05/02/2007  . Bladder suspension    . Anterior (cystocele) and posterior repair (rectocele) with xenform graft and sacrospinous fixation    . Cataract extraction    . Breast biopsy     Social History  Substance Use Topics  . Smoking status: Former Smoker -- 1.00 packs/day for 25 years    Types: Cigarettes    Quit date: 04/11/1970  . Smokeless tobacco: Never Used     Comment: quit 42 years ago  . Alcohol Use: No   Allergies  Allergen Reactions  . Allegra Allergy [Fexofenadine Hcl] Anaphylaxis  . Amoxicillin   . Penicillins   . Sulfa Antibiotics     Family History  Problem  Relation Age of Onset  . Heart attack Father   . Cancer Sister   . Esophageal cancer Daughter   . Arthritis Mother   . Seizures Daughter     As a teenager  . Colon cancer Son        Past medical history, social, surgical and family history all reviewed in electronic medical record.   Review of Systems: No headache, visual changes, nausea, vomiting, diarrhea, constipation, dizziness, abdominal pain, skin rash, fevers, chills, night sweats, weight loss, swollen lymph nodes, body aches, joint swelling, muscle aches, chest pain, shortness of breath, mood changes.   Objective Blood pressure 136/72, pulse 71, height 5' (1.524 m), weight 115 lb (52.164 kg), SpO2 97 %.  General: No apparent distress alert and oriented x3 mood and affect normal, dressed appropriately.  HEENT: Pupils equal, extraocular movements intact  Respiratory: Patient's speak in full sentences and does not appear short of breath  Cardiovascular: No lower extremity edema, non tender, no erythema  Skin: Warm dry intact with no signs of infection or rash on extremities or on axial skeleton.  Abdomen: Soft nontender  Neuro: Cranial nerves II through XII are intact, neurovascularly intact in all extremities with 2+ DTRs and 2+ pulses.  Lymph: No lymphadenopathy of posterior or anterior cervical chain or axillae bilaterally.  Gait normal with good balance and coordination.  MSK:  Non tender with full range  of motion and good stability and symmetric strength and tone of  elbows, wrist, hip, knee and ankles bilaterally.   Shoulder:bilateral Significant atrophy of the posterior musculature of the shoulders bilaterally Diffuse tenderness of the shoulders bilaterally record of left Range of motion is significantly decreased with active range of motion of 80 on the right shoulder at 105 on the left shoulder.severe crepitus with range of motion as well. 3 out of 5 strength of the rotator cuff bilaterally signs of impingement with  positive Neer and Hawkin's tests, but negative empty can sign. Speeds and Yergason's tests normal.   MSK US performed of: bilateral This study was ordered, performed, and interpreted by Charlann Boxer D.O.  Shoulder:   Severe osteoarthritic changes of the shoulders bilaterally right greater than left with bone-on-bone osteoarthritic changes. Significant atrophy with fatty depositions the rotator cuff  Impression: bilateral severe arthritis of the shoulders  Procedure: Real-time Ultrasound Guided Injection of right glenohumeral joint Device: GE Logiq E  Ultrasound guided injection is preferred based studies that show increased duration, increased effect, greater accuracy, decreased procedural pain, increased response rate with ultrasound guided versus blind injection.  Verbal informed consent obtained.  Time-out conducted.  Noted no overlying erythema, induration, or other signs of local infection.  Skin prepped in a sterile fashion.  Local anesthesia: Topical Ethyl chloride.  With sterile technique and under real time ultrasound guidance:  Joint visualized.  23g 1  inch needle inserted posterior approach. Pictures taken for needle placement. Patient did have injection of 2 cc of 1% lidocaine, 2 cc of 0.5% Marcaine, and 1.0 cc of Kenalog 40 mg/dL. Completed without difficulty  Pain immediately resolved suggesting accurate placement of the medication.  Advised to call if fevers/chills, erythema, induration, drainage, or persistent bleeding.  Images permanently stored and available for review in the ultrasound unit.  Impression: Technically successful ultrasound guided injection.  Procedure: Real-time Ultrasound Guided Injection of left glenohumeral joint Device: GE Logiq E  Ultrasound guided injection is preferred based studies that show increased duration, increased effect, greater accuracy, decreased procedural pain, increased response rate with ultrasound guided versus blind injection.    Verbal informed consent obtained.  Time-out conducted.  Noted no overlying erythema, induration, or other signs of local infection.  Skin prepped in a sterile fashion.  Local anesthesia: Topical Ethyl chloride.  With sterile technique and under real time ultrasound guidance:  Joint visualized.  23g 1  inch needle inserted posterior approach. Pictures taken for needle placement. Patient did have injection of 2 cc of 1% lidocaine, 2 cc of 0.5% Marcaine, and 1cc of Kenalog 40 mg/dL. Completed without difficulty  Pain immediately resolved suggesting accurate placement of the medication.  Advised to call if fevers/chills, erythema, induration, drainage, or persistent bleeding.  Images permanently stored and available for review in the ultrasound unit.  Impression: Technically successful ultrasound guided injection.    Impression and Recommendations:     This case required medical decision making of moderate complexity.

## 2014-12-09 NOTE — Patient Instructions (Addendum)
Good to see you.  Severe arthritis in the shoulder Ice 20 minutes 2 times daily. Usually after activity and before bed. Exercises 3 times a week for neck pennsaid pinkie amount topically 2 times daily as needed.  Vitamin D 2000 IU daily Tylenol 325mg  3 times a day scheduled  I can repeat the injections every 3 months.

## 2014-12-09 NOTE — Progress Notes (Signed)
Pre visit review using our clinic review tool, if applicable. No additional management support is needed unless otherwise documented below in the visit note. 

## 2014-12-09 NOTE — Assessment & Plan Note (Signed)
Patient given injections in both shoulders today. Patient does have a history of diabetes and warned about potentially increasing blood sugars over the course the next 3 days. We discussed icing regimen. Patient given a trial of topical anti-inflammatory. Discussed which activities to potentially avoid. We discussed with patient that she will not be pain-free with the severe amount of arthritis. Patient can have repeat injections every 3 months if necessary. The patient does not respond to this only curative measure would be shoulder replacement and at her age this would likely be a difficult procedure.

## 2014-12-10 ENCOUNTER — Encounter: Payer: Self-pay | Admitting: Family Medicine

## 2014-12-13 ENCOUNTER — Other Ambulatory Visit (HOSPITAL_BASED_OUTPATIENT_CLINIC_OR_DEPARTMENT_OTHER): Payer: Medicare Other

## 2014-12-13 ENCOUNTER — Ambulatory Visit (HOSPITAL_BASED_OUTPATIENT_CLINIC_OR_DEPARTMENT_OTHER): Payer: Medicare Other | Admitting: Family

## 2014-12-13 ENCOUNTER — Encounter: Payer: Self-pay | Admitting: Family

## 2014-12-13 ENCOUNTER — Ambulatory Visit
Admission: RE | Admit: 2014-12-13 | Discharge: 2014-12-13 | Disposition: A | Discharge status: Home / Self Care | Payer: Medicare Other | Source: Ambulatory Visit | Attending: Internal Medicine | Admitting: Internal Medicine

## 2014-12-13 ENCOUNTER — Ambulatory Visit: Payer: Medicare Other

## 2014-12-13 VITALS — BP 144/61 | HR 72 | Temp 97.8°F | Wt 115.0 lb

## 2014-12-13 DIAGNOSIS — N183 Chronic kidney disease, stage 3 unspecified: Secondary | ICD-10-CM

## 2014-12-13 DIAGNOSIS — D631 Anemia in chronic kidney disease: Secondary | ICD-10-CM | POA: Diagnosis not present

## 2014-12-13 DIAGNOSIS — D509 Iron deficiency anemia, unspecified: Secondary | ICD-10-CM | POA: Diagnosis present

## 2014-12-13 DIAGNOSIS — R413 Other amnesia: Secondary | ICD-10-CM

## 2014-12-13 LAB — CBC WITH DIFFERENTIAL (CANCER CENTER ONLY)
HCT: 33.3 % — ABNORMAL LOW (ref 34.8–46.6)
HGB: 10.7 g/dL — ABNORMAL LOW (ref 11.6–15.9)
MCH: 30.4 pg (ref 26.0–34.0)
MCHC: 32.1 g/dL (ref 32.0–36.0)
MCV: 95 fL (ref 81–101)
RBC: 3.52 10*6/uL — ABNORMAL LOW (ref 3.70–5.32)
RDW: 14.1 % (ref 11.1–15.7)
WBC: 3.3 10*3/uL — ABNORMAL LOW (ref 3.9–10.0)

## 2014-12-13 LAB — MANUAL DIFFERENTIAL (CHCC SATELLITE)
ALC: 1.6 10*3/uL (ref 0.6–2.2)
ANC (CHCC MAN DIFF): 1.3 10*3/uL — AB (ref 1.5–6.7)
BASO: 0 % (ref 0–2)
BLASTS: 0 % (ref 0–0)
Band Neutrophils: 0 % (ref 0–10)
Eos: 0 % (ref 0–7)
LYMPH: 49 % — AB (ref 14–48)
METAMYELOCYTES PCT: 0 % (ref 0–0)
MONO: 11 % (ref 0–13)
Myelocytes: 0 % (ref 0–0)
Other Cells: 0 % (ref 0–0)
Other Comments: 0
PLT EST ~~LOC~~: ADEQUATE
PROMYELO: 0 % (ref 0–0)
SEG: 40 % (ref 40–75)
Variant Lymph: 0 % (ref 0–0)
nRBC: 0 % (ref 0–0)

## 2014-12-13 LAB — FERRITIN CHCC: FERRITIN: 186 ng/mL (ref 9–269)

## 2014-12-13 LAB — IRON AND TIBC CHCC
%SAT: 37 % (ref 21–57)
IRON: 94 ug/dL (ref 41–142)
TIBC: 251 ug/dL (ref 236–444)
UIBC: 158 ug/dL (ref 120–384)

## 2014-12-13 LAB — RETICULOCYTES (CHCC)
ABS RETIC: 60.5 10*3/uL (ref 19.0–186.0)
RBC.: 3.56 MIL/uL — ABNORMAL LOW (ref 3.87–5.11)
Retic Ct Pct: 1.7 % (ref 0.4–2.3)

## 2014-12-13 NOTE — Progress Notes (Signed)
Hematology and Oncology Follow Up Visit  JESSELLE SANDI [id number] 1922/08/12 81 y.o. 12/13/2014   Principle Diagnosis:  Anemia of renal insufficiency Iron deficiency anemia  Current Therapy:   IV iron as indicated Aranesp 300 g subcutaneous for hemoglobin less than 10    Interim History:  Ms. Combee is here today with her daughter for a follow-up. She is feeling fatigued and napping a little more during the day than usual. Her hgb is 10.7 with an MCV of 95.  In September, her iron saturation was 37% with a ferritin of 120.  She last received Aranesp in July.  She has been having trouble with arthritis in her right shoulder, neck and back.  She has had no fever, chills, n/v, rash, dizziness, SOB, chest pain, palpitations, abdominal pain or changes in bowel or bladder habits. She has a dry cough at this time and is taking Mucinex. Lung sounds are clear.  She is eating fairly well and staying hydrated. Her weight is unchanged.     Medications:    Medication List       This list is accurate as of: 12/13/14 12:31 PM.  Always use your most recent med list.               aluminum-magnesium hydroxide-simethicone [id number] MG/5ML Susp  Commonly known as:  MAALOX  2 teaspoons by mouth 3 times daily as needed for heartburn     amLODipine 5 MG tablet  Commonly known as:  NORVASC  Take 5 mg by mouth daily.     aspirin 81 MG tablet  Take 81 mg by mouth daily.     atorvastatin 20 MG tablet  Commonly known as:  LIPITOR  Take 20 mg by mouth daily at 6 PM.     cholecalciferol 1000 UNITS tablet  Commonly known as:  VITAMIN D  Take 2,000 Units by mouth daily.     clindamycin 150 MG capsule  Commonly known as:  CLEOCIN  4 by mouth 1 hour prior to dental procedures     guaiFENesin 600 MG 12 hr tablet  Commonly known as:  MUCINEX  Take 600 mg by mouth 2 (two) times daily. As needed for cough and congestion     hydrALAZINE 25 MG tablet  Commonly known as:  APRESOLINE    Take 1 tablet (25 mg total) by mouth 3 (three) times daily.     levothyroxine 100 MCG tablet  Commonly known as:  SYNTHROID, LEVOTHROID  Take 100 mcg by mouth daily before breakfast.     magnesium oxide 400 MG tablet  Commonly known as:  MAG-OX  Take 400 mg by mouth daily.     Melatonin 3 MG Tabs  Take by mouth. 2 by mouth at bedtime     nitrofurantoin (macrocrystal-monohydrate) 100 MG capsule  Commonly known as:  MACROBID  Take 1 capsule (100 mg total) by mouth 2 (two) times daily.     pantoprazole 40 MG tablet  Commonly known as:  PROTONIX  Take 1 tablet (40 mg total) by mouth daily.     polyethylene glycol packet  Commonly known as:  MIRALAX / GLYCOLAX  Take 17 g by mouth daily.     senna-docusate 8.6-50 MG tablet  Commonly known as:  Senokot-S  Take 1-2 tablets, IF NEEDED, every 12 hrs for constipation     traMADol 50 MG tablet  Commonly known as:  ULTRAM  Take one tablet by mouth twice daily for pain     TYLENOL  325 MG Caps  Generic drug:  Acetaminophen  Take 325 mg by mouth 3 (three) times daily after meals.     vitamin B-12 1000 MCG tablet  Commonly known as:  CYANOCOBALAMIN  Take 1,000 mcg by mouth daily.        Allergies:  Allergies  Allergen Reactions  . Allegra Allergy [Fexofenadine Hcl] Anaphylaxis  . Amoxicillin   . Penicillins   . Sulfa Antibiotics     Past Medical History, Surgical history, Social history, and Family History were reviewed and updated.  Review of Systems: All other 10 point review of systems is negative.   Physical Exam:  weight is 115 lb (52.164 kg). Her oral temperature is 97.8 F (36.6 C). Her blood pressure is 144/61 and her pulse is 72.   Wt Readings from Last 3 Encounters:  12/13/14 115 lb (52.164 kg)  12/09/14 115 lb (52.164 kg)  11/19/14 118 lb (53.524 kg)    Ocular: Sclerae unicteric, pupils equal, round and reactive to light Ear-nose-throat: Oropharynx clear, dentition fair Lymphatic: No cervical,  supraclavicular or axillary adenopathy Lungs no rales or rhonchi, good excursion bilaterally Heart regular rate and rhythm, no murmur appreciated Abd soft, nontender, positive bowel sounds MSK no focal spinal tenderness, no joint edema Neuro: non-focal, well-oriented, appropriate affect Breasts: Deferred  Lab Results  Component Value Date   WBC 3.3* 12/13/2014   HGB 10.7* 12/13/2014   HCT 33.3* 12/13/2014   MCV 95 12/13/2014   PLT 359 Large platelets present 12/13/2014   Lab Results  Component Value Date   FERRITIN 120 10/04/2014   IRON 85 10/04/2014   TIBC 230* 10/04/2014   UIBC 145 10/04/2014   IRONPCTSAT 37 10/04/2014   Lab Results  Component Value Date   RETICCTPCT 0.7 10/04/2014   RBC 3.52* 12/13/2014   RETICCTABS 24.7 10/04/2014   No results found for: KPAFRELGTCHN, LAMBDASER, KAPLAMBRATIO No results found for: Kandis Cocking, IGMSERUM No results found for: Odetta Pink, SPEI   Chemistry      Component Value Date/Time   NA 136 11/22/2014 1159   NA 137 10/04/2014 1333   NA 135 07/05/2014 1622   K 4.0 11/22/2014 1159   K 4.5 10/04/2014 1333   CL 101 11/22/2014 1159   CL 106 10/04/2014 1333   CO2 28 11/22/2014 1159   CO2 25 10/04/2014 1333   BUN 21 11/22/2014 1159   BUN 25* 10/04/2014 1333   BUN 24 07/05/2014 1622   CREATININE 1.68* 11/22/2014 1159   CREATININE 1.8* 10/04/2014 1333      Component Value Date/Time   CALCIUM 11.3* 11/22/2014 1159   CALCIUM 10.8* 10/04/2014 1333   ALKPHOS 70 11/22/2014 1159   ALKPHOS 59 10/04/2014 1333   AST 15 11/22/2014 1159   AST 19 10/04/2014 1333   ALT 8 11/22/2014 1159   ALT 10 10/04/2014 1333   BILITOT 0.4 11/22/2014 1159   BILITOT 0.40 10/04/2014 1333   BILITOT 0.2 07/05/2014 1622     Impression and Plan: Ms. Lilienthal is a 81 yo white female with multifactorial anemia. She is symptomatic today with fatigue. She is having a hard time with arthritis in her  right shoulder, neck and back.  Her Hgb today is 10.7 with an MCV of 95. We will see what her iron studies show. We can bring her back in next week for Fera[place]me next week if needed.  She will not need Aranesp today.  We will plan to see her back  in 2 months for labs and follow-up.  Both she and her daughter know to contact us with any questions or concerns. We can certainly see her sooner if need be.   Eliezer Bottom, NP 11/11/201612:31 PM

## 2014-12-17 ENCOUNTER — Encounter: Payer: Self-pay | Admitting: Internal Medicine

## 2014-12-17 ENCOUNTER — Encounter: Payer: Self-pay | Admitting: Hematology & Oncology

## 2014-12-18 ENCOUNTER — Encounter: Payer: Self-pay | Admitting: Family Medicine

## 2014-12-20 ENCOUNTER — Encounter: Payer: Self-pay | Admitting: Hematology & Oncology

## 2014-12-20 ENCOUNTER — Other Ambulatory Visit: Payer: Medicare Other

## 2014-12-20 ENCOUNTER — Encounter: Payer: Self-pay | Admitting: Internal Medicine

## 2014-12-20 ENCOUNTER — Ambulatory Visit: Payer: Medicare Other | Admitting: Family

## 2014-12-20 ENCOUNTER — Ambulatory Visit: Payer: Medicare Other

## 2014-12-25 ENCOUNTER — Encounter: Payer: Self-pay | Admitting: Internal Medicine

## 2014-12-25 NOTE — Telephone Encounter (Signed)
Done hardcopy to Dahlia  

## 2015-01-01 ENCOUNTER — Encounter: Payer: Self-pay | Admitting: Neurology

## 2015-01-01 ENCOUNTER — Ambulatory Visit (INDEPENDENT_AMBULATORY_CARE_PROVIDER_SITE_OTHER): Payer: Medicare Other | Admitting: Neurology

## 2015-01-01 VITALS — BP 138/60 | HR 83 | Resp 14 | Wt 112.0 lb

## 2015-01-01 DIAGNOSIS — E119 Type 2 diabetes mellitus without complications: Secondary | ICD-10-CM

## 2015-01-01 DIAGNOSIS — F0390 Unspecified dementia without behavioral disturbance: Secondary | ICD-10-CM | POA: Diagnosis not present

## 2015-01-01 DIAGNOSIS — E038 Other specified hypothyroidism: Secondary | ICD-10-CM | POA: Diagnosis not present

## 2015-01-01 DIAGNOSIS — F03B0 Unspecified dementia, moderate, without behavioral disturbance, psychotic disturbance, mood disturbance, and anxiety: Secondary | ICD-10-CM

## 2015-01-01 DIAGNOSIS — I10 Essential (primary) hypertension: Secondary | ICD-10-CM

## 2015-01-01 DIAGNOSIS — E785 Hyperlipidemia, unspecified: Secondary | ICD-10-CM

## 2015-01-01 MED ORDER — DONEPEZIL HCL 10 MG PO TABS: ORAL_TABLET | ORAL | Status: DC
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
## 2015-01-01 NOTE — Patient Instructions (Addendum)
1. Start Aricept 10mg : Take 1/2 tablet daily for 2 weeks, then increase to 1 tablet daily 2. Control of BP, cholesterol, diabetes, as well as physical exercise and brain stimulation exercises are important for brain health 3. Patient reporting daytime drowsiness, give melatonin earlier at night (after dinner instead of bedtime) and see if this helps with daytime drowsiness 4. Follow-up in 6 months

## 2015-01-16 ENCOUNTER — Telehealth: Payer: Self-pay | Admitting: Internal Medicine

## 2015-01-16 NOTE — Telephone Encounter (Signed)
Roxy Manns from [facility] is wanting to continue physical therapy for 2 x wk for 4 wks. He also states pt is having a hard time swallowing her medications and there have been some choking episodes. He is wanting an evaluation to see what kind of therapy that would benefit pt. Roxy Manns can be reached at [phone]

## 2015-01-16 NOTE — Telephone Encounter (Signed)
Verbal authorization given per MD protocol 

## 2015-02-04 ENCOUNTER — Encounter: Payer: Self-pay | Admitting: Internal Medicine

## 2015-02-04 NOTE — Telephone Encounter (Signed)
tammy or other to call daughter regarding mother OV appt

## 2015-02-06 ENCOUNTER — Ambulatory Visit (INDEPENDENT_AMBULATORY_CARE_PROVIDER_SITE_OTHER): Payer: Medicare Other | Admitting: Internal Medicine

## 2015-02-06 ENCOUNTER — Encounter: Payer: Self-pay | Admitting: Internal Medicine

## 2015-02-06 VITALS — BP 148/60 | HR 72 | Temp 98.3°F | Ht 61.0 in | Wt 117.0 lb

## 2015-02-06 DIAGNOSIS — E119 Type 2 diabetes mellitus without complications: Secondary | ICD-10-CM

## 2015-02-06 DIAGNOSIS — I10 Essential (primary) hypertension: Secondary | ICD-10-CM

## 2015-02-06 DIAGNOSIS — M5416 Radiculopathy, lumbar region: Secondary | ICD-10-CM | POA: Diagnosis not present

## 2015-02-06 MED ORDER — GABAPENTIN 100 MG PO CAPS: 100.0000 mg | ORAL_CAPSULE | Freq: Three times a day (TID) | ORAL | Status: DC

## 2015-02-06 NOTE — Patient Instructions (Signed)
Please take all new medication as prescribed - the gabapentin for pain  Please start the gabapentin according to the written instructions  Please continue all other medications as before, and refills have been done if requested.  Please have the pharmacy call with any other refills you may need.  Please keep your appointments with your specialists as you may have planned  You will be contacted regarding the referral for: MRI for the lower back, and the Neurosurgury

## 2015-02-06 NOTE — Progress Notes (Signed)
Pre visit review using our clinic review tool, if applicable. No additional management support is needed unless otherwise documented below in the visit note. 

## 2015-02-06 NOTE — Progress Notes (Signed)
Subjective:    Patient ID: Allison Page, female    DOB: [birth year], 82 y.o.   MRN: [id number]  HPI  Here with daughter, c/o .2 wks onset mod constant left LBPm but no bowel or bladder change, fever, wt loss, worsening LE weakness, gait change or falls, but with marked pain and numbness extending to distal LLE, walks with walker and no falls but .mod to high fall risk is likely.  Pt denies new neurological symptoms such as new headache, or facial or extremity weakness or numbness except for the above.   Pt denies polydipsia, polyuria, Pt denies chest pain, increased sob or doe, wheezing, orthopnea, PND, increased LE swelling, palpitations, dizziness or syncope. Past Medical History  Diagnosis Date  . Iron deficiency anemia 03/21/2014  . Anemia of chronic renal failure, stage 3 (moderate) 03/21/2014  . High blood pressure   . Acid reflux   . TIA (transient ischemic attack)   . Diabetes ([place])   . High cholesterol   . Hypothyroidism   . Arthritis   . Dementia 11/19/2014   Past Surgical History  Procedure Laterality Date  . Total hip arthroplasty Left 05/06/2008  . Cholecystectomy  2013  . Vaginal hysterectomy    . Ercp  2014  . Colonoscopy  05/02/2007  . Bladder suspension    . Anterior (cystocele) and posterior repair (rectocele) with xenform graft and sacrospinous fixation    . Cataract extraction    . Breast biopsy      reports that she quit smoking about 44 years ago. Her smoking use included Cigarettes. She has a 25 pack-year smoking history. She has never used smokeless tobacco. She reports that she does not drink alcohol or use illicit drugs. family history includes Arthritis in her mother; Cancer in her sister; Colon cancer in her son; Esophageal cancer in her daughter; Heart attack in her father; Seizures in her daughter. Allergies  Allergen Reactions  . Allegra Allergy [Fexofenadine Hcl] Anaphylaxis  . Amoxicillin   . Penicillins   . Sulfa Antibiotics    Current  Outpatient Prescriptions on File Prior to Visit  Medication Sig Dispense Refill  . Acetaminophen (TYLENOL) 325 MG CAPS Take 325 mg by mouth 3 (three) times daily after meals.    Marland Kitchen aluminum-magnesium hydroxide-simethicone (MAALOX) [id number] MG/5ML SUSP 2 teaspoons by mouth 3 times daily as needed for heartburn    . amLODipine (NORVASC) 5 MG tablet Take 5 mg by mouth daily.    Marland Kitchen aspirin 81 MG tablet Take 81 mg by mouth daily.    Marland Kitchen atorvastatin (LIPITOR) 20 MG tablet Take 20 mg by mouth daily at 6 PM.    . cholecalciferol (VITAMIN D) 1000 UNITS tablet Take 2,000 Units by mouth daily.     . clindamycin (CLEOCIN) 150 MG capsule 4 by mouth 1 hour prior to dental procedures (Patient not taking: Reported on 01/01/2015) 4 capsule 0  . donepezil (ARICEPT) 10 MG tablet Take 1/2 tablet daily for 2 weeks, then increase to 1 tablet daily 30 tablet 11  . hydrALAZINE (APRESOLINE) 25 MG tablet Take 1 tablet (25 mg total) by mouth 3 (three) times daily. 90 tablet 11  . levothyroxine (SYNTHROID, LEVOTHROID) 100 MCG tablet Take 100 mcg by mouth daily before breakfast.    . magnesium oxide (MAG-OX) 400 MG tablet Take 400 mg by mouth daily.    . Melatonin 3 MG TABS Take by mouth. 2 by mouth at bedtime    . pantoprazole (PROTONIX) 40 MG tablet Take  1 tablet (40 mg total) by mouth daily. 90 tablet 3  . polyethylene glycol (MIRALAX / GLYCOLAX) packet Take 17 g by mouth daily.    Marland Kitchen senna-docusate (SENOKOT-S) 8.6-50 MG per tablet Take 1-2 tablets, IF NEEDED, every 12 hrs for constipation 60 tablet 4  . traMADol (ULTRAM) 50 MG tablet Take one tablet by mouth twice daily for pain 60 tablet 5  . vitamin B-12 (CYANOCOBALAMIN) 1000 MCG tablet Take 1,000 mcg by mouth daily.     No current facility-administered medications on file prior to visit.    Review of Systems  Constitutional: Negative for unusual diaphoresis or night sweats HENT: Negative for ringing in ear or discharge Eyes: Negative for double vision or worsening  visual disturbance.  Respiratory: Negative for choking and stridor.   Gastrointestinal: Negative for vomiting or other signifcant bowel change Genitourinary: Negative for hematuria or change in urine volume.  Musculoskeletal: Negative for other MSK pain or swelling Skin: Negative for color change and worsening wound.  Neurological: Negative for tremors and numbness other than noted  Psychiatric/Behavioral: Negative for decreased concentration or agitation other than above       Objective:   Physical Exam BP 148/60 mmHg  Pulse 72  Temp(Src) 98.3 F (36.8 C) (Oral)  Ht 5\' 1"  (1.549 m)  Wt 117 lb (53.071 kg)  BMI 22.12 kg/m2  SpO2 97% VS noted, mild ill  Constitutional: Pt appears in no significant distress HENT: Head: NCAT.  Right Ear: External ear normal.  Left Ear: External ear normal.  Eyes: . Pupils are equal, round, and reactive to light. Conjunctivae and EOM are normal Neck: Normal range of motion. Neck supple.  Cardiovascular: Normal rate and regular rhythm.   Pulmonary/Chest: Effort normal and breath sounds without rales or wheezing.  Abd:  Soft, NT, ND, + BS Spine nontender, no lumbar paravertebral tender or swelling or rash Neurological: Pt is alert. Not confused , motor 5/5 intact for age, decreased sens to LT to distal LE's Skin: Skin is warm. No rash, no LE edema Psychiatric: Pt behavior is normal. No agitation.     Assessment & Plan:

## 2015-02-07 ENCOUNTER — Telehealth: Payer: Self-pay | Admitting: Family Medicine

## 2015-02-07 ENCOUNTER — Encounter: Payer: Self-pay | Admitting: Neurology

## 2015-02-07 ENCOUNTER — Encounter: Payer: Self-pay | Admitting: Internal Medicine

## 2015-02-07 NOTE — Telephone Encounter (Signed)
Called patient's daughter/Amanda Mee Hives in reference to a my chart message that she sent today. She stated in the email that [facility] has been sending a request for the patient's ov notes & they told her that they haven't received a response after several tries. I explained to her that I hadn't gotten a request from [facility] and that I'm usually the one that handles those request. I did advise her to have them refax the request to our main fax number as well as our clinical fax number to assure that I get the form. I did give her both numbers. I also advised her to have them make sure they attach a release with their request because I won't be able to send them anything without one. She verbalized good understanding.

## 2015-02-08 NOTE — Assessment & Plan Note (Signed)
stable overall by history and exam, recent data reviewed with pt, and pt to continue medical treatment as before,  to f/u any worsening symptoms or concerns Lab Results  Component Value Date   HGBA1C 6.2* 03/29/2014    

## 2015-02-08 NOTE — Assessment & Plan Note (Signed)
stable overall by history and exam, recent data reviewed with pt, and pt to continue medical treatment as before,  to f/u any worsening symptoms or concerns BP Readings from Last 3 Encounters:  02/06/15 148/60  01/01/15 138/60  12/13/14 144/61

## 2015-02-08 NOTE — Assessment & Plan Note (Addendum)
With 2 wks mod to severe pain left side, no neuro changes but high risk fall, for MRI and NS referral, also gabapentin trial for pain,  to f/u any worsening symptoms or concerns  Note:  Total time for pt hx, exam, review of record with pt in the room, determination of diagnoses and plan for further eval and tx is > 40 min, with over 50% spent in coordination and counseling of patient

## 2015-02-10 ENCOUNTER — Encounter: Payer: Self-pay | Admitting: Internal Medicine

## 2015-02-12 ENCOUNTER — Telehealth: Payer: Self-pay | Admitting: Internal Medicine

## 2015-02-12 ENCOUNTER — Other Ambulatory Visit: Payer: Medicare Other

## 2015-02-12 ENCOUNTER — Ambulatory Visit: Payer: Medicare Other | Admitting: Hematology & Oncology

## 2015-02-12 NOTE — Telephone Encounter (Signed)
States she faxed over orders for refills on patients medications yesterday.  States patient will be out of medication this weekend.  Needs signed order back as soon as possible.  Please follow up in regards.

## 2015-02-14 NOTE — Telephone Encounter (Signed)
Verified with Jenny Reichmann, orders received 02/13/2015

## 2015-02-21 ENCOUNTER — Ambulatory Visit
Admission: RE | Admit: 2015-02-21 | Discharge: 2015-02-21 | Disposition: A | Discharge status: Home / Self Care | Payer: Medicare Other | Source: Ambulatory Visit | Attending: Internal Medicine | Admitting: Internal Medicine

## 2015-02-21 DIAGNOSIS — M5416 Radiculopathy, lumbar region: Secondary | ICD-10-CM

## 2015-02-26 ENCOUNTER — Encounter: Payer: Self-pay | Admitting: Internal Medicine

## 2015-02-26 DIAGNOSIS — D500 Iron deficiency anemia secondary to blood loss (chronic): Secondary | ICD-10-CM

## 2015-02-26 DIAGNOSIS — D61818 Other pancytopenia: Secondary | ICD-10-CM

## 2015-02-26 MED ORDER — TRAMADOL HCL 50 MG PO TABS: ORAL_TABLET | ORAL | Status: DC

## 2015-02-26 NOTE — Telephone Encounter (Signed)
[facility] for tramadol - Done hardcopy to [facility]

## 2015-02-26 NOTE — Telephone Encounter (Signed)
Medication printed signed and faxed

## 2015-02-28 ENCOUNTER — Encounter: Payer: Self-pay | Admitting: Hematology & Oncology

## 2015-02-28 ENCOUNTER — Other Ambulatory Visit (HOSPITAL_BASED_OUTPATIENT_CLINIC_OR_DEPARTMENT_OTHER): Payer: Medicare Other

## 2015-02-28 ENCOUNTER — Ambulatory Visit: Payer: Medicare Other

## 2015-02-28 ENCOUNTER — Ambulatory Visit (HOSPITAL_BASED_OUTPATIENT_CLINIC_OR_DEPARTMENT_OTHER): Payer: Medicare Other | Admitting: Hematology & Oncology

## 2015-02-28 VITALS — BP 135/55 | HR 70 | Temp 98.0°F | Resp 18 | Ht 61.0 in | Wt 116.0 lb

## 2015-02-28 DIAGNOSIS — N189 Chronic kidney disease, unspecified: Secondary | ICD-10-CM

## 2015-02-28 DIAGNOSIS — D631 Anemia in chronic kidney disease: Secondary | ICD-10-CM | POA: Diagnosis not present

## 2015-02-28 DIAGNOSIS — N183 Chronic kidney disease, stage 3 unspecified: Secondary | ICD-10-CM

## 2015-02-28 DIAGNOSIS — D509 Iron deficiency anemia, unspecified: Secondary | ICD-10-CM | POA: Diagnosis not present

## 2015-02-28 DIAGNOSIS — D649 Anemia, unspecified: Secondary | ICD-10-CM | POA: Diagnosis not present

## 2015-02-28 DIAGNOSIS — N289 Disorder of kidney and ureter, unspecified: Secondary | ICD-10-CM | POA: Diagnosis not present

## 2015-02-28 LAB — CBC WITH DIFFERENTIAL (CANCER CENTER ONLY)
HCT: 30.6 % — ABNORMAL LOW (ref 34.8–46.6)
HGB: 9.8 g/dL — ABNORMAL LOW (ref 11.6–15.9)
MCH: 31.1 pg (ref 26.0–34.0)
MCHC: 32 g/dL (ref 32.0–36.0)
MCV: 97 fL (ref 81–101)
PLATELETS: 264 10*3/uL (ref 145–400)
RBC: 3.15 10*6/uL — AB (ref 3.70–5.32)
RDW: 13.2 % (ref 11.1–15.7)
WBC: 3.1 10*3/uL — AB (ref 3.9–10.0)

## 2015-02-28 LAB — MANUAL DIFFERENTIAL (CHCC SATELLITE)
ALC: 1.1 10*3/uL (ref 0.6–2.2)
ANC (CHCC HP manual diff): 2 10*3/uL (ref 1.5–6.7)
Eos: 1 % (ref 0–7)
LYMPH: 34 % (ref 14–48)
MONO: 2 % (ref 0–13)
PLT EST ~~LOC~~: ADEQUATE
SEG: 63 % (ref 40–75)

## 2015-02-28 LAB — RETICULOCYTES: RETICULOCYTE COUNT: 1.1 % (ref 0.6–2.6)

## 2015-02-28 MED ORDER — DARBEPOETIN ALFA 300 MCG/0.6ML IJ SOSY
PREFILLED_SYRINGE | INTRAMUSCULAR | Status: AC
  Filled 2015-02-28: qty 0.6

## 2015-02-28 MED ORDER — DARBEPOETIN ALFA 300 MCG/0.6ML IJ SOSY
300.0000 ug | PREFILLED_SYRINGE | Freq: Once | INTRAMUSCULAR | Status: AC
  Administered 2015-02-28: 300 ug via SUBCUTANEOUS

## 2015-02-28 NOTE — Progress Notes (Signed)
Hematology and Oncology Follow Up Visit  Allison Page [id number] January 22, 1923 82 y.o. 02/28/2015   Principle Diagnosis:   Anemia of renal insufficiency  Iron deficiency anemia  Current Therapy:    IV iron as indicated  Aranesp 300 g subcutaneous for hemoglobin less than 10.     Interim History:  Allison Page is back for follow-up.  She is doing quite well. She has had a nice  Christmas holiday   she had some MRIs done. She has very bad arthritis in her back. Her right arm is getting weaker. I suspect that she probably has stenosis in the cervical spine. She actually is going to [place] for a special MRI.   She's had no obvious bleeding.  She is eating okay. She's having no problems with nausea or vomiting. She's had no issues with pain.  There's been no change in bowel or bladder habits. She does need a laxative.   We'll last saw her,  Her ferritin was  186 with an iron saturation of 37%.   Last time we gave her Aranesp was back in  July.  Overall, her performance status is ECOG 2.   Medications:  Current outpatient prescriptions:  .  Acetaminophen (TYLENOL) 325 MG CAPS, Take 325 mg by mouth 3 (three) times daily after meals., Disp: , Rfl:  .  aluminum-magnesium hydroxide-simethicone (MAALOX) [id number] MG/5ML SUSP, 2 teaspoons by mouth 3 times daily as needed for heartburn, Disp: , Rfl:  .  amLODipine (NORVASC) 5 MG tablet, Take 5 mg by mouth daily., Disp: , Rfl:  .  aspirin 81 MG tablet, Take 81 mg by mouth daily., Disp: , Rfl:  .  atorvastatin (LIPITOR) 20 MG tablet, Take 20 mg by mouth daily at 6 PM., Disp: , Rfl:  .  cholecalciferol (VITAMIN D) 1000 UNITS tablet, Take 2,000 Units by mouth daily. , Disp: , Rfl:  .  clindamycin (CLEOCIN) 150 MG capsule, 4 by mouth 1 hour prior to dental procedures, Disp: 4 capsule, Rfl: 0 .  donepezil (ARICEPT) 10 MG tablet, Take 1/2 tablet daily for 2 weeks, then increase to 1 tablet daily, Disp: 30 tablet, Rfl: 11 .   gabapentin (NEURONTIN) 100 MG capsule, Take 1 capsule (100 mg total) by mouth 3 (three) times daily., Disp: 90 capsule, Rfl: 5 .  hydrALAZINE (APRESOLINE) 25 MG tablet, Take 1 tablet (25 mg total) by mouth 3 (three) times daily., Disp: 90 tablet, Rfl: 11 .  levothyroxine (SYNTHROID, LEVOTHROID) 100 MCG tablet, Take 100 mcg by mouth daily before breakfast., Disp: , Rfl:  .  magnesium oxide (MAG-OX) 400 MG tablet, Take 400 mg by mouth daily., Disp: , Rfl:  .  Melatonin 3 MG TABS, Take by mouth. 2 by mouth at bedtime, Disp: , Rfl:  .  pantoprazole (PROTONIX) 40 MG tablet, Take 1 tablet (40 mg total) by mouth daily., Disp: 90 tablet, Rfl: 3 .  polyethylene glycol (MIRALAX / GLYCOLAX) packet, Take 17 g by mouth daily., Disp: , Rfl:  .  senna-docusate (SENOKOT-S) 8.6-50 MG per tablet, Take 1-2 tablets, IF NEEDED, every 12 hrs for constipation, Disp: 60 tablet, Rfl: 4 .  traMADol (ULTRAM) 50 MG tablet, Take one tablet by mouth twice daily for pain, Disp: 120 tablet, Rfl: 2 .  vitamin B-12 (CYANOCOBALAMIN) 1000 MCG tablet, Take 1,000 mcg by mouth daily., Disp: , Rfl:   Allergies:  Allergies  Allergen Reactions  . Allegra Allergy [Fexofenadine Hcl] Anaphylaxis  . Amoxicillin   . Penicillins   . Sulfa  Antibiotics     Past Medical History, Surgical history, Social history, and Family History were reviewed and updated.  Review of Systems: As above  Physical Exam:  height is 5\' 1"  (1.549 m) and weight is 116 lb (52.617 kg). Her oral temperature is 98 F (36.7 C). Her blood pressure is 135/55 and her pulse is 70. Her respiration is 18.   Wt Readings from Last 3 Encounters:  02/28/15 116 lb (52.617 kg)  02/06/15 117 lb (53.071 kg)  01/01/15 112 lb (50.803 kg)     Elderly white female in no obvious distress. Head and neck exam shows no ocular or oral lesions. There are no palpable cervical or supraclavicular lymph nodes. Lungs are clear. Cardiac exam regular rate and rhythm with no murmurs, rubs  or bruits. Abdomen is soft. She has good bowel sounds. There may be a little bit of distention. There is no fluid wave. There is no palpable liver or spleen tip. Extremities shows some trace edema in her legs. She has some age-related osteoarthritic changes in her joints. She has 4+/5 strength in her extremities. Skin exam shows no rashes, ecchymoses or petechia. Neurological exam shows no focal neurological deficits.  Lab Results  Component Value Date   WBC 3.1* 02/28/2015   HGB 9.8* 02/28/2015   HCT 30.6* 02/28/2015   MCV 97 02/28/2015   PLT 264 02/28/2015     Chemistry      Component Value Date/Time   NA 136 11/22/2014 1159   NA 137 10/04/2014 1333   NA 135 07/05/2014 1622   K 4.0 11/22/2014 1159   K 4.5 10/04/2014 1333   CL 101 11/22/2014 1159   CL 106 10/04/2014 1333   CO2 28 11/22/2014 1159   CO2 25 10/04/2014 1333   BUN 21 11/22/2014 1159   BUN 25* 10/04/2014 1333   BUN 24 07/05/2014 1622   CREATININE 1.68* 11/22/2014 1159   CREATININE 1.8* 10/04/2014 1333      Component Value Date/Time   CALCIUM 11.3* 11/22/2014 1159   CALCIUM 10.8* 10/04/2014 1333   ALKPHOS 70 11/22/2014 1159   ALKPHOS 59 10/04/2014 1333   AST 15 11/22/2014 1159   AST 19 10/04/2014 1333   ALT 8 11/22/2014 1159   ALT 10 10/04/2014 1333   BILITOT 0.4 11/22/2014 1159   BILITOT 0.40 10/04/2014 1333   BILITOT 0.2 07/05/2014 1622         Impression and Plan: Allison Page is 82 year old white female.  She has anemia of renal insufficiency and iron deficiency anemia. Her erythropoietin level was only  21 when we checked her.   I will going give her a dose of Aranesp today. This always works for her.   we will see what her iron levels are. If they are borderline or low, then we will see about given her iron.   Volanda Napoleon, MD 1/27/20172:47 PM

## 2015-02-28 NOTE — Patient Instructions (Signed)
Darbepoetin Alfa injection What is this medicine? DARBEPOETIN ALFA (dar be POE e tin AL fa) helps your body make more red blood cells. It is used to treat anemia caused by chronic kidney failure and chemotherapy. This medicine may be used for other purposes; ask your health care provider or pharmacist if you have questions. What should I tell my health care provider before I take this medicine? They need to know if you have any of these conditions: -blood clotting disorders or history of blood clots -cancer patient not on chemotherapy -cystic fibrosis -heart disease, such as angina, heart failure, or a history of a heart attack -hemoglobin level of 12 g/dL or greater -high blood pressure -low levels of folate, iron, or vitamin B12 -seizures -an unusual or allergic reaction to darbepoetin, erythropoietin, albumin, hamster proteins, latex, other medicines, foods, dyes, or preservatives -pregnant or trying to get pregnant -breast-feeding How should I use this medicine? This medicine is for injection into a vein or under the skin. It is usually given by a health care professional in a hospital or clinic setting. If you get this medicine at home, you will be taught how to prepare and give this medicine. Do not shake the solution before you withdraw a dose. Use exactly as directed. Take your medicine at regular intervals. Do not take your medicine more often than directed. It is important that you put your used needles and syringes in a special sharps container. Do not put them in a trash can. If you do not have a sharps container, call your pharmacist or healthcare provider to get one. Talk to your pediatrician regarding the use of this medicine in children. While this medicine may be used in children as young as 1 year for selected conditions, precautions do apply. Overdosage: If you think you have taken too much of this medicine contact a poison control center or emergency room at once. NOTE:  This medicine is only for you. Do not share this medicine with others. What if I miss a dose? If you miss a dose, take it as soon as you can. If it is almost time for your next dose, take only that dose. Do not take double or extra doses. What may interact with this medicine? Do not take this medicine with any of the following medications: -epoetin alfa This list may not describe all possible interactions. Give your health care provider a list of all the medicines, herbs, non-prescription drugs, or dietary supplements you use. Also tell them if you smoke, drink alcohol, or use illegal drugs. Some items may interact with your medicine. What should I watch for while using this medicine? Visit your prescriber or health care professional for regular checks on your progress and for the needed blood tests and blood pressure measurements. It is especially important for the doctor to make sure your hemoglobin level is in the desired range, to limit the risk of potential side effects and to give you the best benefit. Keep all appointments for any recommended tests. Check your blood pressure as directed. Ask your doctor what your blood pressure should be and when you should contact him or her. As your body makes more red blood cells, you may need to take iron, folic acid, or vitamin B supplements. Ask your doctor or health care provider which products are right for you. If you have kidney disease continue dietary restrictions, even though this medication can make you feel better. Talk with your doctor or health care professional about the   foods you eat and the vitamins that you take. What side effects may I notice from receiving this medicine? Side effects that you should report to your doctor or health care professional as soon as possible: -allergic reactions like skin rash, itching or hives, swelling of the face, lips, or tongue -breathing problems -changes in vision -chest pain -confusion, trouble speaking  or understanding -feeling faint or lightheaded, falls -high blood pressure -muscle aches or pains -pain, swelling, warmth in the leg -rapid weight gain -severe headaches -sudden numbness or weakness of the face, arm or leg -trouble walking, dizziness, loss of balance or coordination -seizures (convulsions) -swelling of the ankles, feet, hands -unusually weak or tired Side effects that usually do not require medical attention (report to your doctor or health care professional if they continue or are bothersome): -diarrhea -fever, chills (flu-like symptoms) -headaches -nausea, vomiting -redness, stinging, or swelling at site where injected This list may not describe all possible side effects. Call your doctor for medical advice about side effects. You may report side effects to FDA at 1-800-FDA-1088. Where should I keep my medicine? Keep out of the reach of children. Store in a refrigerator between 2 and 8 degrees C (36 and 46 degrees F). Do not freeze. Do not shake. Throw away any unused portion if using a single-dose vial. Throw away any unused medicine after the expiration date. NOTE: This sheet is a summary. It may not cover all possible information. If you have questions about this medicine, talk to your doctor, pharmacist, or health care provider.    2016, [employer]vier/Gold Standard. (2008-01-02 10:23:57)  

## 2015-03-03 LAB — IRON AND TIBC
%SAT: 32 % (ref 21–57)
IRON: 79 ug/dL (ref 41–142)
TIBC: 246 ug/dL (ref 236–444)
UIBC: 167 ug/dL (ref 120–384)

## 2015-03-03 LAB — FERRITIN: Ferritin: 178 ng/ml (ref 9–269)

## 2015-03-07 ENCOUNTER — Ambulatory Visit: Payer: Medicare Other | Admitting: Internal Medicine

## 2015-03-14 ENCOUNTER — Ambulatory Visit: Payer: Medicare Other | Admitting: Internal Medicine

## 2015-03-19 ENCOUNTER — Encounter: Payer: Self-pay | Admitting: Neurology

## 2015-03-20 ENCOUNTER — Encounter: Payer: Self-pay | Admitting: Family Medicine

## 2015-04-11 ENCOUNTER — Telehealth: Payer: Self-pay

## 2015-04-11 NOTE — Telephone Encounter (Signed)
Home Health Cert/Plan of Care received (01/03/2015 - 03/03/2015)  and placed on MD's desk for signature

## 2015-04-22 ENCOUNTER — Telehealth: Payer: Self-pay | Admitting: Internal Medicine

## 2015-04-22 NOTE — Telephone Encounter (Signed)
Angela from [facility] called regarding orders she sent over back in December that is needing Dr. John's pt °I had her fax these back over and told her you would look out for them °Angela can be reached at [phone] °

## 2015-04-28 ENCOUNTER — Ambulatory Visit: Payer: Medicare Other | Admitting: Family

## 2015-04-28 ENCOUNTER — Ambulatory Visit: Payer: Medicare Other

## 2015-04-28 ENCOUNTER — Other Ambulatory Visit: Payer: Medicare Other

## 2015-05-01 ENCOUNTER — Encounter: Payer: Self-pay | Admitting: Internal Medicine

## 2015-05-01 MED ORDER — DARIFENACIN HYDROBROMIDE ER 7.5 MG PO TB24: 7.5000 mg | ORAL_TABLET | Freq: Every day | ORAL | Status: AC

## 2015-05-01 NOTE — Telephone Encounter (Signed)
New enablex done erx

## 2015-05-12 ENCOUNTER — Telehealth: Payer: Self-pay

## 2015-05-12 DIAGNOSIS — M545 Low back pain: Secondary | ICD-10-CM

## 2015-05-12 NOTE — Telephone Encounter (Signed)
Home Health Cert/Plan of Care received (05/03/2015 - 07/01/2015) and placed on MD's desk for signature

## 2015-05-13 NOTE — Telephone Encounter (Signed)
Paperwork signed, faxed, copy sent to scan 

## 2015-06-06 ENCOUNTER — Ambulatory Visit (HOSPITAL_BASED_OUTPATIENT_CLINIC_OR_DEPARTMENT_OTHER): Payer: Medicare Other

## 2015-06-06 ENCOUNTER — Ambulatory Visit (HOSPITAL_BASED_OUTPATIENT_CLINIC_OR_DEPARTMENT_OTHER): Payer: Medicare Other | Admitting: Family

## 2015-06-06 ENCOUNTER — Other Ambulatory Visit (HOSPITAL_BASED_OUTPATIENT_CLINIC_OR_DEPARTMENT_OTHER): Payer: Medicare Other

## 2015-06-06 ENCOUNTER — Encounter: Payer: Self-pay | Admitting: Family

## 2015-06-06 VITALS — BP 158/53 | HR 62 | Temp 98.0°F | Resp 16 | Ht 61.0 in | Wt 120.0 lb

## 2015-06-06 DIAGNOSIS — D509 Iron deficiency anemia, unspecified: Secondary | ICD-10-CM

## 2015-06-06 DIAGNOSIS — N183 Chronic kidney disease, stage 3 unspecified: Secondary | ICD-10-CM

## 2015-06-06 DIAGNOSIS — D631 Anemia in chronic kidney disease: Secondary | ICD-10-CM | POA: Diagnosis not present

## 2015-06-06 LAB — MANUAL DIFFERENTIAL (CHCC SATELLITE)
ALC: 1.2 10*3/uL (ref 0.6–2.2)
ANC (CHCC MAN DIFF): 1.7 10*3/uL (ref 1.5–6.7)
BASO: 3 % — ABNORMAL HIGH (ref 0–2)
Eos: 2 % (ref 0–7)
LYMPH: 37 % (ref 14–48)
MONO: 8 % (ref 0–13)
PLT EST ~~LOC~~: INCREASED
SEG: 50 % (ref 40–75)

## 2015-06-06 LAB — CBC WITH DIFFERENTIAL (CANCER CENTER ONLY)
HCT: 29.5 % — ABNORMAL LOW (ref 34.8–46.6)
HGB: 9.3 g/dL — ABNORMAL LOW (ref 11.6–15.9)
MCH: 30.3 pg (ref 26.0–34.0)
MCHC: 31.5 g/dL — AB (ref 32.0–36.0)
MCV: 96 fL (ref 81–101)
Platelets: 541 10*3/uL — ABNORMAL HIGH (ref 145–400)
RBC: 3.07 10*6/uL — ABNORMAL LOW (ref 3.70–5.32)
RDW: 13.8 % (ref 11.1–15.7)
WBC: 3.3 10*3/uL — ABNORMAL LOW (ref 3.9–10.0)

## 2015-06-06 MED ORDER — DARBEPOETIN ALFA 300 MCG/0.6ML IJ SOSY
300.0000 ug | PREFILLED_SYRINGE | Freq: Once | INTRAMUSCULAR | Status: AC
  Administered 2015-06-06: 300 ug via SUBCUTANEOUS

## 2015-06-06 MED ORDER — DARBEPOETIN ALFA 300 MCG/0.6ML IJ SOSY
PREFILLED_SYRINGE | INTRAMUSCULAR | Status: AC
  Filled 2015-06-06: qty 0.6

## 2015-06-06 NOTE — Patient Instructions (Signed)
Darbepoetin Alfa injection What is this medicine? DARBEPOETIN ALFA (dar be POE e tin AL fa) helps your body make more red blood cells. It is used to treat anemia caused by chronic kidney failure and chemotherapy. This medicine may be used for other purposes; ask your health care provider or pharmacist if you have questions. What should I tell my health care provider before I take this medicine? They need to know if you have any of these conditions: -blood clotting disorders or history of blood clots -cancer patient not on chemotherapy -cystic fibrosis -heart disease, such as angina, heart failure, or a history of a heart attack -hemoglobin level of 12 g/dL or greater -high blood pressure -low levels of folate, iron, or vitamin B12 -seizures -an unusual or allergic reaction to darbepoetin, erythropoietin, albumin, hamster proteins, latex, other medicines, foods, dyes, or preservatives -pregnant or trying to get pregnant -breast-feeding How should I use this medicine? This medicine is for injection into a vein or under the skin. It is usually given by a health care professional in a hospital or clinic setting. If you get this medicine at home, you will be taught how to prepare and give this medicine. Do not shake the solution before you withdraw a dose. Use exactly as directed. Take your medicine at regular intervals. Do not take your medicine more often than directed. It is important that you put your used needles and syringes in a special sharps container. Do not put them in a trash can. If you do not have a sharps container, call your pharmacist or healthcare provider to get one. Talk to your pediatrician regarding the use of this medicine in children. While this medicine may be used in children as young as 1 year for selected conditions, precautions do apply. Overdosage: If you think you have taken too much of this medicine contact a poison control center or emergency room at once. NOTE:  This medicine is only for you. Do not share this medicine with others. What if I miss a dose? If you miss a dose, take it as soon as you can. If it is almost time for your next dose, take only that dose. Do not take double or extra doses. What may interact with this medicine? Do not take this medicine with any of the following medications: -epoetin alfa This list may not describe all possible interactions. Give your health care provider a list of all the medicines, herbs, non-prescription drugs, or dietary supplements you use. Also tell them if you smoke, drink alcohol, or use illegal drugs. Some items may interact with your medicine. What should I watch for while using this medicine? Visit your prescriber or health care professional for regular checks on your progress and for the needed blood tests and blood pressure measurements. It is especially important for the doctor to make sure your hemoglobin level is in the desired range, to limit the risk of potential side effects and to give you the best benefit. Keep all appointments for any recommended tests. Check your blood pressure as directed. Ask your doctor what your blood pressure should be and when you should contact him or her. As your body makes more red blood cells, you may need to take iron, folic acid, or vitamin B supplements. Ask your doctor or health care provider which products are right for you. If you have kidney disease continue dietary restrictions, even though this medication can make you feel better. Talk with your doctor or health care professional about the   foods you eat and the vitamins that you take. What side effects may I notice from receiving this medicine? Side effects that you should report to your doctor or health care professional as soon as possible: -allergic reactions like skin rash, itching or hives, swelling of the face, lips, or tongue -breathing problems -changes in vision -chest pain -confusion, trouble speaking  or understanding -feeling faint or lightheaded, falls -high blood pressure -muscle aches or pains -pain, swelling, warmth in the leg -rapid weight gain -severe headaches -sudden numbness or weakness of the face, arm or leg -trouble walking, dizziness, loss of balance or coordination -seizures (convulsions) -swelling of the ankles, feet, hands -unusually weak or tired Side effects that usually do not require medical attention (report to your doctor or health care professional if they continue or are bothersome): -diarrhea -fever, chills (flu-like symptoms) -headaches -nausea, vomiting -redness, stinging, or swelling at site where injected This list may not describe all possible side effects. Call your doctor for medical advice about side effects. You may report side effects to FDA at 1-800-FDA-1088. Where should I keep my medicine? Keep out of the reach of children. Store in a refrigerator between 2 and 8 degrees C (36 and 46 degrees F). Do not freeze. Do not shake. Throw away any unused portion if using a single-dose vial. Throw away any unused medicine after the expiration date. NOTE: This sheet is a summary. It may not cover all possible information. If you have questions about this medicine, talk to your doctor, pharmacist, or health care provider.    2016, [employer]vier/Gold Standard. (2008-01-02 10:23:57)  

## 2015-06-06 NOTE — Progress Notes (Signed)
Hematology and Oncology Follow Up Visit  ANSLEI KLAMER [id number] Apr 13, 1922 82 y.o. 06/06/2015   Principle Diagnosis:  Anemia of renal insufficiency Iron deficiency anemia  Current Therapy:   IV iron as indicated Aranesp 300 g subcutaneous for hemoglobin less than 10    Interim History:  Ms. Brearley is here today with her daughter for a follow-up. She is feeling quite fatigued at this time. Her Hgb today is 9.3 with an MCV of 96.  In January, her iron saturation was 33% with a ferritin of 178.  No fever, chills, n/v, cough, rash, dizziness, SOB, chest pain, palpitations, abdominal pain or changes in bowel or bladder habits. She still has some lower back discomfort that comes and goes. She takes Ultram as needed for pain.  She has some "puffiness" in the right ankle that comes and goes. It improves when she elevates her leg. No numbness or tingling in her extremities.  No lymphadenopathy found on exam. No episodes of bleeding or bruising.  She is eating fairly well and staying hydrated. She does not care for the food at her assisted living facility. Her weight is unchanged.     Medications:    Medication List       This list is accurate as of: 06/06/15  1:48 PM.  Always use your most recent med list.               aluminum-magnesium hydroxide-simethicone 200-200-20 MG/5ML Susp  Commonly known as:  MAALOX  2 teaspoons by mouth 3 times daily as needed for heartburn     amLODipine 5 MG tablet  Commonly known as:  NORVASC  Take 5 mg by mouth daily.     aspirin 81 MG tablet  Take 81 mg by mouth daily.     atorvastatin 20 MG tablet  Commonly known as:  LIPITOR  Take 20 mg by mouth daily at 6 PM.     cholecalciferol 1000 units tablet  Commonly known as:  VITAMIN D  Take 2,000 Units by mouth daily.     clindamycin 150 MG capsule  Commonly known as:  CLEOCIN  4 by mouth 1 hour prior to dental procedures     darifenacin 7.5 MG 24 hr tablet  Commonly known as:   ENABLEX  Take 1 tablet (7.5 mg total) by mouth daily.     donepezil 10 MG tablet  Commonly known as:  ARICEPT  Take 1/2 tablet daily for 2 weeks, then increase to 1 tablet daily     gabapentin 100 MG capsule  Commonly known as:  NEURONTIN  Take 1 capsule (100 mg total) by mouth 3 (three) times daily.     hydrALAZINE 25 MG tablet  Commonly known as:  APRESOLINE  Take 1 tablet (25 mg total) by mouth 3 (three) times daily.     levothyroxine 100 MCG tablet  Commonly known as:  SYNTHROID, LEVOTHROID  Take 100 mcg by mouth daily before breakfast.     magnesium oxide 400 MG tablet  Commonly known as:  MAG-OX  Take 400 mg by mouth daily.     Melatonin 3 MG Tabs  Take by mouth. 2 by mouth at bedtime     pantoprazole 40 MG tablet  Commonly known as:  PROTONIX  Take 1 tablet (40 mg total) by mouth daily.     polyethylene glycol packet  Commonly known as:  MIRALAX / GLYCOLAX  Take 17 g by mouth daily.     senna-docusate 8.6-50 MG tablet  Commonly known as:  Senokot-S  Take 1-2 tablets, IF NEEDED, every 12 hrs for constipation     traMADol 50 MG tablet  Commonly known as:  ULTRAM  Take one tablet by mouth twice daily for pain     TYLENOL 325 MG Caps  Generic drug:  Acetaminophen  Take 325 mg by mouth 3 (three) times daily after meals.     vitamin B-12 1000 MCG tablet  Commonly known as:  CYANOCOBALAMIN  Take 1,000 mcg by mouth daily.        Allergies:  Allergies  Allergen Reactions  . Allegra Allergy [Fexofenadine Hcl] Anaphylaxis  . Amoxicillin   . Penicillins   . Sulfa Antibiotics     Past Medical History, Surgical history, Social history, and Family History were reviewed and updated.  Review of Systems: All other 10 point review of systems is negative.   Physical Exam:  vitals were not taken for this visit.  Wt Readings from Last 3 Encounters:  02/28/15 116 lb (52.617 kg)  02/06/15 117 lb (53.071 kg)  01/01/15 112 lb (50.803 kg)    Ocular: Sclerae  unicteric, pupils equal, round and reactive to light Ear-nose-throat: Oropharynx clear, dentition fair Lymphatic: No cervical, supraclavicular or axillary adenopathy Lungs no rales or rhonchi, good excursion bilaterally Heart regular rate and rhythm, no murmur appreciated Abd soft, nontender, positive bowel sounds, no liver or spleen tip palpated on exam, no fluid wave MSK no focal spinal tenderness, no joint edema Neuro: non-focal, well-oriented, appropriate affect Breasts: Deferred  Lab Results  Component Value Date   WBC 3.1* 02/28/2015   HGB 9.8* 02/28/2015   HCT 30.6* 02/28/2015   MCV 97 02/28/2015   PLT 264 02/28/2015   Lab Results  Component Value Date   FERRITIN 178 02/28/2015   IRON 79 02/28/2015   TIBC 246 02/28/2015   UIBC 167 02/28/2015   IRONPCTSAT 32 02/28/2015   Lab Results  Component Value Date   RETICCTPCT 1.7 12/13/2014   RBC 3.15* 02/28/2015   RETICCTABS 60.5 12/13/2014   No results found for: KPAFRELGTCHN, LAMBDASER, KAPLAMBRATIO No results found for: Kandis Cocking, IGMSERUM No results found for: Odetta Pink, SPEI   Chemistry      Component Value Date/Time   NA 136 11/22/2014 1159   NA 137 10/04/2014 1333   NA 135 07/05/2014 1622   K 4.0 11/22/2014 1159   K 4.5 10/04/2014 1333   CL 101 11/22/2014 1159   CL 106 10/04/2014 1333   CO2 28 11/22/2014 1159   CO2 25 10/04/2014 1333   BUN 21 11/22/2014 1159   BUN 25* 10/04/2014 1333   BUN 24 07/05/2014 1622   CREATININE 1.68* 11/22/2014 1159   CREATININE 1.8* 10/04/2014 1333      Component Value Date/Time   CALCIUM 11.3* 11/22/2014 1159   CALCIUM 10.8* 10/04/2014 1333   ALKPHOS 70 11/22/2014 1159   ALKPHOS 59 10/04/2014 1333   AST 15 11/22/2014 1159   AST 19 10/04/2014 1333   ALT 8 11/22/2014 1159   ALT 10 10/04/2014 1333   BILITOT 0.4 11/22/2014 1159   BILITOT 0.40 10/04/2014 1333   BILITOT 0.2 07/05/2014 1622     Impression and  Plan: Ms. Agreda is a 82 yo white female with multifactorial anemia. She is symptomatic with fatigue at this time.  Her Hgb today is 9.3 with an MCV of 96. She will get a dose of Aranesp today.  We will see what her iron  studies show and bring her back in next week for [facility] if needed.  We will plan to see her back in 4 months for labs and follow-up.  Both she and her daughter know to contact us with any questions or concerns. We can certainly see her sooner if need be.   Eliezer Bottom, NP 5/5/20171:48 PM

## 2015-06-07 LAB — RETICULOCYTES: RETICULOCYTE COUNT: 1.3 % (ref 0.6–2.6)

## 2015-06-09 LAB — IRON AND TIBC
%SAT: 21 % (ref 21–57)
IRON: 45 ug/dL (ref 41–142)
TIBC: 214 ug/dL — ABNORMAL LOW (ref 236–444)
UIBC: 169 ug/dL (ref 120–384)

## 2015-06-09 LAB — FERRITIN: FERRITIN: 241 ng/mL (ref 9–269)

## 2015-07-04 ENCOUNTER — Ambulatory Visit: Payer: Medicare Other | Admitting: Neurology

## 2015-08-04 ENCOUNTER — Encounter: Payer: Self-pay | Admitting: Internal Medicine

## 2015-08-06 ENCOUNTER — Telehealth: Payer: Self-pay | Admitting: Internal Medicine

## 2015-08-06 MED ORDER — ACETAMINOPHEN 500 MG PO TABS: 500.0000 mg | ORAL_TABLET | Freq: Four times a day (QID) | ORAL | Status: DC | PRN

## 2015-08-06 NOTE — Telephone Encounter (Signed)
Please advise 

## 2015-08-06 NOTE — Telephone Encounter (Signed)
They called back. I informed them it had been. At that time she had just got the fax. All good now, Thank you.

## 2015-08-06 NOTE — Telephone Encounter (Signed)
Done erx 

## 2015-08-06 NOTE — Telephone Encounter (Signed)
[facility] request refill for Acetaminophen (TYLENOL) 500 mg (Dr. Tamala Julian increase this to 500 since 01/08/2015) but now Dr. Jenny Reichmann take over her care. Please help

## 2015-08-06 NOTE — Telephone Encounter (Signed)
Done hardcopy to Allison Page - for melatonin 2 mg qhs 9PM only

## 2015-08-07 NOTE — Telephone Encounter (Signed)
LVM letting pt know.  

## 2015-09-18 ENCOUNTER — Telehealth: Payer: Self-pay

## 2015-09-18 NOTE — Telephone Encounter (Signed)
[facility] has sent over a form to fill out and fax 3 times now, pcp has signed form all three times and I have faxed form to appropriate number.

## 2015-10-07 ENCOUNTER — Encounter: Payer: Self-pay | Admitting: Internal Medicine

## 2015-10-07 MED ORDER — TRIAMCINOLONE ACETONIDE 55 MCG/ACT NA AERO: 2.0000 | INHALATION_SPRAY | Freq: Every day | NASAL | 12 refills | Status: DC

## 2015-10-07 MED ORDER — FEXOFENADINE HCL 180 MG PO TABS: 180.0000 mg | ORAL_TABLET | Freq: Every day | ORAL | 11 refills | Status: DC

## 2015-10-08 MED ORDER — FEXOFENADINE HCL 180 MG PO TABS: 180.0000 mg | ORAL_TABLET | Freq: Every day | ORAL | 11 refills | Status: DC

## 2015-10-08 MED ORDER — TRIAMCINOLONE ACETONIDE 55 MCG/ACT NA AERO: 2.0000 | INHALATION_SPRAY | Freq: Every day | NASAL | 12 refills | Status: DC

## 2015-10-08 NOTE — Telephone Encounter (Signed)
Done hardcopy to Corinne  

## 2015-10-08 NOTE — Addendum Note (Signed)
Addended by: Biagio Borg on: 10/08/2015 12:41 PM   Modules accepted: Orders

## 2015-10-10 ENCOUNTER — Other Ambulatory Visit (HOSPITAL_BASED_OUTPATIENT_CLINIC_OR_DEPARTMENT_OTHER): Payer: Medicare Other

## 2015-10-10 ENCOUNTER — Encounter: Payer: Self-pay | Admitting: Family

## 2015-10-10 ENCOUNTER — Ambulatory Visit (HOSPITAL_BASED_OUTPATIENT_CLINIC_OR_DEPARTMENT_OTHER)
Admission: RE | Admit: 2015-10-10 | Discharge: 2015-10-10 | Disposition: A | Discharge status: Home / Self Care | Payer: Medicare Other | Source: Ambulatory Visit | Attending: Family | Admitting: Family

## 2015-10-10 ENCOUNTER — Ambulatory Visit (HOSPITAL_BASED_OUTPATIENT_CLINIC_OR_DEPARTMENT_OTHER): Payer: Medicare Other | Admitting: Family

## 2015-10-10 VITALS — BP 153/50 | HR 64 | Temp 98.1°F | Resp 16 | Ht 61.0 in | Wt 124.0 lb

## 2015-10-10 DIAGNOSIS — N183 Chronic kidney disease, stage 3 unspecified: Secondary | ICD-10-CM

## 2015-10-10 DIAGNOSIS — M7121 Synovial cyst of popliteal space [Baker], right knee: Secondary | ICD-10-CM | POA: Diagnosis not present

## 2015-10-10 DIAGNOSIS — M7989 Other specified soft tissue disorders: Secondary | ICD-10-CM | POA: Insufficient documentation

## 2015-10-10 DIAGNOSIS — D631 Anemia in chronic kidney disease: Secondary | ICD-10-CM

## 2015-10-10 DIAGNOSIS — D509 Iron deficiency anemia, unspecified: Secondary | ICD-10-CM | POA: Diagnosis not present

## 2015-10-10 DIAGNOSIS — M79604 Pain in right leg: Secondary | ICD-10-CM | POA: Diagnosis present

## 2015-10-10 LAB — MANUAL DIFFERENTIAL (CHCC SATELLITE)
ALC: 0.9 10*3/uL (ref 0.6–2.2)
ANC (CHCC HP manual diff): 1.6 10*3/uL (ref 1.5–6.7)
BASO: 1 % (ref 0–2)
LYMPH: 33 % (ref 14–48)
MONO: 10 % (ref 0–13)
PLT EST ~~LOC~~: ADEQUATE
SEG: 56 % (ref 40–75)

## 2015-10-10 LAB — CBC WITH DIFFERENTIAL (CANCER CENTER ONLY)
HEMATOCRIT: 32.3 % — AB (ref 34.8–46.6)
HGB: 10.5 g/dL — ABNORMAL LOW (ref 11.6–15.9)
MCH: 30.9 pg (ref 26.0–34.0)
MCHC: 32.5 g/dL (ref 32.0–36.0)
MCV: 95 fL (ref 81–101)
PLATELETS: 342 10*3/uL (ref 145–400)
RBC: 3.4 10*6/uL — ABNORMAL LOW (ref 3.70–5.32)
RDW: 13.7 % (ref 11.1–15.7)
WBC: 2.8 10*3/uL — AB (ref 3.9–10.0)

## 2015-10-10 NOTE — Progress Notes (Signed)
Hematology and Oncology Follow Up Visit  Allison Page [id number] Dec 15, 1922 82 y.o. 10/10/2015   Principle Diagnosis:  Anemia of chronic renal insufficiency - stage III Iron deficiency anemia  Current Therapy:   IV iron as indicated Aranesp 300 g subcutaneous for hemoglobin less than 10    Interim History:  Allison Page is here today with her daughter for a follow-up. She is c/o swelling and tenderness in her right lower extremity. Her leg is quite swollen below the knee with plus 3 pitting edema. Pedal pulse is +1. No leg weakness.  She is on 1 baby aspirin daily.  No fever, chills, n/v, cough, rash, dizziness, SOB, chest pain, palpitations, abdominal pain or changes in bowel or bladder habits. No numbness or tingling in her extremities.  No lymphadenopathy found on exam. No episodes of bleeding or bruising.  She has maintained a good appetite despite not liking the food at her assisted living facility. She is staying well hydrated and her weight is stable.   Medications:    Medication List       Accurate as of 10/10/15  1:49 PM. Always use your most recent med list.          acetaminophen 500 MG tablet Commonly known as:  TYLENOL Take 1 tablet (500 mg total) by mouth every 6 (six) hours as needed.   aluminum-magnesium hydroxide-simethicone [id number] MG/5ML Susp Commonly known as:  MAALOX 2 teaspoons by mouth 3 times daily as needed for heartburn   amLODipine 5 MG tablet Commonly known as:  NORVASC Take 5 mg by mouth daily.   aspirin 81 MG tablet Take 81 mg by mouth daily.   atorvastatin 20 MG tablet Commonly known as:  LIPITOR Take 20 mg by mouth daily at 6 PM.   clindamycin 150 MG capsule Commonly known as:  CLEOCIN 4 by mouth 1 hour prior to dental procedures   darifenacin 7.5 MG 24 hr tablet Commonly known as:  ENABLEX Take 1 tablet (7.5 mg total) by mouth daily.   donepezil 10 MG tablet Commonly known as:  ARICEPT Take 1/2 tablet daily for 2  weeks, then increase to 1 tablet daily   fexofenadine 180 MG tablet Commonly known as:  ALLEGRA Take 1 tablet (180 mg total) by mouth daily.   gabapentin 100 MG capsule Commonly known as:  NEURONTIN Take 1 capsule (100 mg total) by mouth 3 (three) times daily.   hydrALAZINE 25 MG tablet Commonly known as:  APRESOLINE Take 1 tablet (25 mg total) by mouth 3 (three) times daily.   levothyroxine 100 MCG tablet Commonly known as:  SYNTHROID, LEVOTHROID Take 100 mcg by mouth daily before breakfast.   magnesium oxide 400 MG tablet Commonly known as:  MAG-OX Take 400 mg by mouth daily.   Melatonin 3 MG Tabs Take by mouth. 2 by mouth at bedtime   pantoprazole 40 MG tablet Commonly known as:  PROTONIX Take 1 tablet (40 mg total) by mouth daily.   polyethylene glycol packet Commonly known as:  MIRALAX / GLYCOLAX Take 17 g by mouth daily.   senna-docusate 8.6-50 MG tablet Commonly known as:  Senokot-S Take 1-2 tablets, IF NEEDED, every 12 hrs for constipation   traMADol 50 MG tablet Commonly known as:  ULTRAM Take one tablet by mouth twice daily for pain   triamcinolone 55 MCG/ACT Aero nasal inhaler Commonly known as:  NASACORT AQ Place 2 sprays into the nose daily.   vitamin B-12 1000 MCG tablet Commonly known as:  CYANOCOBALAMIN Take 1,000 mcg by mouth daily.       Allergies:  Allergies  Allergen Reactions  . Allegra Allergy [Fexofenadine Hcl] Anaphylaxis  . Amoxicillin   . Penicillins   . Sulfa Antibiotics     Past Medical History, Surgical history, Social history, and Family History were reviewed and updated.  Review of Systems: All other 10 point review of systems is negative.   Physical Exam:  height is 5\' 1"  (1.549 m) and weight is 124 lb (56.2 kg). Her oral temperature is 98.1 F (36.7 C). Her blood pressure is 153/50 (abnormal) and her pulse is 64. Her respiration is 16.   Wt Readings from Last 3 Encounters:  10/10/15 124 lb (56.2 kg)  06/06/15 120  lb (54.4 kg)  02/28/15 116 lb (52.6 kg)    Ocular: Sclerae unicteric, pupils equal, round and reactive to light Ear-nose-throat: Oropharynx clear, dentition fair Lymphatic: No cervical, supraclavicular or axillary adenopathy Lungs no rales or rhonchi, good excursion bilaterally Heart regular rate and rhythm, no murmur appreciated Abd soft, nontender, positive bowel sounds, no liver or spleen tip palpated on exam, no fluid wave MSK no focal spinal tenderness, no joint edema Neuro: non-focal, well-oriented, appropriate affect Breasts: Deferred  Lab Results  Component Value Date   WBC 3.3 (L) 06/06/2015   HGB 9.3 (L) 06/06/2015   HCT 29.5 (L) 06/06/2015   MCV 96 06/06/2015   PLT 541 (H) 06/06/2015   Lab Results  Component Value Date   FERRITIN 241 06/06/2015   IRON 45 06/06/2015   TIBC 214 (L) 06/06/2015   UIBC 169 06/06/2015   IRONPCTSAT 21 06/06/2015   Lab Results  Component Value Date   RETICCTPCT 1.7 12/13/2014   RBC 3.07 (L) 06/06/2015   RETICCTABS 60.5 12/13/2014   No results found for: KPAFRELGTCHN, LAMBDASER, KAPLAMBRATIO No results found for: IGGSERUM, IGA, IGMSERUM No results found for: Odetta Pink, SPEI   Chemistry      Component Value Date/Time   NA 136 11/22/2014 1159   NA 137 10/04/2014 1333   K 4.0 11/22/2014 1159   K 4.5 10/04/2014 1333   CL 101 11/22/2014 1159   CL 106 10/04/2014 1333   CO2 28 11/22/2014 1159   CO2 25 10/04/2014 1333   BUN 21 11/22/2014 1159   BUN 25 (H) 10/04/2014 1333   CREATININE 1.68 (H) 11/22/2014 1159   CREATININE 1.8 (H) 10/04/2014 1333      Component Value Date/Time   CALCIUM 11.3 (H) 11/22/2014 1159   CALCIUM 10.8 (H) 10/04/2014 1333   ALKPHOS 70 11/22/2014 1159   ALKPHOS 59 10/04/2014 1333   AST 15 11/22/2014 1159   AST 19 10/04/2014 1333   ALT 8 11/22/2014 1159   ALT 10 10/04/2014 1333   BILITOT 0.4 11/22/2014 1159   BILITOT 0.40 10/04/2014 1333      Impression and Plan: Allison Page is a 82 yo white female with multifactorial anemia (iron deficiency and anemia of chronic renal insufficiency). She has significant swelling with pitting edema in the right lower extremity, pedal pulse is +1.  [place] was negative for DVT. She does have a 4.8 cm baker's cyst. We will refer her to Dr. Novella Olive for evaluation and treatment.  No Aranesp today. Hgb stable at 10.5 and MCV 95.  We will see what her iron studies show and bring her back in next week for Feraheme if needed.  We will plan to see her back in 4 months for  labs and follow-up.  Both she and her daughter know to contact us with any questions or concerns. We can certainly see her sooner if need be.   Eliezer Bottom, NP 9/8/20171:49 PM

## 2015-10-11 LAB — RETICULOCYTES: Reticulocyte Count: 1.4 % (ref 0.6–2.6)

## 2015-10-13 LAB — FERRITIN: FERRITIN: 105 ng/mL (ref 9–269)

## 2015-10-13 LAB — IRON AND TIBC
%SAT: 30 % (ref 21–57)
Iron: 72 ug/dL (ref 41–142)
TIBC: 241 ug/dL (ref 236–444)
UIBC: 170 ug/dL (ref 120–384)

## 2015-10-14 ENCOUNTER — Encounter: Payer: Self-pay | Admitting: *Deleted

## 2015-10-17 ENCOUNTER — Encounter: Payer: Self-pay | Admitting: Hematology & Oncology

## 2015-10-22 ENCOUNTER — Encounter: Payer: Self-pay | Admitting: Internal Medicine

## 2015-10-27 ENCOUNTER — Telehealth: Payer: Self-pay | Admitting: Neurology

## 2015-10-27 NOTE — Telephone Encounter (Signed)
Patient daughter Allison Page wants to talk to someone about her mother she had appt on 11-07-15 and states that she is doing great and wants to know if she needs to come in please call [phone]

## 2015-10-27 NOTE — Telephone Encounter (Signed)
Pt made appt for 12/22/15.

## 2015-10-27 NOTE — Telephone Encounter (Signed)
Pls let her know we need to do yearly visits when we prescribe medication. If she would like to ask her PCP to prescribe the Aricept, can push back visit here. Thanks

## 2015-10-27 NOTE — Telephone Encounter (Signed)
Pt has not been seen since 01/01/15. She is on Aricept. Pt would need to keep appt?

## 2015-11-07 ENCOUNTER — Ambulatory Visit: Payer: Medicare Other | Admitting: Neurology

## 2015-11-10 ENCOUNTER — Telehealth: Payer: Self-pay

## 2015-11-10 NOTE — Telephone Encounter (Signed)
Home Health Cert/Plan of Care received (10/27/2015 - 12/25/2015) and placed on MD's desk for signature

## 2015-11-11 NOTE — Telephone Encounter (Signed)
Paperwork signed, faxed, copy sent to scan 

## 2015-11-17 ENCOUNTER — Telehealth: Payer: Self-pay | Admitting: Emergency Medicine

## 2015-11-17 NOTE — Telephone Encounter (Signed)
Home Health called and wants verbal orders to continue PT twice a week for the next 5 weeks. Thanks.

## 2015-11-18 NOTE — Telephone Encounter (Signed)
Done

## 2015-11-25 DIAGNOSIS — R609 Edema, unspecified: Secondary | ICD-10-CM | POA: Diagnosis not present

## 2015-11-25 DIAGNOSIS — E119 Type 2 diabetes mellitus without complications: Secondary | ICD-10-CM

## 2015-11-25 DIAGNOSIS — L729 Follicular cyst of the skin and subcutaneous tissue, unspecified: Secondary | ICD-10-CM | POA: Diagnosis not present

## 2015-11-25 DIAGNOSIS — I10 Essential (primary) hypertension: Secondary | ICD-10-CM

## 2015-11-25 DIAGNOSIS — R531 Weakness: Secondary | ICD-10-CM | POA: Diagnosis not present

## 2015-12-05 ENCOUNTER — Encounter: Payer: Self-pay | Admitting: Internal Medicine

## 2015-12-05 DIAGNOSIS — D500 Iron deficiency anemia secondary to blood loss (chronic): Secondary | ICD-10-CM

## 2015-12-05 DIAGNOSIS — D61818 Other pancytopenia: Secondary | ICD-10-CM

## 2015-12-05 MED ORDER — TRAMADOL HCL 50 MG PO TABS: ORAL_TABLET | ORAL | 1 refills | Status: DC

## 2015-12-05 NOTE — Telephone Encounter (Signed)
Tramadol Done hardcopy to Corinne  

## 2015-12-11 ENCOUNTER — Telehealth: Payer: Self-pay | Admitting: Neurology

## 2015-12-11 NOTE — Telephone Encounter (Signed)
Contacted patient's daughter. She had to cancel her mother's appointment on 12/22/15 and she's rescheduled for 06/09/15 (also on wait list) and wanted to be sure her mother would get refill on Aricept.

## 2015-12-11 NOTE — Telephone Encounter (Signed)
Patient daughter [name] called [phone] has question about medication

## 2015-12-18 ENCOUNTER — Telehealth: Payer: Self-pay | Admitting: Internal Medicine

## 2015-12-18 NOTE — Telephone Encounter (Signed)
rx Done hardcopy to Corinne  OK for verbals as well

## 2015-12-18 NOTE — Telephone Encounter (Signed)
Allison Page is looking for a script for a tens unit faxed to #[phone]  She also needs a verbal for PT  2 week 4

## 2015-12-19 NOTE — Telephone Encounter (Signed)
Verbal given, Rx faxed

## 2015-12-22 ENCOUNTER — Ambulatory Visit: Payer: Medicare Other | Admitting: Neurology

## 2016-01-19 ENCOUNTER — Telehealth: Payer: Self-pay | Admitting: Internal Medicine

## 2016-01-19 NOTE — Telephone Encounter (Signed)
Wants to put patient on hold for next week b/c patient will be with family then wants to continue therapy for twice a week for three weeks after that.

## 2016-01-20 DIAGNOSIS — I10 Essential (primary) hypertension: Secondary | ICD-10-CM | POA: Diagnosis not present

## 2016-01-20 DIAGNOSIS — R531 Weakness: Secondary | ICD-10-CM | POA: Diagnosis not present

## 2016-01-20 DIAGNOSIS — L729 Follicular cyst of the skin and subcutaneous tissue, unspecified: Secondary | ICD-10-CM | POA: Diagnosis not present

## 2016-01-20 DIAGNOSIS — E119 Type 2 diabetes mellitus without complications: Secondary | ICD-10-CM | POA: Diagnosis not present

## 2016-01-20 DIAGNOSIS — R609 Edema, unspecified: Secondary | ICD-10-CM | POA: Diagnosis not present

## 2016-01-20 NOTE — Telephone Encounter (Signed)
Ok for verbal 

## 2016-01-20 NOTE — Telephone Encounter (Signed)
Verbals given  

## 2016-01-21 ENCOUNTER — Encounter: Payer: Self-pay | Admitting: Internal Medicine

## 2016-01-21 ENCOUNTER — Telehealth: Payer: Self-pay | Admitting: Internal Medicine

## 2016-01-21 MED ORDER — GUAIFENESIN ER 600 MG PO TB12: 600.0000 mg | ORAL_TABLET | Freq: Two times a day (BID) | ORAL | 1 refills | Status: DC | PRN

## 2016-01-21 NOTE — Telephone Encounter (Signed)
Done hardcopy to Corinne  

## 2016-01-21 NOTE — Telephone Encounter (Signed)
Zacarias Pontes called to advise that the patient is running a fever of 100.6 w/ a deep cough

## 2016-01-22 ENCOUNTER — Telehealth: Payer: Self-pay | Admitting: Internal Medicine

## 2016-01-22 NOTE — Telephone Encounter (Signed)
Corrine can we help with this, thanks

## 2016-01-22 NOTE — Telephone Encounter (Signed)
Patients daughter called to provide you with the fax # that you requested Fax : [phone]  Please call amanda if there are any issues or if she needs to come and [pick it up.

## 2016-01-22 NOTE — Telephone Encounter (Signed)
This has been faxed again

## 2016-01-23 ENCOUNTER — Other Ambulatory Visit: Payer: Self-pay

## 2016-01-23 MED ORDER — GUAIFENESIN ER 600 MG PO TB12
600.0000 mg | ORAL_TABLET | Freq: Two times a day (BID) | ORAL | 1 refills | Status: DC | PRN
Start: 2016-01-23 — End: 2017-04-25

## 2016-01-23 NOTE — Progress Notes (Signed)
Printed for patients daughter to pick up

## 2016-02-03 ENCOUNTER — Encounter: Payer: Self-pay | Admitting: *Deleted

## 2016-02-13 ENCOUNTER — Other Ambulatory Visit: Payer: Medicare Other

## 2016-02-13 ENCOUNTER — Ambulatory Visit: Payer: Medicare Other | Admitting: Family

## 2016-02-26 ENCOUNTER — Other Ambulatory Visit (HOSPITAL_BASED_OUTPATIENT_CLINIC_OR_DEPARTMENT_OTHER): Payer: Medicare Other

## 2016-02-26 ENCOUNTER — Ambulatory Visit (HOSPITAL_BASED_OUTPATIENT_CLINIC_OR_DEPARTMENT_OTHER): Payer: Medicare Other | Admitting: Family

## 2016-02-26 VITALS — BP 147/54 | HR 72 | Temp 98.1°F | Resp 18 | Wt 121.0 lb

## 2016-02-26 DIAGNOSIS — N183 Chronic kidney disease, stage 3 unspecified: Secondary | ICD-10-CM

## 2016-02-26 DIAGNOSIS — M7121 Synovial cyst of popliteal space [Baker], right knee: Secondary | ICD-10-CM

## 2016-02-26 DIAGNOSIS — D508 Other iron deficiency anemias: Secondary | ICD-10-CM | POA: Diagnosis present

## 2016-02-26 DIAGNOSIS — D631 Anemia in chronic kidney disease: Secondary | ICD-10-CM

## 2016-02-26 DIAGNOSIS — M79604 Pain in right leg: Secondary | ICD-10-CM

## 2016-02-26 DIAGNOSIS — D509 Iron deficiency anemia, unspecified: Secondary | ICD-10-CM | POA: Diagnosis present

## 2016-02-26 DIAGNOSIS — M7989 Other specified soft tissue disorders: Principal | ICD-10-CM

## 2016-02-26 LAB — CBC WITH DIFFERENTIAL (CANCER CENTER ONLY)
HEMATOCRIT: 33.5 % — AB (ref 34.8–46.6)
HGB: 10.5 g/dL — ABNORMAL LOW (ref 11.6–15.9)
MCH: 30 pg (ref 26.0–34.0)
MCHC: 31.3 g/dL — AB (ref 32.0–36.0)
MCV: 96 fL (ref 81–101)
Platelets: 272 10*3/uL (ref 145–400)
RBC: 3.5 10*6/uL — ABNORMAL LOW (ref 3.70–5.32)
RDW: 14.1 % (ref 11.1–15.7)
WBC: 2.9 10*3/uL — ABNORMAL LOW (ref 3.9–10.0)

## 2016-02-26 LAB — MANUAL DIFFERENTIAL (CHCC SATELLITE)
ALC: 0.9 10*3/uL (ref 0.6–2.2)
ANC (CHCC MAN DIFF): 1.6 10*3/uL (ref 1.5–6.7)
BASO: 3 % — AB (ref 0–2)
Band Neutrophils: 4 % (ref 0–10)
Eos: 8 % — ABNORMAL HIGH (ref 0–7)
LYMPH: 32 % (ref 14–48)
MONO: 3 % (ref 0–13)
PLT EST ~~LOC~~: ADEQUATE
RBC COMMENTS: NORMAL
SEG: 50 % (ref 40–75)

## 2016-02-26 NOTE — Progress Notes (Signed)
Hematology and Oncology Follow Up Visit  Allison Page [id number] 1922-12-12 83 y.o. 02/26/2016   Principle Diagnosis:  Anemia of chronic renal insufficiency - stage III Iron deficiency anemia  Current Therapy:   IV iron as indicated - last received in February 2016 Aranesp 300 g subcutaneous for hemoglobin less than 10    Interim History:  Allison Page is here today with her daughter for a follow-up. She is doing well and has no complaints at this time. Hgb is stable and WBC count is ok at 2.9. She has had no issue with infections. No fever, chills, n/v, cough, rash, dizziness, SOB, chest pain, palpitations, abdominal pain or changes in bowel or bladder habits. No numbness or tingling in her extremities. She has some puffiness in her right foot and ankle due to a Baker's cyst that burst. She wears a compression stocking as needed and this is slowly improving.  No lymphadenopathy found on exam. No episodes of bleeding or bruising.  She has maintained a good appetite and is staying well hydrated. Her weight is stable.   Medications:  Allergies as of 02/26/2016      Reactions   Allegra Allergy [fexofenadine Hcl] Anaphylaxis   Amoxicillin    Penicillins    Sulfa Antibiotics       Medication List       Accurate as of 02/26/16  3:59 PM. Always use your most recent med list.          acetaminophen 500 MG tablet Commonly known as:  TYLENOL Take 1 tablet (500 mg total) by mouth every 6 (six) hours as needed.   aluminum-magnesium hydroxide-simethicone [id number] MG/5ML Susp Commonly known as:  MAALOX 2 teaspoons by mouth 3 times daily as needed for heartburn   amLODipine 5 MG tablet Commonly known as:  NORVASC Take 5 mg by mouth daily.   aspirin 81 MG tablet Take 81 mg by mouth daily.   atorvastatin 20 MG tablet Commonly known as:  LIPITOR Take 20 mg by mouth daily at 6 PM.   clindamycin 150 MG capsule Commonly known as:  CLEOCIN 4 by mouth 1 hour prior to dental  procedures   darifenacin 7.5 MG 24 hr tablet Commonly known as:  ENABLEX Take 1 tablet (7.5 mg total) by mouth daily.   donepezil 10 MG tablet Commonly known as:  ARICEPT Take 1/2 tablet daily for 2 weeks, then increase to 1 tablet daily   fexofenadine 180 MG tablet Commonly known as:  ALLEGRA Take 1 tablet (180 mg total) by mouth daily.   gabapentin 100 MG capsule Commonly known as:  NEURONTIN Take 1 capsule (100 mg total) by mouth 3 (three) times daily.   guaiFENesin 600 MG 12 hr tablet Commonly known as:  MUCINEX Take 1 tablet (600 mg total) by mouth 2 (two) times daily as needed for cough or to loosen phlegm.   hydrALAZINE 25 MG tablet Commonly known as:  APRESOLINE Take 1 tablet (25 mg total) by mouth 3 (three) times daily.   levothyroxine 100 MCG tablet Commonly known as:  SYNTHROID, LEVOTHROID Take 100 mcg by mouth daily before breakfast.   magnesium oxide 400 MG tablet Commonly known as:  MAG-OX Take 400 mg by mouth daily.   Melatonin 3 MG Tabs Take by mouth. 2 by mouth at bedtime   pantoprazole 40 MG tablet Commonly known as:  PROTONIX Take 1 tablet (40 mg total) by mouth daily.   polyethylene glycol packet Commonly known as:  MIRALAX / GLYCOLAX Take  17 g by mouth daily.   senna-docusate 8.6-50 MG tablet Commonly known as:  Senokot-S Take 1-2 tablets, IF NEEDED, every 12 hrs for constipation   traMADol 50 MG tablet Commonly known as:  ULTRAM Take one tablet by mouth three times daily as needed for pain level 4 or higher   triamcinolone 55 MCG/ACT Aero nasal inhaler Commonly known as:  NASACORT AQ Place 2 sprays into the nose daily.   vitamin B-12 1000 MCG tablet Commonly known as:  CYANOCOBALAMIN Take 1,000 mcg by mouth daily.       Allergies:  Allergies  Allergen Reactions  . Allegra Allergy [Fexofenadine Hcl] Anaphylaxis  . Amoxicillin   . Penicillins   . Sulfa Antibiotics     Past Medical History, Surgical history, Social history,  and Family History were reviewed and updated.  Review of Systems: All other 10 point review of systems is negative.   Physical Exam:  weight is 121 lb (54.9 kg). Her oral temperature is 98.1 F (36.7 C). Her blood pressure is 147/54 (abnormal) and her pulse is 72. Her respiration is 18 and oxygen saturation is 99%.   Wt Readings from Last 3 Encounters:  02/26/16 121 lb (54.9 kg)  10/10/15 124 lb (56.2 kg)  06/06/15 120 lb (54.4 kg)    Ocular: Sclerae unicteric, pupils equal, round and reactive to light Ear-nose-throat: Oropharynx clear, dentition fair Lymphatic: No cervical, supraclavicular or axillary adenopathy Lungs no rales or rhonchi, good excursion bilaterally Heart regular rate and rhythm, no murmur appreciated Abd soft, nontender, positive bowel sounds, no liver or spleen tip palpated on exam, no fluid wave MSK no focal spinal tenderness, no joint edema Neuro: non-focal, well-oriented, appropriate affect Breasts: Deferred  Lab Results  Component Value Date   WBC 2.9 (L) 02/26/2016   HGB 10.5 (L) 02/26/2016   HCT 33.5 (L) 02/26/2016   MCV 96 02/26/2016   PLT 272 02/26/2016   Lab Results  Component Value Date   FERRITIN 105 10/10/2015   IRON 72 10/10/2015   TIBC 241 10/10/2015   UIBC 170 10/10/2015   IRONPCTSAT 30 10/10/2015   Lab Results  Component Value Date   RETICCTPCT 1.7 12/13/2014   RBC 3.50 (L) 02/26/2016   RETICCTABS 60.5 12/13/2014   No results found for: KPAFRELGTCHN, LAMBDASER, KAPLAMBRATIO No results found for: IGGSERUM, IGA, IGMSERUM No results found for: Odetta Pink, SPEI   Chemistry      Component Value Date/Time   NA 136 11/22/2014 1159   NA 137 10/04/2014 1333   K 4.0 11/22/2014 1159   K 4.5 10/04/2014 1333   CL 101 11/22/2014 1159   CL 106 10/04/2014 1333   CO2 28 11/22/2014 1159   CO2 25 10/04/2014 1333   BUN 21 11/22/2014 1159   BUN 25 (H) 10/04/2014 1333   CREATININE 1.68  (H) 11/22/2014 1159   CREATININE 1.8 (H) 10/04/2014 1333      Component Value Date/Time   CALCIUM 11.3 (H) 11/22/2014 1159   CALCIUM 10.8 (H) 10/04/2014 1333   ALKPHOS 70 11/22/2014 1159   ALKPHOS 59 10/04/2014 1333   AST 15 11/22/2014 1159   AST 19 10/04/2014 1333   ALT 8 11/22/2014 1159   ALT 10 10/04/2014 1333   BILITOT 0.4 11/22/2014 1159   BILITOT 0.40 10/04/2014 1333     Impression and Plan: Ms. Zierden is a 83 yo white female with multifactorial anemia (iron deficiency and anemia of chronic renal insufficiency). She has responded  nicely to Aranesp. Her Hgb is stable at 10.5 and she will not need an injection at this time.  We will see what her iron studies show and bring her back in next week for an infusion if needed.  We will plan to see her back in 4 months for labs and follow-up.  Both she and her daughter know to contact us with any questions or concerns. We can certainly see her sooner if need be.   Eliezer Bottom, NP 1/25/20183:59 PM

## 2016-02-27 ENCOUNTER — Telehealth: Payer: Self-pay | Admitting: *Deleted

## 2016-02-27 LAB — IRON AND TIBC
%SAT: 26 % (ref 21–57)
Iron: 60 ug/dL (ref 41–142)
TIBC: 229 ug/dL — AB (ref 236–444)
UIBC: 169 ug/dL (ref 120–384)

## 2016-02-27 LAB — FERRITIN: Ferritin: 121 ng/ml (ref 9–269)

## 2016-02-27 NOTE — Telephone Encounter (Addendum)
Patient's daughter aware of results  ----- Message from Eliezer Bottom, NP sent at 02/27/2016 11:25 AM EST ----- Regarding: iron  Iron studies look good no infusion needed at this time. Thank you!  Sarah  ----- Message ----- From: Interface, Lab In Three Zero One Sent: 02/26/2016   2:17 PM To: Eliezer Bottom, NP

## 2016-03-06 ENCOUNTER — Encounter: Payer: Self-pay | Admitting: Internal Medicine

## 2016-03-30 ENCOUNTER — Other Ambulatory Visit (INDEPENDENT_AMBULATORY_CARE_PROVIDER_SITE_OTHER): Payer: Medicare Other

## 2016-03-30 ENCOUNTER — Ambulatory Visit (INDEPENDENT_AMBULATORY_CARE_PROVIDER_SITE_OTHER): Payer: Medicare Other | Admitting: Internal Medicine

## 2016-03-30 ENCOUNTER — Encounter: Payer: Self-pay | Admitting: Internal Medicine

## 2016-03-30 VITALS — BP 146/82 | HR 72 | Temp 97.6°F | Ht 62.0 in | Wt 120.0 lb

## 2016-03-30 DIAGNOSIS — E034 Atrophy of thyroid (acquired): Secondary | ICD-10-CM

## 2016-03-30 DIAGNOSIS — E119 Type 2 diabetes mellitus without complications: Secondary | ICD-10-CM

## 2016-03-30 DIAGNOSIS — E2839 Other primary ovarian failure: Secondary | ICD-10-CM

## 2016-03-30 DIAGNOSIS — M7989 Other specified soft tissue disorders: Secondary | ICD-10-CM | POA: Diagnosis not present

## 2016-03-30 DIAGNOSIS — E785 Hyperlipidemia, unspecified: Secondary | ICD-10-CM | POA: Diagnosis not present

## 2016-03-30 DIAGNOSIS — D61818 Other pancytopenia: Secondary | ICD-10-CM

## 2016-03-30 DIAGNOSIS — M25511 Pain in right shoulder: Secondary | ICD-10-CM

## 2016-03-30 DIAGNOSIS — R32 Unspecified urinary incontinence: Secondary | ICD-10-CM

## 2016-03-30 DIAGNOSIS — R1310 Dysphagia, unspecified: Secondary | ICD-10-CM

## 2016-03-30 DIAGNOSIS — G8929 Other chronic pain: Secondary | ICD-10-CM | POA: Insufficient documentation

## 2016-03-30 DIAGNOSIS — M25512 Pain in left shoulder: Secondary | ICD-10-CM | POA: Diagnosis not present

## 2016-03-30 DIAGNOSIS — D500 Iron deficiency anemia secondary to blood loss (chronic): Secondary | ICD-10-CM

## 2016-03-30 LAB — CBC WITH DIFFERENTIAL/PLATELET
BASOS PCT: 1.3 % (ref 0.0–3.0)
Basophils Absolute: 0 10*3/uL (ref 0.0–0.1)
EOS PCT: 0.8 % (ref 0.0–5.0)
Eosinophils Absolute: 0 10*3/uL (ref 0.0–0.7)
HCT: 33.3 % — ABNORMAL LOW (ref 36.0–46.0)
Hemoglobin: 10.9 g/dL — ABNORMAL LOW (ref 12.0–15.0)
LYMPHS ABS: 1.3 10*3/uL (ref 0.7–4.0)
Lymphocytes Relative: 43.2 % (ref 12.0–46.0)
MCHC: 32.8 g/dL (ref 30.0–36.0)
MCV: 91.9 fl (ref 78.0–100.0)
MONO ABS: 0.3 10*3/uL (ref 0.1–1.0)
Monocytes Relative: 9.5 % (ref 3.0–12.0)
NEUTROS ABS: 1.4 10*3/uL (ref 1.4–7.7)
NEUTROS PCT: 45.2 % (ref 43.0–77.0)
Platelets: 312 10*3/uL (ref 150.0–400.0)
RBC: 3.62 Mil/uL — ABNORMAL LOW (ref 3.87–5.11)
RDW: 14.5 % (ref 11.5–15.5)
WBC: 3.1 10*3/uL — ABNORMAL LOW (ref 4.0–10.5)

## 2016-03-30 LAB — HEPATIC FUNCTION PANEL
ALK PHOS: 72 U/L (ref 39–117)
ALT: 8 U/L (ref 0–35)
AST: 13 U/L (ref 0–37)
Albumin: 4.1 g/dL (ref 3.5–5.2)
BILIRUBIN TOTAL: 0.2 mg/dL (ref 0.2–1.2)
Bilirubin, Direct: 0.1 mg/dL (ref 0.0–0.3)
Total Protein: 7.4 g/dL (ref 6.0–8.3)

## 2016-03-30 LAB — BASIC METABOLIC PANEL
BUN: 23 mg/dL (ref 6–23)
CHLORIDE: 104 meq/L (ref 96–112)
CO2: 27 mEq/L (ref 19–32)
Calcium: 10.6 mg/dL — ABNORMAL HIGH (ref 8.4–10.5)
Creatinine, Ser: 1.51 mg/dL — ABNORMAL HIGH (ref 0.40–1.20)
GFR: 34.27 mL/min — AB (ref >60.00)
Glucose, Bld: 105 mg/dL — ABNORMAL HIGH (ref 70–99)
POTASSIUM: 3.8 meq/L (ref 3.5–5.1)
SODIUM: 138 meq/L (ref 135–145)

## 2016-03-30 LAB — LDL CHOLESTEROL, DIRECT: Direct LDL: 60 mg/dL

## 2016-03-30 LAB — LIPID PANEL
CHOL/HDL RATIO: 4
Cholesterol: 142 mg/dL (ref 0–200)
HDL: 36.7 mg/dL — ABNORMAL LOW (ref >39.00)
NonHDL: 105.35
Triglycerides: 248 mg/dL — ABNORMAL HIGH (ref 0.0–149.0)
VLDL: 49.6 mg/dL — AB (ref 0.0–40.0)

## 2016-03-30 LAB — TSH: TSH: 2.35 u[IU]/mL (ref 0.35–4.50)

## 2016-03-30 LAB — HEMOGLOBIN A1C: HEMOGLOBIN A1C: 6.6 % — AB (ref 4.6–6.5)

## 2016-03-30 LAB — C-REACTIVE PROTEIN: CRP: 0.2 mg/dL — ABNORMAL LOW (ref 0.5–20.0)

## 2016-03-30 LAB — SEDIMENTATION RATE: SED RATE: 64 mm/h — AB (ref 0–30)

## 2016-03-30 MED ORDER — TRAMADOL HCL 50 MG PO TABS: ORAL_TABLET | ORAL | 1 refills | Status: DC

## 2016-03-30 MED ORDER — ESTROGENS, CONJUGATED 0.625 MG/GM VA CREA: 1.0000 | TOPICAL_CREAM | Freq: Every day | VAGINAL | 12 refills | Status: DC

## 2016-03-30 NOTE — Patient Instructions (Addendum)
Please take all new medication as prescribed - the cream  Please continue all other medications as before, and refills have been done if requested - the tramadol scheduled  Please have the pharmacy call with any other refills you may need.  Please continue your efforts at being more active, low cholesterol diet, and weight control.  You are otherwise up to date with prevention measures today.  Please keep your appointments with your specialists as you may have planned  Please schedule the bone density test before leaving today at the scheduling desk (where you check out).  Plesae make sure to see your eye doctor yearly and consider podiatry.   If you have Medicare related insurance (such as traditoinal Medicare, [employer] or [employer], or similar), Please make an appointment at the Scheduling desk with Sharee Pimple, the [employer], for your Wellness Visit in this office, which is a benefit with your insurance.   Please go to the LAB in the Basement (turn left off the elevator) for the tests to be done today  You will be contacted by phone if any changes need to be made immediately.  Otherwise, you will receive a letter about your results with an explanation, but please check with MyChart first.  Please remember to sign up for MyChart if you have not done so, as this will be important to you in the future with finding out test results, communicating by private email, and scheduling acute appointments online when needed.  Please return in 6 months, or sooner if needed

## 2016-03-30 NOTE — Progress Notes (Signed)
Subjective:    Patient ID: Allison Page, female    DOB: 1922-06-26, 83 y.o.   MRN: [id number]  HPI  Here for yuearlyf/u;  Overall doing ok;  Pt denies Chest pain, worsening SOB, DOE, wheezing, orthopnea, PND, palpitations, dizziness or syncope.  Pt denies neurological change such as new headache, facial or extremity weakness.  Pt denies polydipsia, polyuria, or low sugar symptoms. Pt states overall good compliance with treatment and medications, good tolerability, and has been trying to follow appropriate diet.  Pt denies worsening depressive symptoms, suicidal ideation or panic. No fever, night sweats, wt loss, loss of appetite, or other constitutional symptoms.  Pt states good ability with ADL's, has low fall risk, home safety reviewed and adequate, no other significant changes in hearing or vision, and only occasionally active with exercise.  Has swelling to RLE chronically for > 1 yr due to bakers cyst, and wearing the compressoin stocking but the swelling persists and suggested by ortho to bring up here.  Aldso with persistent worsening urinary incontinence despite current medications.  Trying to avoid urology for now.  Also has recent onset solid food dysphagia only (not liquids) with some regurgitation with food sticking it seems in the back of the throat. No aspiration.  Getting labs with Dr Marin Olp following anemia. Asks for scheduled tramadol instead of PRN for neck and chronic bilat shoudler pain followed by ortho and sport med,. Past Medical History:  Diagnosis Date  . Acid reflux   . Anemia of chronic renal failure, stage 3 (moderate) 03/21/2014  . Arthritis   . Dementia 11/19/2014  . Diabetes ([place])   . High blood pressure   . High cholesterol   . Hypothyroidism   . Iron deficiency anemia 03/21/2014  . TIA (transient ischemic attack)    Past Surgical History:  Procedure Laterality Date  . ANTERIOR (CYSTOCELE) AND POSTERIOR REPAIR (RECTOCELE) WITH XENFORM GRAFT AND SACROSPINOUS  FIXATION    . BLADDER SUSPENSION    . BREAST BIOPSY    . CATARACT EXTRACTION    . CHOLECYSTECTOMY  2013  . COLONOSCOPY  05/02/2007  . ERCP  2014  . TOTAL HIP ARTHROPLASTY Left 05/06/2008  . VAGINAL HYSTERECTOMY      reports that she quit smoking about 46 years ago. Her smoking use included Cigarettes. She has a 25.00 pack-year smoking history. She has never used smokeless tobacco. She reports that she does not drink alcohol or use drugs. family history includes Arthritis in her mother; Cancer in her sister; Colon cancer in her son; Esophageal cancer in her daughter; Heart attack in her father; Seizures in her daughter. Allergies  Allergen Reactions  . Allegra Allergy [Fexofenadine Hcl] Anaphylaxis  . Amoxicillin   . Penicillins   . Sulfa Antibiotics    Current Outpatient Prescriptions on File Prior to Visit  Medication Sig Dispense Refill  . acetaminophen (TYLENOL) 500 MG tablet Take 1 tablet (500 mg total) by mouth every 6 (six) hours as needed. 270 tablet 2  . aluminum-magnesium hydroxide-simethicone (MAALOX) [id number] MG/5ML SUSP 2 teaspoons by mouth 3 times daily as needed for heartburn    . amLODipine (NORVASC) 5 MG tablet Take 5 mg by mouth daily.    Marland Kitchen aspirin 81 MG tablet Take 81 mg by mouth daily.    Marland Kitchen atorvastatin (LIPITOR) 20 MG tablet Take 20 mg by mouth daily at 6 PM.    . clindamycin (CLEOCIN) 150 MG capsule 4 by mouth 1 hour prior to dental procedures 4 capsule 0  .  darifenacin (ENABLEX) 7.5 MG 24 hr tablet Take 1 tablet (7.5 mg total) by mouth daily. 90 tablet 3  . donepezil (ARICEPT) 10 MG tablet Take 1/2 tablet daily for 2 weeks, then increase to 1 tablet daily 30 tablet 11  . fexofenadine (ALLEGRA) 180 MG tablet Take 1 tablet (180 mg total) by mouth daily. 30 tablet 11  . gabapentin (NEURONTIN) 100 MG capsule Take 1 capsule (100 mg total) by mouth 3 (three) times daily. 90 capsule 5  . guaiFENesin (MUCINEX) 600 MG 12 hr tablet Take 1 tablet (600 mg total) by mouth 2  (two) times daily as needed for cough or to loosen phlegm. 60 tablet 1  . hydrALAZINE (APRESOLINE) 25 MG tablet Take 1 tablet (25 mg total) by mouth 3 (three) times daily. 90 tablet 11  . levothyroxine (SYNTHROID, LEVOTHROID) 100 MCG tablet Take 100 mcg by mouth daily before breakfast.    . magnesium oxide (MAG-OX) 400 MG tablet Take 400 mg by mouth daily.    . Melatonin 3 MG TABS Take by mouth. 2 by mouth at bedtime    . pantoprazole (PROTONIX) 40 MG tablet Take 1 tablet (40 mg total) by mouth daily. 90 tablet 3  . polyethylene glycol (MIRALAX / GLYCOLAX) packet Take 17 g by mouth daily.    Marland Kitchen senna-docusate (SENOKOT-S) 8.6-50 MG per tablet Take 1-2 tablets, IF NEEDED, every 12 hrs for constipation 60 tablet 4  . triamcinolone (NASACORT AQ) 55 MCG/ACT AERO nasal inhaler Place 2 sprays into the nose daily. 1 Inhaler 12  . vitamin B-12 (CYANOCOBALAMIN) 1000 MCG tablet Take 1,000 mcg by mouth daily.     No current facility-administered medications on file prior to visit.    Review of Systems  Constitutional: Negative for unusual diaphoresis or night sweats HENT: Negative for ear swelling or discharge Eyes: Negative for worsening visual haziness  Respiratory: Negative for choking and stridor.   Gastrointestinal: Negative for distension or worsening eructation Genitourinary: Negative for retention or change in urine volume.  Musculoskeletal: Negative for other MSK pain or swelling Skin: Negative for color change and worsening wound Neurological: Negative for tremors and numbness other than noted  Psychiatric/Behavioral: Negative for decreased concentration or agitation other than above   All other system neg per pt    Objective:   Physical Exam BP (!) 146/82   Pulse 72   Temp 97.6 F (36.4 C)   Ht 5\' 2"  (1.575 m)   Wt 120 lb (54.4 kg)   SpO2 97%   BMI 21.95 kg/m  VS noted,  Constitutional: Pt appears in no apparent distress HENT: Head: NCAT.  Right Ear: External ear normal.  Left  Ear: External ear normal.  Eyes: . Pupils are equal, round, and reactive to light. Conjunctivae and EOM are normal Neck: Normal range of motion. Neck supple.  Cardiovascular: Normal rate and regular rhythm.   Pulmonary/Chest: Effort normal and breath sounds without rales or wheezing.  Abd:  Soft, NT, ND, + BS Neurological: Pt is alert. Not confused , motor grossly intact Skin: Skin is warm. No rash, chronic RLE edema noted, wearing bilat compression stockings today Psychiatric: Pt behavior is normal. No agitation.  Bilat shoudlers with reduced ROM, tender to palpate subacromial areas  Transthoracic Echocardiography Study Date: 07/22/2014 Impressions:  - Normal biventricular size and systolic function.   Abnormal relaxation with normal filling pressures.   No significant valvular abnormalities.    Assessment & Plan:

## 2016-03-31 NOTE — Assessment & Plan Note (Addendum)
Appears to be chronic venous type I suspect may be post DVT or venous insufficiency or right knee djd related, now chronic persistent, has no LLE edema, 2016 echo with normal EF, I rec'd cont'd compression stocking use though she does not like this  Note:  Total time for pt hx, exam, review of record with pt in the room, determination of diagnoses and plan for further eval and tx is > 40 min, with over 50% spent in coordination and counseling of patient

## 2016-03-31 NOTE — Assessment & Plan Note (Signed)
Ok for change tramadol 50 tid prn to scheduled, orders given to facility

## 2016-03-31 NOTE — Assessment & Plan Note (Signed)
Lab Results  Component Value Date   TSH 2.35 03/30/2016   stable overall by history and exam, recent data reviewed with pt, and pt to continue medical treatment as before,  to f/u any worsening symptoms or concerns

## 2016-03-31 NOTE — Assessment & Plan Note (Addendum)
Etiology unclear, recent onset, has hx of egd with dilation in past, will refer GI with Dr Henrene Pastor per daughter request, llikely needs f/u egd r/o malignancy

## 2016-03-31 NOTE — Assessment & Plan Note (Signed)
stable overall by history and exam, recent data reviewed with pt, and pt to continue medical treatment as before,  to f/u any worsening symptoms or concerns Lab Results  Component Value Date   LDLCALC 72 03/29/2014

## 2016-03-31 NOTE — Assessment & Plan Note (Signed)
stable overall by history and exam, recent data reviewed with pt, and pt to continue medical treatment as before,  to f/u any worsening symptoms or concerns Lab Results  Component Value Date   HGBA1C 6.6 (H) 03/30/2016    

## 2016-03-31 NOTE — Assessment & Plan Note (Signed)
Pt wanting to avoid urology for now if possible, to cont current tx but also add premarin cream asd,  to f/u any worsening symptoms or concerns

## 2016-03-31 NOTE — Assessment & Plan Note (Signed)
Suspect bilat impingement syndromes, also for esr and crp to assess for PMR, pt plans to f/u with Dr Smith/sport med

## 2016-04-02 ENCOUNTER — Encounter: Payer: Self-pay | Admitting: Internal Medicine

## 2016-04-05 NOTE — Telephone Encounter (Signed)
Corky Downs to see above please

## 2016-04-07 ENCOUNTER — Other Ambulatory Visit: Payer: Self-pay | Admitting: Internal Medicine

## 2016-04-09 ENCOUNTER — Other Ambulatory Visit: Payer: Medicare Other

## 2016-04-09 ENCOUNTER — Encounter: Payer: Self-pay | Admitting: Physician Assistant

## 2016-04-09 ENCOUNTER — Ambulatory Visit (INDEPENDENT_AMBULATORY_CARE_PROVIDER_SITE_OTHER): Payer: Medicare Other | Admitting: Physician Assistant

## 2016-04-09 VITALS — BP 142/74 | HR 70 | Ht 62.0 in | Wt 122.4 lb

## 2016-04-09 DIAGNOSIS — R1310 Dysphagia, unspecified: Secondary | ICD-10-CM

## 2016-04-09 DIAGNOSIS — D508 Other iron deficiency anemias: Secondary | ICD-10-CM

## 2016-04-09 DIAGNOSIS — K219 Gastro-esophageal reflux disease without esophagitis: Secondary | ICD-10-CM

## 2016-04-09 DIAGNOSIS — N183 Chronic kidney disease, stage 3 unspecified: Secondary | ICD-10-CM

## 2016-04-09 DIAGNOSIS — D631 Anemia in chronic kidney disease: Secondary | ICD-10-CM

## 2016-04-09 MED ORDER — PANTOPRAZOLE SODIUM 40 MG PO TBEC: 40.0000 mg | DELAYED_RELEASE_TABLET | Freq: Two times a day (BID) | ORAL | 3 refills | Status: DC

## 2016-04-09 NOTE — Patient Instructions (Addendum)
If you are age 81 or older, your body mass index should be between 23-30. Your Body mass index is 22.39 kg/m. If this is out of the aforementioned range listed, please consider follow up with your Primary Care Provider.  If you are age 35 or younger, your body mass index should be between 19-25. Your Body mass index is 22.39 kg/m. If this is out of the aformentioned range listed, please consider follow up with your Primary Care Provider.   We have sent the following medications to your pharmacy for you to pick up at your convenience:  Pantoprazole  You have been scheduled for a Barium Esophogram at [facility] tower, main entrance) on Thursday, March 15th at 10:00am. Please arrive 15 minutes prior to your appointment for registration. Make certain not to have anything to eat or drink 4 hours prior to your test. If you need to reschedule for any reason, please contact radiology at [phone] to do so. __________________________________________________________________ A barium swallow is an examination that concentrates on views of the esophagus. This tends to be a double contrast exam (barium and two liquids which, when combined, create a gas to distend the wall of the oesophagus) or single contrast (non-ionic iodine based). The study is usually tailored to your symptoms so a good history is essential. Attention is paid during the study to the form, structure and configuration of the esophagus, looking for functional disorders (such as aspiration, dysphagia, achalasia, motility and reflux) EXAMINATION You may be asked to change into a gown, depending on the type of swallow being performed. A radiologist and radiographer will perform the procedure. The radiologist will advise you of the type of contrast selected for your procedure and direct you during the exam. You will be asked to stand, sit or lie in several different positions and to hold a small amount of fluid in your mouth  before being asked to swallow while the imaging is performed .In some instances you may be asked to swallow barium coated marshmallows to assess the motility of a solid food bolus. The exam can be recorded as a digital or video fluoroscopy procedure. POST PROCEDURE It will take 1-2 days for the barium to pass through your system. To facilitate this, it is important, unless otherwise directed, to increase your fluids for the next 24-48hrs and to resume your normal diet.  This test typically takes about 30 minutes to perform. __________________________________________________________________________________  Thank you.

## 2016-04-09 NOTE — Progress Notes (Signed)
Chief Complaint: Dysphagia  HPI:  Allison Page is a 83 year old Caucasian female with a past medical history of reflux, dementia, diabetes, iron deficiency anemia and TIA,  who was referred to me by Biagio Borg, MD for a complaint of dysphagia .     Patient has followed with Dr. Ardis Hughs previously for a similar complaint 09/26/14. At that time she had just started Allegra which Dr. Ardis Hughs thought was giving her a dry mouth and a globus sensation. She was told to discontinue this.   Today, the patient presents to clinic accompanied by her daughter who does assist with her history greatly as the patient has dementia. The patient tells me that for at least 2-3 years she has had trouble with her pills getting stuck on the way down when she swallows. She tells me that recently they've been crushing her pills in applesauce and this has helped this symptom. Over the past month or so she has noted that solids, typically meats are getting stuck in her throat when eating and she will often regurgitate them with whatever liquid she drinks afterwards after eating them immediately. Patient tells me that it feels as though they "go down on the right side and get stuck". Patient tells me she is somewhat embarrassed about having to "spit up my foods at the table", and would like to figure out what is going on.   Patient also complains of chronic indigestion. She is on Pantoprazole 40 mg daily but this does not seem to be helping anymore as she has to chew Tums constantly throughout the day.   Patient denies fever, chills, blood in her stool, melena, weight loss, fatigue, anorexia and nausea, vomiting, abdominal pain or symptoms that awaken her at night.  Past Medical History:  Diagnosis Date  . Acid reflux   . Anemia of chronic renal failure, stage 3 (moderate) 03/21/2014  . Arthritis   . Dementia 11/19/2014  . Diabetes ([place])   . High blood pressure   . High cholesterol   . Hypothyroidism   . Iron deficiency  anemia 03/21/2014  . TIA (transient ischemic attack)     Past Surgical History:  Procedure Laterality Date  . ANTERIOR (CYSTOCELE) AND POSTERIOR REPAIR (RECTOCELE) WITH XENFORM GRAFT AND SACROSPINOUS FIXATION    . BLADDER SUSPENSION    . BREAST BIOPSY    . CATARACT EXTRACTION    . CHOLECYSTECTOMY  2013  . COLONOSCOPY  05/02/2007  . ERCP  2014  . TOTAL HIP ARTHROPLASTY Left 05/06/2008  . VAGINAL HYSTERECTOMY      Current Outpatient Prescriptions  Medication Sig Dispense Refill  . acetaminophen (TYLENOL) 500 MG tablet Take 1 tablet (500 mg total) by mouth every 6 (six) hours as needed. 270 tablet 2  . aluminum-magnesium hydroxide-simethicone (MAALOX) 761-950-93 MG/5ML SUSP 2 teaspoons by mouth 3 times daily as needed for heartburn    . amLODipine (NORVASC) 5 MG tablet Take 5 mg by mouth daily.    Marland Kitchen aspirin 81 MG tablet Take 81 mg by mouth daily.    Marland Kitchen atorvastatin (LIPITOR) 20 MG tablet Take 20 mg by mouth daily at 6 PM.    . clindamycin (CLEOCIN) 150 MG capsule 4 by mouth 1 hour prior to dental procedures 4 capsule 0  . conjugated estrogens (PREMARIN) vaginal cream Place 1 Applicatorful vaginally daily. 42.5 g 12  . darifenacin (ENABLEX) 7.5 MG 24 hr tablet Take 1 tablet (7.5 mg total) by mouth daily. 90 tablet 3  . donepezil (ARICEPT) 10  MG tablet Take 1/2 tablet daily for 2 weeks, then increase to 1 tablet daily 30 tablet 11  . fexofenadine (ALLEGRA) 180 MG tablet Take 1 tablet (180 mg total) by mouth daily. 30 tablet 11  . gabapentin (NEURONTIN) 100 MG capsule Take 1 capsule (100 mg total) by mouth 3 (three) times daily. 90 capsule 5  . guaiFENesin (MUCINEX) 600 MG 12 hr tablet Take 1 tablet (600 mg total) by mouth 2 (two) times daily as needed for cough or to loosen phlegm. 60 tablet 1  . hydrALAZINE (APRESOLINE) 25 MG tablet Take 1 tablet (25 mg total) by mouth 3 (three) times daily. 90 tablet 11  . levothyroxine (SYNTHROID, LEVOTHROID) 100 MCG tablet Take 100 mcg by mouth daily before  breakfast.    . magnesium oxide (MAG-OX) 400 MG tablet Take 400 mg by mouth daily.    . Melatonin 3 MG TABS Take by mouth. 2 by mouth at bedtime    . pantoprazole (PROTONIX) 40 MG tablet Take 1 tablet (40 mg total) by mouth daily. 90 tablet 3  . polyethylene glycol (MIRALAX / GLYCOLAX) packet Take 17 g by mouth daily.    Marland Kitchen senna-docusate (SENOKOT-S) 8.6-50 MG per tablet Take 1-2 tablets, IF NEEDED, every 12 hrs for constipation 60 tablet 4  . traMADol (ULTRAM) 50 MG tablet Take one tablet by mouth three times daily scheduled 270 tablet 1  . triamcinolone (NASACORT AQ) 55 MCG/ACT AERO nasal inhaler Place 2 sprays into the nose daily. 1 Inhaler 12  . vitamin B-12 (CYANOCOBALAMIN) 1000 MCG tablet Take 1,000 mcg by mouth daily.     No current facility-administered medications for this visit.     Allergies as of 04/09/2016 - Review Complete 03/30/2016  Allergen Reaction Noted  . Allegra allergy [fexofenadine hcl] Anaphylaxis 10/04/2014  . Amoxicillin  03/22/2014  . Penicillins  03/04/2014  . Sulfa antibiotics  03/04/2014    Family History  Problem Relation Age of Onset  . Heart attack Father   . Arthritis Mother   . Cancer Sister   . Esophageal cancer Daughter   . Seizures Daughter     As a teenager  . Colon cancer Son     Social History   Social History  . Marital status: Unknown    Spouse name: N/A  . Number of children: 3  . Years of education: N/A   Occupational History  . retired    Social History Main Topics  . Smoking status: Former Smoker    Packs/day: 1.00    Years: 25.00    Types: Cigarettes    Quit date: 04/11/1970  . Smokeless tobacco: Never Used     Comment: quit 42 years ago  . Alcohol use No  . Drug use: No  . Sexual activity: Not on file   Other Topics Concern  . Not on file   Social History Narrative   Diet: Diabetic diet   Caffeine:no    Married: widowed   House: Assisted Living   Pets: No   Current/Past profession: Housewife   Exercise: No    Living Will: Yes   DNR: Yes   POA/HPOA: N/A          Review of Systems:    Constitutional: No weight loss, fever or chills Skin: No rash  Cardiovascular: No chest pain Respiratory: No SOB  Gastrointestinal: See HPI and otherwise negative Genitourinary: No dysuria  Neurological: No headache, dizziness or syncope Musculoskeletal: No new muscle or joint pain Hematologic: No bleeding  Psychiatric: No history of depression or anxiety   Physical Exam:  Vital signs: BP (!) 142/74   Pulse 70   Ht 5\' 2"  (1.575 m)   Wt 122 lb 6.4 oz (55.5 kg)   SpO2 97%   BMI 22.39 kg/m   Constitutional:   Pleasant Elderly Caucasian female appears to be in NAD, Well developed, Well nourished, alert and cooperative Head:  Normocephalic and atraumatic. Eyes:   PEERL, EOMI. No icterus. Conjunctiva pink. Ears:  Normal auditory acuity. Neck:  Supple Throat: Oral cavity and pharynx without inflammation, swelling or lesion.  Respiratory: Respirations even and unlabored. Lungs clear to auscultation bilaterally.   No wheezes, crackles, or rhonchi.  Cardiovascular: Normal S1, S2. No MRG. Regular rate and rhythm. No peripheral edema, cyanosis or pallor.  Gastrointestinal:  Soft, nondistended, nontender. No rebound or guarding. Normal bowel sounds. No appreciable masses or hepatomegaly. Rectal:  Not performed.  Msk:  Symmetrical without gross deformities. Without edema, no deformity or joint abnormality.  Neurologic:  Alert and  oriented x4;  grossly normal neurologically.  Skin:   Dry and intact without significant lesions or rashes. Psychiatric: Dementia  MOST RECENT LABS: CBC    Component Value Date/Time   WBC 3.1 (L) 03/30/2016 1612   RBC 3.62 (L) 03/30/2016 1612   HGB 10.9 (L) 03/30/2016 1612   HGB 10.5 (L) 02/26/2016 1340   HCT 33.3 (L) 03/30/2016 1612   HCT 33.5 (L) 02/26/2016 1340   PLT 312.0 03/30/2016 1612   PLT 272 02/26/2016 1340   PLT 278 07/05/2014 1622   MCV 91.9 03/30/2016 1612     MCV 96 02/26/2016 1340   MCH 30.0 02/26/2016 1340   MCHC 32.8 03/30/2016 1612   RDW 14.5 03/30/2016 1612   RDW 14.1 02/26/2016 1340   LYMPHSABS 1.3 03/30/2016 1612   LYMPHSABS 0.8 07/05/2014 1622   MONOABS 0.3 03/30/2016 1612   EOSABS 0.0 03/30/2016 1612   EOSABS 0.1 07/05/2014 1622   BASOSABS 0.0 03/30/2016 1612   BASOSABS 0.0 07/05/2014 1622    CMP     Component Value Date/Time   NA 138 03/30/2016 1612   NA 137 10/04/2014 1333   K 3.8 03/30/2016 1612   K 4.5 10/04/2014 1333   CL 104 03/30/2016 1612   CL 106 10/04/2014 1333   CO2 27 03/30/2016 1612   CO2 25 10/04/2014 1333   GLUCOSE 105 (H) 03/30/2016 1612   GLUCOSE 122 (H) 10/04/2014 1333   BUN 23 03/30/2016 1612   BUN 25 (H) 10/04/2014 1333   CREATININE 1.51 (H) 03/30/2016 1612   CREATININE 1.8 (H) 10/04/2014 1333   CALCIUM 10.6 (H) 03/30/2016 1612   CALCIUM 10.8 (H) 10/04/2014 1333   PROT 7.4 03/30/2016 1612   PROT 7.2 10/04/2014 1333   ALBUMIN 4.1 03/30/2016 1612   ALBUMIN 3.5 10/04/2014 1333   AST 13 03/30/2016 1612   AST 19 10/04/2014 1333   ALT 8 03/30/2016 1612   ALT 10 10/04/2014 1333   ALKPHOS 72 03/30/2016 1612   ALKPHOS 59 10/04/2014 1333   BILITOT 0.2 03/30/2016 1612   BILITOT 0.40 10/04/2014 1333   GFRNONAA 26 (L) 07/05/2014 1622   GFRAA 30 (L) 07/05/2014 1622    Assessment: 1. Dysphagia: For the past few years to pills, recently to meats when eating, patient describes regurgitation; consider structural abnormality such as stricture, ring, web, mass or motility disorder 2. GERD: Increase in indigestion lately regardless of Pantoprazole 40 mg daily  Plan: 1. Ordered a barium esophagram today  for further evaluation of dysphagia. If this does show stricture or structural abnormality would recommend an EGD. This would need to be scheduled with Dr. Ardis Hughs. 2. Reviewed anti-dysphagia measures including taking small bites, drink sips of water between bites, avoiding distraction while eating and the  chin tuck technique. Did discuss that the patient may want to completely avoid meats for now until we figure out what is going on. 3. Increased patient's Pantoprazole to 40 mg twice a day, 30-60 min. before eating breakfast and dinner. 4. Patient to follow in clinic per Dr. Cannon Kettle recommendations after imaging study above  Allison Newer, PA-C [facility] 04/09/2016, 1:51 PM  Cc: Biagio Borg, MD

## 2016-04-12 NOTE — Progress Notes (Signed)
I agree with the above note, plan 

## 2016-04-15 ENCOUNTER — Other Ambulatory Visit: Payer: Medicare Other

## 2016-04-15 ENCOUNTER — Other Ambulatory Visit: Payer: Self-pay

## 2016-04-15 ENCOUNTER — Ambulatory Visit (HOSPITAL_COMMUNITY)
Admission: RE | Admit: 2016-04-15 | Discharge: 2016-04-15 | Disposition: A | Discharge status: Home / Self Care | Payer: Medicare Other | Source: Ambulatory Visit | Attending: Physician Assistant | Admitting: Physician Assistant

## 2016-04-15 DIAGNOSIS — K449 Diaphragmatic hernia without obstruction or gangrene: Secondary | ICD-10-CM | POA: Diagnosis not present

## 2016-04-15 DIAGNOSIS — K224 Dyskinesia of esophagus: Secondary | ICD-10-CM | POA: Insufficient documentation

## 2016-04-15 DIAGNOSIS — R1310 Dysphagia, unspecified: Secondary | ICD-10-CM | POA: Diagnosis present

## 2016-04-15 DIAGNOSIS — D509 Iron deficiency anemia, unspecified: Secondary | ICD-10-CM

## 2016-04-15 DIAGNOSIS — K225 Diverticulum of esophagus, acquired: Secondary | ICD-10-CM | POA: Diagnosis not present

## 2016-04-16 LAB — PTH, INTACT AND CALCIUM
CALCIUM: 10.3 mg/dL (ref 8.6–10.4)
PTH: 290 pg/mL — AB (ref 14–64)

## 2016-04-17 ENCOUNTER — Other Ambulatory Visit: Payer: Self-pay | Admitting: Internal Medicine

## 2016-04-17 DIAGNOSIS — E213 Hyperparathyroidism, unspecified: Secondary | ICD-10-CM

## 2016-04-20 ENCOUNTER — Encounter: Payer: Self-pay | Admitting: Physician Assistant

## 2016-04-20 ENCOUNTER — Encounter: Payer: Self-pay | Admitting: Internal Medicine

## 2016-04-30 ENCOUNTER — Encounter: Payer: Self-pay | Admitting: Physician Assistant

## 2016-05-10 ENCOUNTER — Telehealth: Payer: Self-pay | Admitting: Gastroenterology

## 2016-05-10 NOTE — Telephone Encounter (Signed)
I spoke with her daughter about the syptomatic Zenkers, answered all her questions. She is going to discuss with her mother more, will get back to [place] if she wants to go ahead with referral.  For now, will probably try modifying her diet to more puree type consistency.   thanks

## 2016-06-08 ENCOUNTER — Encounter: Payer: Self-pay | Admitting: Neurology

## 2016-06-08 ENCOUNTER — Ambulatory Visit (INDEPENDENT_AMBULATORY_CARE_PROVIDER_SITE_OTHER): Payer: Medicare Other | Admitting: Neurology

## 2016-06-08 VITALS — BP 138/52 | HR 72 | Temp 98.0°F | Ht 62.0 in | Wt 120.0 lb

## 2016-06-08 DIAGNOSIS — F0390 Unspecified dementia without behavioral disturbance: Secondary | ICD-10-CM

## 2016-06-08 DIAGNOSIS — F03B0 Unspecified dementia, moderate, without behavioral disturbance, psychotic disturbance, mood disturbance, and anxiety: Secondary | ICD-10-CM

## 2016-06-08 NOTE — Patient Instructions (Signed)
It was great to see you! 1. Continue Aricept 10mg  daily 2. Control of blood pressure, cholesterol, diabetes, as well as physical exercise and brain stimulation exercises are important for brain health 3. Follow-up in 1 year, call for any changes
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
## 2016-06-14 ENCOUNTER — Encounter: Payer: Self-pay | Admitting: Neurology

## 2016-06-29 ENCOUNTER — Other Ambulatory Visit: Payer: Self-pay | Admitting: Internal Medicine

## 2016-06-29 DIAGNOSIS — D61818 Other pancytopenia: Secondary | ICD-10-CM

## 2016-06-29 DIAGNOSIS — D500 Iron deficiency anemia secondary to blood loss (chronic): Secondary | ICD-10-CM

## 2016-06-29 NOTE — Telephone Encounter (Signed)
Done hardcopy to [place]  

## 2016-06-30 NOTE — Telephone Encounter (Signed)
Faxed

## 2016-07-05 ENCOUNTER — Ambulatory Visit: Payer: Medicare Other | Admitting: Hematology & Oncology

## 2016-07-05 ENCOUNTER — Ambulatory Visit: Payer: Medicare Other

## 2016-07-05 ENCOUNTER — Other Ambulatory Visit: Payer: Medicare Other

## 2016-07-14 ENCOUNTER — Telehealth: Payer: Self-pay | Admitting: Internal Medicine

## 2016-07-14 ENCOUNTER — Ambulatory Visit (INDEPENDENT_AMBULATORY_CARE_PROVIDER_SITE_OTHER): Payer: Medicare Other | Admitting: Internal Medicine

## 2016-07-14 VITALS — BP 130/52 | HR 72 | Temp 99.3°F | Resp 16 | Wt 120.0 lb

## 2016-07-14 DIAGNOSIS — R05 Cough: Secondary | ICD-10-CM | POA: Diagnosis not present

## 2016-07-14 DIAGNOSIS — I10 Essential (primary) hypertension: Secondary | ICD-10-CM

## 2016-07-14 DIAGNOSIS — E119 Type 2 diabetes mellitus without complications: Secondary | ICD-10-CM

## 2016-07-14 DIAGNOSIS — R059 Cough, unspecified: Secondary | ICD-10-CM

## 2016-07-14 MED ORDER — LEVOFLOXACIN 250 MG PO TABS: 250.0000 mg | ORAL_TABLET | Freq: Every day | ORAL | 0 refills | Status: AC

## 2016-07-14 MED ORDER — HYDROCODONE-HOMATROPINE 5-1.5 MG/5ML PO SYRP: 5.0000 mL | ORAL_SOLUTION | Freq: Four times a day (QID) | ORAL | 0 refills | Status: AC | PRN

## 2016-07-14 NOTE — Telephone Encounter (Signed)
Notified daughter

## 2016-07-14 NOTE — Telephone Encounter (Signed)
Daughter called stating [facility] faxed a request for liquid Mucinex.  Stating patient can not take pills.  Daughter is requesting order to be faxed over as soon as possible.  Did advise daughter to inform [facility] if they need something more urgent to give [place] a call rather than fax.

## 2016-07-14 NOTE — Patient Instructions (Signed)
Please take all new medication as prescribed - the antibiotic, and cough medicine if needed, for possible right lower lung pneumonia  The order will be sent to your pharmacy, as well as the facility  Please continue all other medications as before, and refills have been done if requested.  Please have the pharmacy call with any other refills you may need.  Please keep your appointments with your specialists as you may have planned  Please consider returning tomorrow or soon after for a Chest Xray

## 2016-07-14 NOTE — Assessment & Plan Note (Signed)
stable overall by history and exam, recent data reviewed with pt, and pt to continue medical treatment as before,  to f/u any worsening symptoms or concerns Lab Results  Component Value Date   HGBA1C 6.6 (H) 03/30/2016

## 2016-07-14 NOTE — Telephone Encounter (Signed)
That was faxed back over this morning.

## 2016-07-14 NOTE — Assessment & Plan Note (Signed)
Mild to mod, c/w likely CAP, is PCN allergic, for antibx course - levaquin and cough med prn, for cxr if able to return in AM when xray is open, declines other cxr at hospital,  to f/u any worsening symptoms or concerns

## 2016-07-14 NOTE — Progress Notes (Signed)
Subjective:    Patient ID: Allison Page, female    DOB: Jun 09, 1922, 83 y.o.   MRN: [id number]  HPI  Here with acute onset mild to mod 6-7 days ST, HA, general weakness and malaise, with prod cough large amounts greenish sputum with mild sob, but Pt denies chest pain, wheezing, orthopnea, PND, increased LE swelling, palpitations, dizziness or syncope.  Was noted by provider at the facility to have basilar reduced BS and referred here.  Just cant stp coughing, feels terrible  Pt denies new neurological symptoms such as new headache, or facial or extremity weakness or numbness   Pt denies polydipsia, polyuria  Past Medical History:  Diagnosis Date  . Acid reflux   . Anemia of chronic renal failure, stage 3 (moderate) 03/21/2014  . Arthritis   . Dementia 11/19/2014  . Diabetes ([place])   . High blood pressure   . High cholesterol   . Hypothyroidism   . Iron deficiency anemia 03/21/2014  . TIA (transient ischemic attack)    Past Surgical History:  Procedure Laterality Date  . ANTERIOR (CYSTOCELE) AND POSTERIOR REPAIR (RECTOCELE) WITH XENFORM GRAFT AND SACROSPINOUS FIXATION    . BLADDER SUSPENSION    . BREAST BIOPSY    . CATARACT EXTRACTION    . CHOLECYSTECTOMY  2013  . COLONOSCOPY  05/02/2007  . ERCP  2014  . TOTAL HIP ARTHROPLASTY Left 05/06/2008  . VAGINAL HYSTERECTOMY      reports that she quit smoking about 46 years ago. Her smoking use included Cigarettes. She has a 25.00 pack-year smoking history. She has never used smokeless tobacco. She reports that she does not drink alcohol or use drugs. family history includes Arthritis in her mother; Cancer in her sister; Colon cancer in her son; Esophageal cancer in her daughter; Heart attack in her father; Seizures in her daughter. Allergies  Allergen Reactions  . Allegra Allergy [Fexofenadine Hcl] Anaphylaxis  . Amoxicillin   . Penicillins   . Sulfa Antibiotics    Current Outpatient Prescriptions on File Prior to Visit  Medication Sig  Dispense Refill  . acetaminophen (TYLENOL) 500 MG tablet Take 1 tablet (500 mg total) by mouth every 6 (six) hours as needed. 270 tablet 2  . aluminum-magnesium hydroxide-simethicone (MAALOX) 469-629-52 MG/5ML SUSP 2 teaspoons by mouth 3 times daily as needed for heartburn    . amLODipine (NORVASC) 5 MG tablet Take 5 mg by mouth daily.    Marland Kitchen aspirin 81 MG tablet Take 81 mg by mouth daily.    Marland Kitchen atorvastatin (LIPITOR) 20 MG tablet Take 20 mg by mouth daily at 6 PM.    . clindamycin (CLEOCIN) 150 MG capsule 4 by mouth 1 hour prior to dental procedures 4 capsule 0  . conjugated estrogens (PREMARIN) vaginal cream Place 1 Applicatorful vaginally daily. 42.5 g 12  . darifenacin (ENABLEX) 7.5 MG 24 hr tablet Take 1 tablet (7.5 mg total) by mouth daily. 90 tablet 3  . donepezil (ARICEPT) 10 MG tablet Take 1/2 tablet daily for 2 weeks, then increase to 1 tablet daily 30 tablet 11  . fexofenadine (ALLEGRA) 180 MG tablet Take 1 tablet (180 mg total) by mouth daily. 30 tablet 11  . gabapentin (NEURONTIN) 100 MG capsule Take 1 capsule (100 mg total) by mouth 3 (three) times daily. 90 capsule 5  . guaiFENesin (MUCINEX) 600 MG 12 hr tablet Take 1 tablet (600 mg total) by mouth 2 (two) times daily as needed for cough or to loosen phlegm. 60 tablet 1  .  hydrALAZINE (APRESOLINE) 25 MG tablet Take 1 tablet (25 mg total) by mouth 3 (three) times daily. 90 tablet 11  . levothyroxine (SYNTHROID, LEVOTHROID) 100 MCG tablet Take 100 mcg by mouth daily before breakfast.    . magnesium oxide (MAG-OX) 400 MG tablet Take 400 mg by mouth daily.    . Melatonin 3 MG TABS Take by mouth. 2 by mouth at bedtime    . pantoprazole (PROTONIX) 40 MG tablet Take 1 tablet (40 mg total) by mouth 2 (two) times daily. 90 tablet 3  . polyethylene glycol (MIRALAX / GLYCOLAX) packet Take 17 g by mouth daily.    Marland Kitchen senna-docusate (SENOKOT-S) 8.6-50 MG per tablet Take 1-2 tablets, IF NEEDED, every 12 hrs for constipation 60 tablet 4  . traMADol  (ULTRAM) 50 MG tablet TAKE (1) TABLET BY MOUTH (3) TIMES DAILY. 270 tablet 1  . triamcinolone (NASACORT AQ) 55 MCG/ACT AERO nasal inhaler Place 2 sprays into the nose daily. 1 Inhaler 12  . vitamin B-12 (CYANOCOBALAMIN) 1000 MCG tablet Take 1,000 mcg by mouth daily.     No current facility-administered medications on file prior to visit.    Review of Systems  Constitutional: Negative for other unusual diaphoresis or sweats HENT: Negative for ear discharge or swelling Eyes: Negative for other worsening visual disturbances Respiratory: Negative for stridor or other swelling  Gastrointestinal: Negative for worsening distension or other blood Genitourinary: Negative for retention or other urinary change Musculoskeletal: Negative for other MSK pain or swelling Skin: Negative for color change or other new lesions Neurological: Negative for worsening tremors and other numbness  Psychiatric/Behavioral: Negative for worsening agitation or other fatigue All other system neg per pt    Objective:   Physical Exam BP (!) 130/52   Pulse 72   Temp 99.3 F (37.4 C) (Oral)   Resp 16   Wt 120 lb (54.4 kg)   SpO2 91%   BMI 21.95 kg/m  VS noted, mod ill appaering, fatigued but able to get up on exam table with minor assist Constitutional: Pt appears in NAD HENT: Head: NCAT.  Right Ear: External ear normal.  Left Ear: External ear normal.  Eyes: . Pupils are equal, round, and reactive to light. Conjunctivae and EOM are normal Nose: without d/c or deformity Neck: Neck supple. Gross normal ROM Cardiovascular: Normal rate and regular rhythm.   Pulmonary/Chest: Effort normal and breath sounds decreased right base without frank rales, rhonchi or wheezing.  Neurological: Pt is alert. At baseline orientation, motor grossly intact Skin: Skin is warm. No rashes, other new lesions, no LE edema Psychiatric: Pt behavior is normal without agitation  No other exam findings    Assessment & Plan:

## 2016-07-14 NOTE — Assessment & Plan Note (Signed)
stable overall by history and exam, recent data reviewed with pt, and pt to continue medical treatment as before,  to f/u any worsening symptoms or concerns BP Readings from Last 3 Encounters:  07/14/16 (!) 130/52  06/08/16 (!) 138/52  04/09/16 (!) 142/74

## 2016-07-22 ENCOUNTER — Telehealth: Payer: Self-pay | Admitting: Hematology & Oncology

## 2016-07-22 ENCOUNTER — Other Ambulatory Visit: Payer: Self-pay | Admitting: Internal Medicine

## 2016-07-22 DIAGNOSIS — F0390 Unspecified dementia without behavioral disturbance: Secondary | ICD-10-CM

## 2016-07-22 DIAGNOSIS — F03B0 Unspecified dementia, moderate, without behavioral disturbance, psychotic disturbance, mood disturbance, and anxiety: Secondary | ICD-10-CM

## 2016-07-22 NOTE — Telephone Encounter (Signed)
Patient's daughter called and cx 07/26/16 apt and resch for 08/16/16

## 2016-07-26 ENCOUNTER — Ambulatory Visit: Payer: Medicare Other | Admitting: Hematology & Oncology

## 2016-07-26 ENCOUNTER — Other Ambulatory Visit: Payer: Medicare Other

## 2016-07-26 ENCOUNTER — Ambulatory Visit: Payer: Medicare Other

## 2016-07-28 ENCOUNTER — Other Ambulatory Visit: Payer: Self-pay | Admitting: Internal Medicine

## 2016-07-28 DIAGNOSIS — D500 Iron deficiency anemia secondary to blood loss (chronic): Secondary | ICD-10-CM

## 2016-07-28 DIAGNOSIS — D61818 Other pancytopenia: Secondary | ICD-10-CM

## 2016-08-02 ENCOUNTER — Other Ambulatory Visit: Payer: Self-pay | Admitting: Internal Medicine

## 2016-08-02 DIAGNOSIS — D500 Iron deficiency anemia secondary to blood loss (chronic): Secondary | ICD-10-CM

## 2016-08-02 DIAGNOSIS — D61818 Other pancytopenia: Secondary | ICD-10-CM

## 2016-08-10 ENCOUNTER — Other Ambulatory Visit: Payer: Self-pay | Admitting: Internal Medicine

## 2016-08-11 ENCOUNTER — Other Ambulatory Visit: Payer: Self-pay | Admitting: Internal Medicine

## 2016-08-12 ENCOUNTER — Telehealth: Payer: Self-pay

## 2016-08-12 ENCOUNTER — Other Ambulatory Visit: Payer: Self-pay | Admitting: Internal Medicine

## 2016-08-12 DIAGNOSIS — D500 Iron deficiency anemia secondary to blood loss (chronic): Secondary | ICD-10-CM

## 2016-08-12 DIAGNOSIS — D61818 Other pancytopenia: Secondary | ICD-10-CM

## 2016-08-12 MED ORDER — MELATONIN 3 MG PO TABS: 3.0000 mg | ORAL_TABLET | Freq: Every day | ORAL | 11 refills | Status: DC

## 2016-08-12 NOTE — Telephone Encounter (Signed)
Per pharmacist, melatonin 3mg  was sent in but the directions stated to take (2) 2mg  tablets. What should it say? Does a new corrected script need to be sent over? Please advise with how to proceed.

## 2016-08-12 NOTE — Telephone Encounter (Signed)
[place] for 1 tab by mouth at bedtime - done erx

## 2016-08-12 NOTE — Addendum Note (Signed)
Addended by: Biagio Borg on: 08/12/2016 11:58 AM   Modules accepted: Orders

## 2016-08-13 ENCOUNTER — Other Ambulatory Visit: Payer: Self-pay | Admitting: *Deleted

## 2016-08-13 DIAGNOSIS — D508 Other iron deficiency anemias: Secondary | ICD-10-CM

## 2016-08-16 ENCOUNTER — Ambulatory Visit (HOSPITAL_BASED_OUTPATIENT_CLINIC_OR_DEPARTMENT_OTHER): Payer: Medicare Other

## 2016-08-16 ENCOUNTER — Other Ambulatory Visit (HOSPITAL_BASED_OUTPATIENT_CLINIC_OR_DEPARTMENT_OTHER): Payer: Medicare Other

## 2016-08-16 ENCOUNTER — Other Ambulatory Visit: Payer: Self-pay | Admitting: Internal Medicine

## 2016-08-16 ENCOUNTER — Ambulatory Visit (HOSPITAL_BASED_OUTPATIENT_CLINIC_OR_DEPARTMENT_OTHER): Payer: Medicare Other | Admitting: Hematology & Oncology

## 2016-08-16 VITALS — BP 176/48 | HR 67 | Temp 97.8°F | Resp 20 | Wt 114.0 lb

## 2016-08-16 DIAGNOSIS — N183 Chronic kidney disease, stage 3 unspecified: Secondary | ICD-10-CM

## 2016-08-16 DIAGNOSIS — D631 Anemia in chronic kidney disease: Secondary | ICD-10-CM

## 2016-08-16 DIAGNOSIS — D508 Other iron deficiency anemias: Secondary | ICD-10-CM | POA: Diagnosis present

## 2016-08-16 DIAGNOSIS — D500 Iron deficiency anemia secondary to blood loss (chronic): Secondary | ICD-10-CM | POA: Diagnosis present

## 2016-08-16 LAB — MANUAL DIFFERENTIAL (CHCC SATELLITE)
ALC: 1 10*3/uL (ref 0.6–2.2)
ANC (CHCC HP manual diff): 2 10*3/uL (ref 1.5–6.7)
BASO: 1 % (ref 0–2)
EOS: 1 % (ref 0–7)
LYMPH: 32 % (ref 14–48)
MONO: 4 % (ref 0–13)
PLT EST ~~LOC~~: ADEQUATE
SEG: 62 % (ref 40–75)

## 2016-08-16 LAB — CMP (CANCER CENTER ONLY)
ALBUMIN: 3.4 g/dL (ref 3.3–5.5)
ALT(SGPT): 16 U/L (ref 10–47)
AST: 20 U/L (ref 11–38)
Alkaline Phosphatase: 68 U/L (ref 26–84)
BILIRUBIN TOTAL: 0.5 mg/dL (ref 0.20–1.60)
BUN, Bld: 21 mg/dL (ref 7–22)
CALCIUM: 10.5 mg/dL — AB (ref 8.0–10.3)
CO2: 26 mEq/L (ref 18–33)
Chloride: 104 mEq/L (ref 98–108)
Creat: 1.7 mg/dl — ABNORMAL HIGH (ref 0.6–1.2)
GLUCOSE: 160 mg/dL — AB (ref 73–118)
POTASSIUM: 4.2 meq/L (ref 3.3–4.7)
Sodium: 138 mEq/L (ref 128–145)
Total Protein: 7.1 g/dL (ref 6.4–8.1)

## 2016-08-16 LAB — CBC WITH DIFFERENTIAL (CANCER CENTER ONLY)
HEMATOCRIT: 31 % — AB (ref 34.8–46.6)
HEMOGLOBIN: 9.9 g/dL — AB (ref 11.6–15.9)
MCH: 30.8 pg (ref 26.0–34.0)
MCHC: 31.9 g/dL — AB (ref 32.0–36.0)
MCV: 97 fL (ref 81–101)
Platelets: 270 10*3/uL (ref 145–400)
RBC: 3.21 10*6/uL — ABNORMAL LOW (ref 3.70–5.32)
RDW: 14.5 % (ref 11.1–15.7)
WBC: 3.3 10*3/uL — ABNORMAL LOW (ref 3.9–10.0)

## 2016-08-16 MED ORDER — DARBEPOETIN ALFA 300 MCG/0.6ML IJ SOSY
300.0000 ug | PREFILLED_SYRINGE | Freq: Once | INTRAMUSCULAR | Status: AC
  Administered 2016-08-16: 300 ug via SUBCUTANEOUS

## 2016-08-16 MED ORDER — DARBEPOETIN ALFA 300 MCG/0.6ML IJ SOSY
PREFILLED_SYRINGE | INTRAMUSCULAR | Status: AC
  Filled 2016-08-16: qty 0.6

## 2016-08-16 NOTE — Progress Notes (Signed)
Hematology and Oncology Follow Up Visit  Allison Page [id number] [birth year] 83 y.o. 08/16/2016   Principle Diagnosis:   Anemia of renal insufficiency  Iron deficiency anemia  Current Therapy:    IV iron as indicated  Aranesp 300 g subcutaneous for hemoglobin less than 10 - last dose given on January 2018     Interim History:  Allison Page is back for follow-up.  She is doing okay. She is lost a little weight since her last saw her. She feels that she is eating okay. Her daughter, however, feels that she is not eating as much.  She had pneumonia a few months ago. She was not hospitalized.  Her last iron studies back in January showed a ferritin of 121 with iron saturation of 26%.  She's had no bleeding. She's had no nausea or vomiting. Sometimes, she did have some vomiting, depending on what she ate.  There's been no leg swelling. She's had no rashes.   Overall, her performance status is ECOG 2.   Medications:  Current Outpatient Prescriptions:  .  acetaminophen (TYLENOL) 500 MG tablet, Take 1 tablet (500 mg total) by mouth every 6 (six) hours as needed., Disp: 270 tablet, Rfl: 2 .  aluminum-magnesium hydroxide-simethicone (MAALOX) 852-778-24 MG/5ML SUSP, 2 teaspoons by mouth 3 times daily as needed for heartburn, Disp: , Rfl:  .  amLODipine (NORVASC) 10 MG tablet, TAKE (1/2) TABLET BY MOUTH ONCE DAILY., Disp: 15 tablet, Rfl: 5 .  amLODipine (NORVASC) 5 MG tablet, Take 5 mg by mouth daily., Disp: , Rfl:  .  aspirin 81 MG tablet, TAKE ONE TABLET BY MOUTH ONCE DAILY., Disp: 30 tablet, Rfl: 11 .  atorvastatin (LIPITOR) 20 MG tablet, TAKE (1) TABLET BY MOUTH AT BEDTIME., Disp: 30 tablet, Rfl: 5 .  clindamycin (CLEOCIN) 150 MG capsule, 4 by mouth 1 hour prior to dental procedures, Disp: 4 capsule, Rfl: 0 .  conjugated estrogens (PREMARIN) vaginal cream, Place 1 Applicatorful vaginally daily., Disp: 42.5 g, Rfl: 12 .  darifenacin (ENABLEX) 7.5 MG 24 hr tablet, Take 1 tablet  (7.5 mg total) by mouth daily., Disp: 90 tablet, Rfl: 3 .  donepezil (ARICEPT) 10 MG tablet, TAKE ONE TABLET BY MOUTH ONCE DAILY., Disp: 30 tablet, Rfl: 5 .  fexofenadine (ALLEGRA) 180 MG tablet, Take 1 tablet (180 mg total) by mouth daily., Disp: 30 tablet, Rfl: 11 .  gabapentin (NEURONTIN) 100 MG capsule, Take 1 capsule (100 mg total) by mouth 3 (three) times daily., Disp: 90 capsule, Rfl: 5 .  guaiFENesin (MUCINEX) 600 MG 12 hr tablet, Take 1 tablet (600 mg total) by mouth 2 (two) times daily as needed for cough or to loosen phlegm., Disp: 60 tablet, Rfl: 1 .  hydrALAZINE (APRESOLINE) 25 MG tablet, TAKE (1) TABLET BY MOUTH (3) TIMES DAILY., Disp: 90 tablet, Rfl: 0 .  levothyroxine (SYNTHROID, LEVOTHROID) 100 MCG tablet, TAKE ONE TABLET BY MOUTH ONCE DAILY., Disp: 30 tablet, Rfl: 2 .  magnesium oxide (MAG-OX) 400 MG tablet, Take 400 mg by mouth daily., Disp: , Rfl:  .  Melatonin 3 MG TABS, Take 1 tablet (3 mg total) by mouth at bedtime., Disp: 30 tablet, Rfl: 11 .  NASACORT ALLERGY 24HR 55 MCG/ACT AERO nasal inhaler, SPRAY 2 SPRAYS INTO EACH NOSTRIL DAILY., Disp: 10.8 mL, Rfl: 5 .  pantoprazole (PROTONIX) 40 MG tablet, TAKE (1) TABLET BY MOUTH TWICE DAILY 30-60 MINUTES BEFORE BREAKFAST AND DINNER., Disp: 60 tablet, Rfl: 5 .  polyethylene glycol (MIRALAX / GLYCOLAX) packet, Take 17  g by mouth daily., Disp: , Rfl:  .  senna-docusate (SENOKOT-S) 8.6-50 MG per tablet, Take 1-2 tablets, IF NEEDED, every 12 hrs for constipation, Disp: 60 tablet, Rfl: 4 .  traMADol (ULTRAM) 50 MG tablet, TAKE (1) TABLET BY MOUTH (3) TIMES DAILY., Disp: 270 tablet, Rfl: 1 .  vitamin B-12 (CYANOCOBALAMIN) 1000 MCG tablet, TAKE ONE TABLET BY MOUTH ONCE DAILY., Disp: 30 tablet, Rfl: 0  Allergies:  Allergies  Allergen Reactions  . Allegra Allergy [Fexofenadine Hcl] Anaphylaxis  . Amoxicillin   . Penicillins   . Sulfa Antibiotics     Past Medical History, Surgical history, Social history, and Family History were  reviewed and updated.  Review of Systems: As above  Physical Exam:  weight is 114 lb (51.7 kg). Her oral temperature is 97.8 F (36.6 C). Her blood pressure is 176/48 (abnormal) and her pulse is 67. Her respiration is 20 and oxygen saturation is 97%.   Wt Readings from Last 3 Encounters:  08/16/16 114 lb (51.7 kg)  07/14/16 120 lb (54.4 kg)  06/08/16 120 lb (54.4 kg)     Elderly white female in no obvious distress. Head and neck exam shows no ocular or oral lesions. There are no palpable cervical or supraclavicular lymph nodes. Lungs are clear. Cardiac exam regular rate and rhythm with no murmurs, rubs or bruits. Abdomen is soft. She has good bowel sounds. There may be a little bit of distention. There is no fluid wave. There is no palpable liver or spleen tip. Extremities shows some trace edema in her legs. She has some age-related osteoarthritic changes in her joints. She has 4+/5 strength in her extremities. Skin exam shows no rashes, ecchymoses or petechia. Neurological exam shows no focal neurological deficits.  Lab Results  Component Value Date   WBC 3.1 (L) 03/30/2016   HGB 10.9 (L) 03/30/2016   HCT 33.3 (L) 03/30/2016   MCV 91.9 03/30/2016   PLT 312.0 03/30/2016     Chemistry      Component Value Date/Time   NA 138 03/30/2016 1612   NA 137 10/04/2014 1333   K 3.8 03/30/2016 1612   K 4.5 10/04/2014 1333   CL 104 03/30/2016 1612   CL 106 10/04/2014 1333   CO2 27 03/30/2016 1612   CO2 25 10/04/2014 1333   BUN 23 03/30/2016 1612   BUN 25 (H) 10/04/2014 1333   CREATININE 1.51 (H) 03/30/2016 1612   CREATININE 1.8 (H) 10/04/2014 1333      Component Value Date/Time   CALCIUM 10.3 04/15/2016 1105   CALCIUM 10.8 (H) 10/04/2014 1333   ALKPHOS 72 03/30/2016 1612   ALKPHOS 59 10/04/2014 1333   AST 13 03/30/2016 1612   AST 19 10/04/2014 1333   ALT 8 03/30/2016 1612   ALT 10 10/04/2014 1333   BILITOT 0.2 03/30/2016 1612   BILITOT 0.40 10/04/2014 1333          Impression and Plan: Allison Page is 82 year old white female.  She has anemia of renal insufficiency and iron deficiency anemia. Her erythropoietin level was only  21 when we checked her.   I am going give her a dose of Aranesp today. This always works for her.   The weight loss is a little troublesome. I want to make sure that nothing else is going on. I will have her come back in 2 months. I think this would be very reasonable so we can just keep a check on her.Volanda Napoleon, MD 7/16/20181:49 PM

## 2016-08-16 NOTE — Patient Instructions (Signed)

## 2016-08-17 LAB — FERRITIN: Ferritin: 102 ng/ml (ref 9–269)

## 2016-08-17 LAB — IRON AND TIBC
%SAT: 27 % (ref 21–57)
Iron: 67 ug/dL (ref 41–142)
TIBC: 244 ug/dL (ref 236–444)
UIBC: 177 ug/dL (ref 120–384)

## 2016-08-17 LAB — RETICULOCYTES: RETICULOCYTE COUNT: 1.3 % (ref 0.6–2.6)

## 2016-08-20 ENCOUNTER — Encounter: Payer: Self-pay | Admitting: Internal Medicine

## 2016-08-20 NOTE — Telephone Encounter (Signed)
shirron to see above  Done hardcopy to Shirron - the BP check order

## 2016-08-26 ENCOUNTER — Other Ambulatory Visit: Payer: Self-pay | Admitting: Internal Medicine

## 2016-08-26 MED ORDER — HYDRALAZINE HCL 50 MG PO TABS: 50.0000 mg | ORAL_TABLET | Freq: Three times a day (TID) | ORAL | 3 refills | Status: DC

## 2016-08-26 NOTE — Telephone Encounter (Signed)
Received information on BP's at [facility]  They are persistently mild to mod elevated.   OK to increase the hdyralazine to 50 mg three times per day  Shirron to please inform pt, I will do rx  Cont to monitor BP as before and pt has f/u appt aug 28

## 2016-08-26 NOTE — Telephone Encounter (Signed)
Pt's daughter has been informed and expressed understanding.  

## 2016-08-30 ENCOUNTER — Other Ambulatory Visit: Payer: Self-pay | Admitting: Internal Medicine

## 2016-09-09 ENCOUNTER — Encounter: Payer: Self-pay | Admitting: Internal Medicine

## 2016-09-09 DIAGNOSIS — H9190 Unspecified hearing loss, unspecified ear: Secondary | ICD-10-CM

## 2016-09-20 ENCOUNTER — Encounter: Payer: Self-pay | Admitting: Internal Medicine

## 2016-09-20 NOTE — Telephone Encounter (Signed)
PCC's to please see above pt concern

## 2016-09-28 ENCOUNTER — Ambulatory Visit (INDEPENDENT_AMBULATORY_CARE_PROVIDER_SITE_OTHER): Payer: Medicare Other | Admitting: Internal Medicine

## 2016-09-28 ENCOUNTER — Ambulatory Visit: Payer: Medicare Other

## 2016-09-28 ENCOUNTER — Encounter: Payer: Self-pay | Admitting: Internal Medicine

## 2016-09-28 ENCOUNTER — Other Ambulatory Visit (INDEPENDENT_AMBULATORY_CARE_PROVIDER_SITE_OTHER): Payer: Medicare Other

## 2016-09-28 VITALS — BP 144/78 | HR 80 | Temp 98.1°F | Ht 62.0 in | Wt 120.0 lb

## 2016-09-28 DIAGNOSIS — E119 Type 2 diabetes mellitus without complications: Secondary | ICD-10-CM

## 2016-09-28 DIAGNOSIS — M542 Cervicalgia: Secondary | ICD-10-CM

## 2016-09-28 DIAGNOSIS — D509 Iron deficiency anemia, unspecified: Secondary | ICD-10-CM

## 2016-09-28 DIAGNOSIS — E559 Vitamin D deficiency, unspecified: Secondary | ICD-10-CM

## 2016-09-28 DIAGNOSIS — M545 Low back pain: Secondary | ICD-10-CM | POA: Diagnosis not present

## 2016-09-28 DIAGNOSIS — M25511 Pain in right shoulder: Secondary | ICD-10-CM | POA: Diagnosis not present

## 2016-09-28 DIAGNOSIS — D631 Anemia in chronic kidney disease: Secondary | ICD-10-CM

## 2016-09-28 DIAGNOSIS — R32 Unspecified urinary incontinence: Secondary | ICD-10-CM | POA: Diagnosis not present

## 2016-09-28 DIAGNOSIS — R7989 Other specified abnormal findings of blood chemistry: Secondary | ICD-10-CM

## 2016-09-28 DIAGNOSIS — E215 Disorder of parathyroid gland, unspecified: Secondary | ICD-10-CM | POA: Diagnosis not present

## 2016-09-28 DIAGNOSIS — N183 Chronic kidney disease, stage 3 (moderate): Secondary | ICD-10-CM | POA: Diagnosis not present

## 2016-09-28 DIAGNOSIS — M25512 Pain in left shoulder: Secondary | ICD-10-CM | POA: Diagnosis not present

## 2016-09-28 LAB — HEPATIC FUNCTION PANEL
ALT: 8 U/L (ref 0–35)
AST: 13 U/L (ref 0–37)
Albumin: 4.2 g/dL (ref 3.5–5.2)
Alkaline Phosphatase: 61 U/L (ref 39–117)
BILIRUBIN TOTAL: 0.2 mg/dL (ref 0.2–1.2)
Bilirubin, Direct: 0.2 mg/dL (ref 0.0–0.3)
Total Protein: 7.3 g/dL (ref 6.0–8.3)

## 2016-09-28 LAB — LIPID PANEL
Cholesterol: 145 mg/dL (ref 0–200)
HDL: 30.5 mg/dL — AB (ref >39.00)
Total CHOL/HDL Ratio: 5

## 2016-09-28 LAB — URINALYSIS, ROUTINE W REFLEX MICROSCOPIC
BILIRUBIN URINE: NEGATIVE
HGB URINE DIPSTICK: NEGATIVE
Ketones, ur: NEGATIVE
LEUKOCYTES UA: NEGATIVE
NITRITE: NEGATIVE
RBC / HPF: NONE SEEN (ref 0–?)
Specific Gravity, Urine: 1.01 (ref 1.000–1.030)
TOTAL PROTEIN, URINE-UPE24: 100 — AB
URINE GLUCOSE: NEGATIVE
Urobilinogen, UA: 0.2 (ref 0.0–1.0)
WBC, UA: NONE SEEN (ref 0–?)
pH: 7 (ref 5.0–8.0)

## 2016-09-28 LAB — BASIC METABOLIC PANEL
BUN: 24 mg/dL — AB (ref 6–23)
CALCIUM: 10.7 mg/dL — AB (ref 8.4–10.5)
CO2: 28 mEq/L (ref 19–32)
CREATININE: 1.58 mg/dL — AB (ref 0.40–1.20)
Chloride: 105 mEq/L (ref 96–112)
GFR: 32.49 mL/min — AB (ref >60.00)
Glucose, Bld: 145 mg/dL — ABNORMAL HIGH (ref 70–99)
POTASSIUM: 3.8 meq/L (ref 3.5–5.1)
Sodium: 138 mEq/L (ref 135–145)

## 2016-09-28 LAB — VITAMIN D 25 HYDROXY (VIT D DEFICIENCY, FRACTURES): VITD: 13.93 ng/mL — AB (ref 30.00–100.00)

## 2016-09-28 LAB — HEMOGLOBIN A1C: Hgb A1c MFr Bld: 6.2 % (ref 4.6–6.5)

## 2016-09-28 LAB — LDL CHOLESTEROL, DIRECT: Direct LDL: 63 mg/dL

## 2016-09-28 MED ORDER — POLYETHYLENE GLYCOL 3350 17 G PO PACK: 17.0000 g | PACK | Freq: Every day | ORAL | 3 refills | Status: DC

## 2016-09-28 NOTE — Patient Instructions (Signed)
Please continue all other medications as before, and refills have been done if requested.  Please have the pharmacy call with any other refills you may need.  Please continue your efforts at being more active, low cholesterol diet, and weight control.  You are otherwise up to date with prevention measures today.  Please keep your appointments with your specialists as you may have planned  You will be contacted regarding the referral for: Physical Therapy  Please go to the LAB in the Basement (turn left off the elevator) for the tests to be done today  You will be contacted by phone if any changes need to be made immediately.  Otherwise, you will receive a letter about your results with an explanation, but please check with MyChart first.  Please remember to sign up for MyChart if you have not done so, as this will be important to you in the future with finding out test results, communicating by private email, and scheduling acute appointments online when needed.  Please return in 6 months, or sooner if needed 

## 2016-09-28 NOTE — Progress Notes (Signed)
   Subjective:    Patient ID: Allison Page, female    DOB: December 09, 1922, 83 y.o.   MRN: [id number]  HPI  Here to f/u with daughter; overall doing ok,  Pt denies chest pain, increasing sob or doe, wheezing, orthopnea, PND, increased LE swelling, palpitations, dizziness or syncope.  Pt denies new neurological symptoms such as new headache, or facial or extremity weakness or numbness.  Pt denies polydipsia, polyuria,  Pt states overall good compliance with meds, mostly trying to follow appropriate diet, with wt overall stable Wt Readings from Last 3 Encounters:  09/28/16 120 lb (54.4 kg)  08/16/16 114 lb (51.7 kg)  07/14/16 120 lb (54.4 kg)  Has some urinary leakage and incontinence but Denies urinary symptoms such as dysuria, frequency, urgency, flank pain, hematuria or n/v, fever, chills.  Has ongoing pain but improved with PT for arm, shoulders and neck, now needs to restart per daughter as symptoms recurred. Review of Systems  Constitutional: Negative for other unusual diaphoresis or sweats HENT: Negative for ear discharge or swelling Eyes: Negative for other worsening visual disturbances Respiratory: Negative for stridor or other swelling  Gastrointestinal: Negative for worsening distension or other blood Genitourinary: Negative for retention or other urinary change Musculoskeletal: Negative for other MSK pain or swelling Skin: Negative for color change or other new lesions Neurological: Negative for worsening tremors and other numbness  Psychiatric/Behavioral: Negative for worsening agitation or other fatigue All other system neg per pt and daughter    Objective:   Physical Exam BP (!) 144/78   Pulse 80   Temp 98.1 F (36.7 C) (Oral)   Ht 5\' 2"  (1.575 m)   Wt 120 lb (54.4 kg)   SpO2 99%   BMI 21.95 kg/m  VS noted,  Constitutional: Pt appears in NAD HENT: Head: NCAT.  Right Ear: External ear normal.  Left Ear: External ear normal.  Eyes: . Pupils are equal, round, and  reactive to light. Conjunctivae and EOM are normal Nose: without d/c or deformity Neck: Neck supple. Gross normal ROM Cardiovascular: Normal rate and regular rhythm.   Pulmonary/Chest: Effort normal and breath sounds without rales or wheezing.  Abd:  Soft, NT, ND, + BS, no organomegaly Neurological: Pt is alert. At baseline orientation, motor grossly intact Skin: Skin is warm. No rashes, other new lesions, no LE edema Psychiatric: Pt behavior is normal without agitation  No other exam findings    Assessment & Plan:

## 2016-09-29 LAB — URINE CULTURE

## 2016-09-30 NOTE — Assessment & Plan Note (Signed)
Also for Vit D level, consider endo referral

## 2016-09-30 NOTE — Assessment & Plan Note (Signed)
stable overall by history and exam, recent data reviewed with pt, and pt to continue medical treatment as before,  to f/u any worsening symptoms or concerns Lab Results  Component Value Date   HGBA1C 6.2 09/28/2016

## 2016-09-30 NOTE — Assessment & Plan Note (Signed)
Mild worsening, for PT referral,  to f/u any worsening symptoms or concerns

## 2016-09-30 NOTE — Assessment & Plan Note (Signed)
Also for UA, but exam less likely to be c/w UTI

## 2016-09-30 NOTE — Assessment & Plan Note (Signed)
No overt bleeding, for cbc and iron f/u labs

## 2016-09-30 NOTE — Assessment & Plan Note (Signed)
stable overall by history and exam, recent data reviewed with pt, and pt to continue medical treatment as before,  to f/u any worsening symptoms or concerns, for f/u lab as per orders

## 2016-10-01 ENCOUNTER — Telehealth: Payer: Self-pay | Admitting: Internal Medicine

## 2016-10-01 NOTE — Telephone Encounter (Signed)
Notified Laura w/ MD response../lmb ?

## 2016-10-01 NOTE — Telephone Encounter (Signed)
Ok for verbals 

## 2016-10-01 NOTE — Telephone Encounter (Signed)
[facility]  [phone]  Need verbals for PT  2 week 2 Hold for one week  2 week 4

## 2016-10-05 ENCOUNTER — Encounter: Payer: Self-pay | Admitting: Internal Medicine

## 2016-10-06 NOTE — Telephone Encounter (Signed)
Done hardcopy to [place]  

## 2016-10-14 ENCOUNTER — Inpatient Hospital Stay: Admission: RE | Admit: 2016-10-14 | Payer: Medicare Other | Source: Ambulatory Visit

## 2016-10-28 ENCOUNTER — Ambulatory Visit: Payer: Medicare Other | Admitting: Hematology & Oncology

## 2016-10-28 ENCOUNTER — Other Ambulatory Visit: Payer: Medicare Other

## 2016-11-02 LAB — HM DIABETES EYE EXAM

## 2016-11-09 ENCOUNTER — Ambulatory Visit (INDEPENDENT_AMBULATORY_CARE_PROVIDER_SITE_OTHER)
Admission: RE | Admit: 2016-11-09 | Discharge: 2016-11-09 | Disposition: A | Discharge status: Home / Self Care | Payer: Medicare Other | Source: Ambulatory Visit | Attending: Internal Medicine | Admitting: Internal Medicine

## 2016-11-09 DIAGNOSIS — E2839 Other primary ovarian failure: Secondary | ICD-10-CM | POA: Diagnosis not present

## 2016-11-15 ENCOUNTER — Telehealth: Payer: Self-pay

## 2016-11-15 NOTE — Telephone Encounter (Signed)
-----   Message from Biagio Borg, MD sent at 11/12/2016 12:08 PM EDT ----- Left message on MyChart, pt to cont same tx except  The test results show that your current treatment is OK, except the Bone Density is consistent with severe osteoporosis.  We would like to start Prolia shots every 6 mo, as this can reduce the risk of bone fracture related to falls, and normally is very well tolerated, and is usually covered most if not all with the insurance.  Please let me know if this is OK, or this can be discussed at your next visit.  I would not offer pill treatment such as fosamax due to risk of side effects.Redmond Baseman to please inform pt, and let Jonelle Sidle know if pt wants to start the Prolia

## 2016-11-15 NOTE — Telephone Encounter (Signed)
Patient's insurance will be verified and I will call patient back to discuss copay---benefits for prolia

## 2016-11-15 NOTE — Telephone Encounter (Signed)
Pt is interested in doing Prolia shot.

## 2016-11-16 ENCOUNTER — Encounter: Payer: Self-pay | Admitting: Hematology & Oncology

## 2016-11-26 ENCOUNTER — Encounter: Payer: Self-pay | Admitting: Internal Medicine

## 2016-11-29 ENCOUNTER — Ambulatory Visit (HOSPITAL_BASED_OUTPATIENT_CLINIC_OR_DEPARTMENT_OTHER): Payer: Medicare Other | Admitting: Hematology & Oncology

## 2016-11-29 ENCOUNTER — Other Ambulatory Visit (HOSPITAL_BASED_OUTPATIENT_CLINIC_OR_DEPARTMENT_OTHER): Payer: Medicare Other

## 2016-11-29 ENCOUNTER — Ambulatory Visit (HOSPITAL_BASED_OUTPATIENT_CLINIC_OR_DEPARTMENT_OTHER): Payer: Medicare Other

## 2016-11-29 VITALS — BP 149/43 | HR 68 | Temp 98.1°F | Resp 16 | Wt 124.0 lb

## 2016-11-29 DIAGNOSIS — N183 Chronic kidney disease, stage 3 unspecified: Secondary | ICD-10-CM

## 2016-11-29 DIAGNOSIS — D631 Anemia in chronic kidney disease: Secondary | ICD-10-CM | POA: Diagnosis not present

## 2016-11-29 DIAGNOSIS — D500 Iron deficiency anemia secondary to blood loss (chronic): Secondary | ICD-10-CM | POA: Diagnosis present

## 2016-11-29 DIAGNOSIS — Z794 Long term (current) use of insulin: Secondary | ICD-10-CM

## 2016-11-29 DIAGNOSIS — E1110 Type 2 diabetes mellitus with ketoacidosis without coma: Secondary | ICD-10-CM

## 2016-11-29 LAB — CBC WITH DIFFERENTIAL (CANCER CENTER ONLY)
HEMATOCRIT: 28.5 % — AB (ref 34.8–46.6)
HGB: 9 g/dL — ABNORMAL LOW (ref 11.6–15.9)
MCH: 31 pg (ref 26.0–34.0)
MCHC: 31.6 g/dL — AB (ref 32.0–36.0)
MCV: 98 fL (ref 81–101)
RBC: 2.9 10*6/uL — ABNORMAL LOW (ref 3.70–5.32)
RDW: 15.4 % (ref 11.1–15.7)
WBC: 3 10*3/uL — AB (ref 3.9–10.0)

## 2016-11-29 LAB — CMP (CANCER CENTER ONLY)
ALK PHOS: 63 U/L (ref 26–84)
ALT: 19 U/L (ref 10–47)
AST: 22 U/L (ref 11–38)
Albumin: 3.3 g/dL (ref 3.3–5.5)
BUN: 28 mg/dL — AB (ref 7–22)
CALCIUM: 10.5 mg/dL — AB (ref 8.0–10.3)
CHLORIDE: 106 meq/L (ref 98–108)
CO2: 27 mEq/L (ref 18–33)
Creat: 2.1 mg/dl — ABNORMAL HIGH (ref 0.6–1.2)
GLUCOSE: 145 mg/dL — AB (ref 73–118)
POTASSIUM: 3.9 meq/L (ref 3.3–4.7)
Sodium: 141 mEq/L (ref 128–145)
Total Bilirubin: 0.4 mg/dl (ref 0.20–1.60)
Total Protein: 6.7 g/dL (ref 6.4–8.1)

## 2016-11-29 LAB — MANUAL DIFFERENTIAL (CHCC SATELLITE)
ALC: 0.9 10*3/uL (ref 0.6–2.2)
ANC (CHCC HP manual diff): 2 10*3/uL (ref 1.5–6.7)
LYMPH: 30 % (ref 14–48)
MONO: 4 % (ref 0–13)
PLT EST ~~LOC~~: ADEQUATE
SEG: 66 % (ref 40–75)

## 2016-11-29 MED ORDER — DARBEPOETIN ALFA 300 MCG/0.6ML IJ SOSY
PREFILLED_SYRINGE | INTRAMUSCULAR | Status: AC
  Filled 2016-11-29: qty 0.6

## 2016-11-29 MED ORDER — DARBEPOETIN ALFA 300 MCG/0.6ML IJ SOSY
300.0000 ug | PREFILLED_SYRINGE | Freq: Once | INTRAMUSCULAR | Status: AC
  Administered 2016-11-29: 300 ug via SUBCUTANEOUS

## 2016-11-29 NOTE — Patient Instructions (Signed)

## 2016-11-29 NOTE — Progress Notes (Signed)
Hematology and Oncology Follow Up Visit  Allison Page [id number] [birth year] 83 y.o. 11/29/2016   Principle Diagnosis:   Anemia of renal insufficiency  Iron deficiency anemia  Current Therapy:    IV iron as indicated  Aranesp 300 g subcutaneous for hemoglobin less than 10 - last dose given on January 2018     Interim History:  Ms. Page is back for follow-up. She is doing better. She has had no problems since we saw her back in July.  She has gained weight. She is eating a little bit better.  We gave her some Aranesp back in July.  Her iron studies back in July showed a ferritin of 102 with an iron saturation of 27%.  She's had no problems with bowels or bladder. She does take a laxative. She's had no cough or shortness of breath. She's had chronic leg swelling. She wears compression stockings.  She likes to walk. This is her best form of exercise.  Overall, her performance status is ECOG 2.  Medications:  Current Outpatient Prescriptions:  .  acetaminophen (TYLENOL) 500 MG tablet, Take 1 tablet (500 mg total) by mouth every 6 (six) hours as needed., Disp: 270 tablet, Rfl: 2 .  aluminum-magnesium hydroxide-simethicone (MAALOX) 130-865-78 MG/5ML SUSP, 2 teaspoons by mouth 3 times daily as needed for heartburn, Disp: , Rfl:  .  amLODipine (NORVASC) 10 MG tablet, TAKE (1/2) TABLET BY MOUTH ONCE DAILY., Disp: 15 tablet, Rfl: 5 .  aspirin 81 MG tablet, TAKE ONE TABLET BY MOUTH ONCE DAILY., Disp: 30 tablet, Rfl: 11 .  atorvastatin (LIPITOR) 20 MG tablet, TAKE (1) TABLET BY MOUTH AT BEDTIME., Disp: 30 tablet, Rfl: 5 .  clindamycin (CLEOCIN) 150 MG capsule, 4 by mouth 1 hour prior to dental procedures, Disp: 4 capsule, Rfl: 0 .  conjugated estrogens (PREMARIN) vaginal cream, Place 1 Applicatorful vaginally daily., Disp: 42.5 g, Rfl: 12 .  darifenacin (ENABLEX) 7.5 MG 24 hr tablet, Take 1 tablet (7.5 mg total) by mouth daily., Disp: 90 tablet, Rfl: 3 .  donepezil (ARICEPT)  10 MG tablet, TAKE ONE TABLET BY MOUTH ONCE DAILY., Disp: 30 tablet, Rfl: 5 .  gabapentin (NEURONTIN) 100 MG capsule, Take 1 capsule (100 mg total) by mouth 3 (three) times daily., Disp: 90 capsule, Rfl: 5 .  guaiFENesin (MUCINEX) 600 MG 12 hr tablet, Take 1 tablet (600 mg total) by mouth 2 (two) times daily as needed for cough or to loosen phlegm., Disp: 60 tablet, Rfl: 1 .  hydrALAZINE (APRESOLINE) 50 MG tablet, Take 1 tablet (50 mg total) by mouth 3 (three) times daily., Disp: 270 tablet, Rfl: 3 .  levothyroxine (SYNTHROID, LEVOTHROID) 100 MCG tablet, TAKE ONE TABLET BY MOUTH ONCE DAILY., Disp: 30 tablet, Rfl: 2 .  magnesium oxide (MAG-OX) 400 MG tablet, Take 400 mg by mouth daily., Disp: , Rfl:  .  Melatonin 3 MG TABS, Take 1 tablet (3 mg total) by mouth at bedtime., Disp: 30 tablet, Rfl: 11 .  NASACORT ALLERGY 24HR 55 MCG/ACT AERO nasal inhaler, SPRAY 2 SPRAYS INTO EACH NOSTRIL DAILY., Disp: 10.8 mL, Rfl: 5 .  pantoprazole (PROTONIX) 40 MG tablet, TAKE (1) TABLET BY MOUTH TWICE DAILY 30-60 MINUTES BEFORE BREAKFAST AND DINNER., Disp: 60 tablet, Rfl: 5 .  polyethylene glycol (MIRALAX / GLYCOLAX) packet, Take 17 g by mouth daily., Disp: 90 each, Rfl: 3 .  senna-docusate (SENOKOT-S) 8.6-50 MG per tablet, Take 1-2 tablets, IF NEEDED, every 12 hrs for constipation, Disp: 60 tablet, Rfl: 4 .  traMADol (ULTRAM) 50 MG tablet, TAKE (1) TABLET BY MOUTH (3) TIMES DAILY., Disp: 270 tablet, Rfl: 1 .  vitamin B-12 (CYANOCOBALAMIN) 1000 MCG tablet, TAKE ONE TABLET BY MOUTH ONCE DAILY., Disp: 30 tablet, Rfl: 0  Allergies:  Allergies  Allergen Reactions  . Allegra Allergy [Fexofenadine Hcl] Anaphylaxis  . Amoxicillin   . Penicillins   . Sulfa Antibiotics     Past Medical History, Surgical history, Social history, and Family History were reviewed and updated.  Review of Systems: As stated in the interim history  Physical Exam:  weight is 124 lb (56.2 kg). Her oral temperature is 98.1 F (36.7 C). Her  blood pressure is 149/43 (abnormal) and her pulse is 68. Her respiration is 16 and oxygen saturation is 96%.   Wt Readings from Last 3 Encounters:  11/29/16 124 lb (56.2 kg)  09/28/16 120 lb (54.4 kg)  08/16/16 114 lb (51.7 kg)     I examined Allison Page. The results of my examination are noted below:   Elderly white female in no obvious distress. Head and neck exam shows no ocular or oral lesions. There are no palpable cervical or supraclavicular lymph nodes. Lungs are clear. Cardiac exam regular rate and rhythm with no murmurs, rubs or bruits. Abdomen is soft. She has good bowel sounds. There may be a little bit of distention. There is no fluid wave. There is no palpable liver or spleen tip. Extremities shows some trace edema in her legs. She has some age-related osteoarthritic changes in her joints. She has 4+/5 strength in her extremities. Skin exam shows no rashes, ecchymoses or petechia. Neurological exam shows no focal neurological deficits.  Lab Results  Component Value Date   WBC 3.3 (L) 08/16/2016   HGB 9.9 (L) 08/16/2016   HCT 31.0 (L) 08/16/2016   MCV 97 08/16/2016   PLT 270 Few Large platelets present 08/16/2016     Chemistry      Component Value Date/Time   NA 141 11/29/2016 1314   K 3.9 11/29/2016 1314   CL 106 11/29/2016 1314   CO2 27 11/29/2016 1314   BUN 28 (H) 11/29/2016 1314   CREATININE 2.1 (H) 11/29/2016 1314      Component Value Date/Time   CALCIUM 10.5 (H) 11/29/2016 1314   ALKPHOS 63 11/29/2016 1314   AST 22 11/29/2016 1314   ALT 19 11/29/2016 1314   BILITOT 0.40 11/29/2016 1314       Impression and Plan: Allison Page is 83 year old white female.  She has anemia of renal insufficiency and iron deficiency anemia. Her erythropoietin level was only  21 when we checked her.   We will go ahead and give her a dose of Aranesp today.  I'll like to see her back in 4 weeks. I want to make sure that her blood counts are adequate for the upcoming holiday  season.  We will see what her iron levels look like.   Volanda Napoleon, MD 10/29/20182:26 PM

## 2016-11-30 ENCOUNTER — Encounter: Payer: Self-pay | Admitting: Internal Medicine

## 2016-11-30 ENCOUNTER — Telehealth: Payer: Self-pay | Admitting: *Deleted

## 2016-11-30 LAB — IRON AND TIBC
%SAT: 30 % (ref 21–57)
IRON: 75 ug/dL (ref 41–142)
TIBC: 246 ug/dL (ref 236–444)
UIBC: 171 ug/dL (ref 120–384)

## 2016-11-30 LAB — FERRITIN: Ferritin: 64 ng/ml (ref 9–269)

## 2016-11-30 LAB — RETICULOCYTES: RETICULOCYTE COUNT: 1.5 % (ref 0.6–2.6)

## 2016-11-30 NOTE — Telephone Encounter (Addendum)
Message left on daughters personal voice mail  ----- Message from Volanda Napoleon, MD sent at 11/30/2016  9:24 AM EDT ----- Call her daughter and tell her that her mom's iron levels are okay. Thanks

## 2016-12-03 MED ORDER — METOPROLOL SUCCINATE ER 25 MG PO TB24: 25.0000 mg | ORAL_TABLET | Freq: Every day | ORAL | 3 refills | Status: DC

## 2016-12-03 NOTE — Telephone Encounter (Signed)
BP Readings from Last 3 Encounters:  11/29/16 (!) 149/43  09/28/16 (!) 144/78  08/16/16 (!) 176/48   Recent documented BP's have been mildly elevated only, though improved since July.  OK for add toprol XL 25 mg per day - done erx  Shirron to please inform pt family, I will do rx

## 2016-12-03 NOTE — Telephone Encounter (Signed)
Allison Page - to address the prolia please

## 2016-12-03 NOTE — Telephone Encounter (Signed)
Pt daughter called about the status of this message

## 2016-12-03 NOTE — Addendum Note (Signed)
Addended by: Biagio Borg on: 12/03/2016 01:06 PM   Modules accepted: Orders

## 2016-12-03 NOTE — Telephone Encounter (Signed)
Done erx to shirron   Please fax to [facility]

## 2016-12-29 ENCOUNTER — Ambulatory Visit: Payer: Medicare Other | Admitting: Family

## 2016-12-29 ENCOUNTER — Ambulatory Visit: Payer: Medicare Other

## 2016-12-29 ENCOUNTER — Other Ambulatory Visit: Payer: Medicare Other

## 2016-12-31 ENCOUNTER — Encounter: Payer: Self-pay | Admitting: Internal Medicine

## 2017-01-28 ENCOUNTER — Ambulatory Visit (INDEPENDENT_AMBULATORY_CARE_PROVIDER_SITE_OTHER): Payer: Medicare Other

## 2017-01-28 DIAGNOSIS — M810 Age-related osteoporosis without current pathological fracture: Secondary | ICD-10-CM

## 2017-01-28 MED ORDER — DENOSUMAB 60 MG/ML ~~LOC~~ SOLN
60.0000 mg | Freq: Once | SUBCUTANEOUS | Status: AC
  Administered 2017-01-28: 60 mg via SUBCUTANEOUS

## 2017-01-29 NOTE — Progress Notes (Signed)
Medical screening examination/treatment/procedure(s) were performed by non-physician practitioner and as supervising physician I was immediately available for consultation/collaboration. I agree with above. Ebert Forrester, MD   

## 2017-02-07 ENCOUNTER — Inpatient Hospital Stay: Payer: Medicare Other | Attending: Hematology & Oncology

## 2017-02-07 ENCOUNTER — Inpatient Hospital Stay (HOSPITAL_BASED_OUTPATIENT_CLINIC_OR_DEPARTMENT_OTHER): Payer: Medicare Other | Admitting: Family

## 2017-02-07 ENCOUNTER — Inpatient Hospital Stay: Payer: Medicare Other

## 2017-02-07 ENCOUNTER — Encounter: Payer: Self-pay | Admitting: Family

## 2017-02-07 ENCOUNTER — Other Ambulatory Visit: Payer: Self-pay

## 2017-02-07 VITALS — BP 171/45 | HR 57 | Temp 98.3°F | Resp 18 | Wt 125.0 lb

## 2017-02-07 DIAGNOSIS — K5900 Constipation, unspecified: Secondary | ICD-10-CM | POA: Insufficient documentation

## 2017-02-07 DIAGNOSIS — D6489 Other specified anemias: Secondary | ICD-10-CM | POA: Diagnosis not present

## 2017-02-07 DIAGNOSIS — D631 Anemia in chronic kidney disease: Secondary | ICD-10-CM | POA: Insufficient documentation

## 2017-02-07 DIAGNOSIS — D500 Iron deficiency anemia secondary to blood loss (chronic): Secondary | ICD-10-CM

## 2017-02-07 DIAGNOSIS — N183 Chronic kidney disease, stage 3 unspecified: Secondary | ICD-10-CM

## 2017-02-07 DIAGNOSIS — N289 Disorder of kidney and ureter, unspecified: Secondary | ICD-10-CM | POA: Insufficient documentation

## 2017-02-07 DIAGNOSIS — Z79899 Other long term (current) drug therapy: Secondary | ICD-10-CM | POA: Insufficient documentation

## 2017-02-07 DIAGNOSIS — Z862 Personal history of diseases of the blood and blood-forming organs and certain disorders involving the immune mechanism: Secondary | ICD-10-CM | POA: Insufficient documentation

## 2017-02-07 DIAGNOSIS — E1110 Type 2 diabetes mellitus with ketoacidosis without coma: Secondary | ICD-10-CM

## 2017-02-07 DIAGNOSIS — Z7982 Long term (current) use of aspirin: Secondary | ICD-10-CM | POA: Insufficient documentation

## 2017-02-07 DIAGNOSIS — D509 Iron deficiency anemia, unspecified: Secondary | ICD-10-CM | POA: Diagnosis not present

## 2017-02-07 DIAGNOSIS — Z794 Long term (current) use of insulin: Secondary | ICD-10-CM

## 2017-02-07 LAB — CBC WITH DIFFERENTIAL (CANCER CENTER ONLY)
BASOS PCT: 0 %
Basophils Absolute: 0 10*3/uL (ref 0.0–0.1)
EOS ABS: 0.1 10*3/uL (ref 0.0–0.5)
EOS PCT: 3 %
HEMATOCRIT: 29.7 % — AB (ref 34.8–46.6)
HEMOGLOBIN: 9.4 g/dL — AB (ref 11.6–15.9)
LYMPHS PCT: 24 %
Lymphs Abs: 0.6 10*3/uL — ABNORMAL LOW (ref 0.9–3.3)
MCH: 31.5 pg (ref 26.0–34.0)
MCHC: 31.6 g/dL — AB (ref 32.0–36.0)
MCV: 99.7 fL (ref 81.0–101.0)
MONOS PCT: 4 %
Monocytes Absolute: 0.1 10*3/uL (ref 0.1–0.9)
NEUTROS PCT: 69 %
Neutro Abs: 1.7 10*3/uL (ref 1.5–6.5)
Platelet Count: 361 10*3/uL (ref 145–400)
RBC: 2.98 MIL/uL — ABNORMAL LOW (ref 3.70–5.32)
RDW: 14.9 % (ref 11.1–15.7)
WBC Count: 2.5 10*3/uL — ABNORMAL LOW (ref 4.0–10.3)

## 2017-02-07 LAB — RETICULOCYTES
RBC.: 3.02 MIL/uL — AB (ref 3.70–5.45)
Retic Count, Absolute: 45.3 10*3/uL (ref 33.7–90.7)
Retic Ct Pct: 1.5 % (ref 0.7–2.1)

## 2017-02-07 LAB — COMPREHENSIVE METABOLIC PANEL
ALBUMIN: 3.2 g/dL — AB (ref 3.5–5.0)
ALT: 15 U/L (ref 10–47)
AST: 15 U/L (ref 11–38)
Alkaline Phosphatase: 58 U/L (ref 26–84)
Anion gap: 14 (ref 5–15)
BILIRUBIN TOTAL: 0.4 mg/dL (ref 0.2–1.6)
BUN: 19 mg/dL (ref 7–22)
CALCIUM: 9 mg/dL (ref 8.0–10.3)
CO2: 24 mmol/L (ref 18–33)
CREATININE: 1.9 mg/dL — AB (ref 0.60–1.20)
Chloride: 109 mmol/L — ABNORMAL HIGH (ref 98–108)
Glucose, Bld: 140 mg/dL — ABNORMAL HIGH (ref 73–118)
Potassium: 4.3 mmol/L (ref 3.5–5.1)
Sodium: 147 mmol/L — ABNORMAL HIGH (ref 128–145)
Total Protein: 6.6 g/dL (ref 6.4–8.1)

## 2017-02-07 MED ORDER — DARBEPOETIN ALFA 300 MCG/0.6ML IJ SOSY
300.0000 ug | PREFILLED_SYRINGE | Freq: Once | INTRAMUSCULAR | Status: AC
  Administered 2017-02-07: 300 ug via SUBCUTANEOUS

## 2017-02-07 NOTE — Progress Notes (Signed)
Hematology and Oncology Follow Up Visit  Allison Page [id number] [birth year] 84 y.o. 02/07/2017   Principle Diagnosis:  Anemia of renal insufficiency Iron deficiency anemia  Current Therapy:   IV iron as indicated Aranesp 300 g subcutaneous for hemoglobin less than 10 - last dose given on October 2018   Interim History:  Allison Page is here today for follow-up. She is doing well and denies fatigue at this time.  Hgb is stable at 9.4. We will give her Aranesp today as planned. Iron studies are pending.  She has had no episodes of bleeding, no bruising or petechiae.  No lymphadenopathy found on exam.  No fever, chills, n/v, cough, rash, dizziness, SOB, chest pain, palpitations, abdominal pain or changes in bowel or bladder habits.  She will take a dulcolax for constipation as needed.  No swelling, tenderness, numbness or tingling in her extremities. No c/o pain.  She has maintained a good appetite and is staying well hydrated. Her weight is stable.   ECOG Performance Status: 1 - Symptomatic but completely ambulatory  Medications:  Allergies as of 02/07/2017      Reactions   Allegra Allergy [fexofenadine Hcl] Anaphylaxis   Amoxicillin    Penicillins    Sulfa Antibiotics       Medication List        Accurate as of 02/07/17  3:04 PM. Always use your most recent med list.          acetaminophen 500 MG tablet Commonly known as:  TYLENOL Take 1 tablet (500 mg total) by mouth every 6 (six) hours as needed.   aluminum-magnesium hydroxide-simethicone 147-829-56 MG/5ML Susp Commonly known as:  MAALOX 2 teaspoons by mouth 3 times daily as needed for heartburn   amLODipine 10 MG tablet Commonly known as:  NORVASC TAKE (1/2) TABLET BY MOUTH ONCE DAILY.   aspirin 81 MG tablet TAKE ONE TABLET BY MOUTH ONCE DAILY.   atorvastatin 20 MG tablet Commonly known as:  LIPITOR TAKE (1) TABLET BY MOUTH AT BEDTIME.   clindamycin 150 MG capsule Commonly known as:  CLEOCIN 4 by  mouth 1 hour prior to dental procedures   conjugated estrogens vaginal cream Commonly known as:  PREMARIN Place 1 Applicatorful vaginally daily.   darifenacin 7.5 MG 24 hr tablet Commonly known as:  ENABLEX Take 1 tablet (7.5 mg total) by mouth daily.   donepezil 10 MG tablet Commonly known as:  ARICEPT TAKE ONE TABLET BY MOUTH ONCE DAILY.   gabapentin 100 MG capsule Commonly known as:  NEURONTIN Take 1 capsule (100 mg total) by mouth 3 (three) times daily.   guaiFENesin 600 MG 12 hr tablet Commonly known as:  MUCINEX Take 1 tablet (600 mg total) by mouth 2 (two) times daily as needed for cough or to loosen phlegm.   hydrALAZINE 50 MG tablet Commonly known as:  APRESOLINE Take 1 tablet (50 mg total) by mouth 3 (three) times daily.   levothyroxine 100 MCG tablet Commonly known as:  SYNTHROID, LEVOTHROID TAKE ONE TABLET BY MOUTH ONCE DAILY.   magnesium oxide 400 MG tablet Commonly known as:  MAG-OX Take 400 mg by mouth daily.   Melatonin 3 MG Tabs Take 1 tablet (3 mg total) by mouth at bedtime.   metoprolol succinate 25 MG 24 hr tablet Commonly known as:  TOPROL XL Take 1 tablet (25 mg total) by mouth daily.   NASACORT ALLERGY 24HR 55 MCG/ACT Aero nasal inhaler Generic drug:  triamcinolone SPRAY 2 SPRAYS INTO EACH NOSTRIL DAILY.  pantoprazole 40 MG tablet Commonly known as:  PROTONIX TAKE (1) TABLET BY MOUTH TWICE DAILY 30-60 MINUTES BEFORE BREAKFAST AND DINNER.   polyethylene glycol packet Commonly known as:  MIRALAX / GLYCOLAX Take 17 g by mouth daily.   senna-docusate 8.6-50 MG tablet Commonly known as:  Senokot-S Take 1-2 tablets, IF NEEDED, every 12 hrs for constipation   traMADol 50 MG tablet Commonly known as:  ULTRAM TAKE (1) TABLET BY MOUTH (3) TIMES DAILY.   vitamin B-12 1000 MCG tablet Commonly known as:  CYANOCOBALAMIN TAKE ONE TABLET BY MOUTH ONCE DAILY.       Allergies:  Allergies  Allergen Reactions  . Allegra Allergy [Fexofenadine  Hcl] Anaphylaxis  . Amoxicillin   . Penicillins   . Sulfa Antibiotics     Past Medical History, Surgical history, Social history, and Family History were reviewed and updated.  Review of Systems: All other 10 point review of systems is negative.   Physical Exam:  weight is 125 lb (56.7 kg). Her oral temperature is 98.3 F (36.8 C). Her blood pressure is 171/45 (abnormal) and her pulse is 57 (abnormal). Her respiration is 18 and oxygen saturation is 98%.   Wt Readings from Last 3 Encounters:  02/07/17 125 lb (56.7 kg)  11/29/16 124 lb (56.2 kg)  09/28/16 120 lb (54.4 kg)    Ocular: Sclerae unicteric, pupils equal, round and reactive to light Ear-nose-throat: Oropharynx clear, dentition fair Lymphatic: No cervical, supraclavicular or axillary adenopathy Lungs no rales or rhonchi, good excursion bilaterally Heart regular rate and rhythm, no murmur appreciated Abd soft, nontender, positive bowel sounds, no liver or spleen tip palpated on exam, no fluid wave  MSK no focal spinal tenderness, no joint edema Neuro: non-focal, well-oriented, appropriate affect Breasts: Deferred   Lab Results  Component Value Date   WBC 3.0 (L) 11/29/2016   HGB 9.0 (L) 11/29/2016   HCT 29.7 (L) 02/07/2017   MCV 99.7 02/07/2017   PLT 293 Few Large & giant platelets 11/29/2016   Lab Results  Component Value Date   FERRITIN 64 11/29/2016   IRON 75 11/29/2016   TIBC 246 11/29/2016   UIBC 171 11/29/2016   IRONPCTSAT 30 11/29/2016   Lab Results  Component Value Date   RETICCTPCT 1.7 12/13/2014   RBC 2.98 (L) 02/07/2017   RETICCTABS 60.5 12/13/2014   No results found for: KPAFRELGTCHN, LAMBDASER, KAPLAMBRATIO No results found for: IGGSERUM, IGA, IGMSERUM No results found for: Kathrynn Ducking, MSPIKE, SPEI   Chemistry      Component Value Date/Time   NA 147 (H) 02/07/2017 1316   NA 141 11/29/2016 1314   K 4.3 02/07/2017 1316   K 3.9 11/29/2016  1314   CL 109 (H) 02/07/2017 1316   CL 106 11/29/2016 1314   CO2 24 02/07/2017 1316   CO2 27 11/29/2016 1314   BUN 19 02/07/2017 1316   BUN 28 (H) 11/29/2016 1314   CREATININE 1.90 (H) 02/07/2017 1316   CREATININE 2.1 (H) 11/29/2016 1314      Component Value Date/Time   CALCIUM 9.0 02/07/2017 1316   CALCIUM 10.5 (H) 11/29/2016 1314   ALKPHOS 58 02/07/2017 1316   ALKPHOS 63 11/29/2016 1314   AST 15 02/07/2017 1316   AST 22 11/29/2016 1314   ALT 15 02/07/2017 1316   ALT 19 11/29/2016 1314   BILITOT 0.4 02/07/2017 1316   BILITOT 0.40 11/29/2016 1314      Impression and Plan: Allison Page is a very  pleasant 84 yo caucasian female with anemia of iron deficiency anemia and anemia of renal insufficiency. She has no complaints at this time.  Hgb is 9.4 so we will give her Aranesp today.  We will see what her iron studies show and bring her back in later this week for infusion if need be.  She will contact our office with any questions or concerns. We can certainly see her sooner if need be.   Laverna Peace, NP 1/7/20193:04 PM

## 2017-02-07 NOTE — Patient Instructions (Signed)
Darbepoetin Alfa injection (Aranesp) What is this medicine? DARBEPOETIN ALFA (dar be POE e tin AL fa) helps your body make more red blood cells. It is used to treat anemia caused by chronic kidney failure and chemotherapy. This medicine may be used for other purposes; ask your health care provider or pharmacist if you have questions. COMMON BRAND NAME(S): Aranesp What should I tell my health care provider before I take this medicine? They need to know if you have any of these conditions: -blood clotting disorders or history of blood clots -cancer patient not on chemotherapy -cystic fibrosis -heart disease, such as angina, heart failure, or a history of a heart attack -hemoglobin level of 12 g/dL or greater -high blood pressure -low levels of folate, iron, or vitamin B12 -seizures -an unusual or allergic reaction to darbepoetin, erythropoietin, albumin, hamster proteins, latex, other medicines, foods, dyes, or preservatives -pregnant or trying to get pregnant -breast-feeding How should I use this medicine? This medicine is for injection into a vein or under the skin. It is usually given by a health care professional in a hospital or clinic setting. If you get this medicine at home, you will be taught how to prepare and give this medicine. Use exactly as directed. Take your medicine at regular intervals. Do not take your medicine more often than directed. It is important that you put your used needles and syringes in a special sharps container. Do not put them in a trash can. If you do not have a sharps container, call your pharmacist or healthcare provider to get one. A special MedGuide will be given to you by the pharmacist with each prescription and refill. Be sure to read this information carefully each time. Talk to your pediatrician regarding the use of this medicine in children. While this medicine may be used in children as young as 1 year for selected conditions, precautions do  apply. Overdosage: If you think you have taken too much of this medicine contact a poison control center or emergency room at once. NOTE: This medicine is only for you. Do not share this medicine with others. What if I miss a dose? If you miss a dose, take it as soon as you can. If it is almost time for your next dose, take only that dose. Do not take double or extra doses. What may interact with this medicine? Do not take this medicine with any of the following medications: -epoetin alfa This list may not describe all possible interactions. Give your health care provider a list of all the medicines, herbs, non-prescription drugs, or dietary supplements you use. Also tell them if you smoke, drink alcohol, or use illegal drugs. Some items may interact with your medicine. What should I watch for while using this medicine? Your condition will be monitored carefully while you are receiving this medicine. You may need blood work done while you are taking this medicine. What side effects may I notice from receiving this medicine? Side effects that you should report to your doctor or health care professional as soon as possible: -allergic reactions like skin rash, itching or hives, swelling of the face, lips, or tongue -breathing problems -changes in vision -chest pain -confusion, trouble speaking or understanding -feeling faint or lightheaded, falls -high blood pressure -muscle aches or pains -pain, swelling, warmth in the leg -rapid weight gain -severe headaches -sudden numbness or weakness of the face, arm or leg -trouble walking, dizziness, loss of balance or coordination -seizures (convulsions) -swelling of the ankles, feet,   hands -unusually weak or tired Side effects that usually do not require medical attention (report to your doctor or health care professional if they continue or are bothersome): -diarrhea -fever, chills (flu-like symptoms) -headaches -nausea, vomiting -redness,  stinging, or swelling at site where injected This list may not describe all possible side effects. Call your doctor for medical advice about side effects. You may report side effects to FDA at 1-800-FDA-1088. Where should I keep my medicine? Keep out of the reach of children. Store in a refrigerator between 2 and 8 degrees C (36 and 46 degrees F). Do not freeze. Do not shake. Throw away any unused portion if using a single-dose vial. Throw away any unused medicine after the expiration date. NOTE: This sheet is a summary. It may not cover all possible information. If you have questions about this medicine, talk to your doctor, pharmacist, or health care provider.  2018 Elsevier/Gold Standard (2015-09-08 19:52:26)  

## 2017-02-08 LAB — FERRITIN: Ferritin: 54 ng/mL (ref 9–269)

## 2017-02-08 LAB — IRON AND TIBC
IRON: 76 ug/dL (ref 41–142)
Saturation Ratios: 33 % (ref 21–57)
TIBC: 230 ug/dL — ABNORMAL LOW (ref 236–444)
UIBC: 155 ug/dL

## 2017-02-09 ENCOUNTER — Ambulatory Visit (INDEPENDENT_AMBULATORY_CARE_PROVIDER_SITE_OTHER): Payer: Medicare Other | Admitting: Internal Medicine

## 2017-02-09 ENCOUNTER — Encounter: Payer: Self-pay | Admitting: Internal Medicine

## 2017-02-09 VITALS — BP 128/82 | HR 60 | Temp 98.0°F | Ht 62.0 in

## 2017-02-09 DIAGNOSIS — M25512 Pain in left shoulder: Secondary | ICD-10-CM

## 2017-02-09 DIAGNOSIS — M545 Low back pain, unspecified: Secondary | ICD-10-CM | POA: Insufficient documentation

## 2017-02-09 DIAGNOSIS — I10 Essential (primary) hypertension: Secondary | ICD-10-CM | POA: Diagnosis not present

## 2017-02-09 DIAGNOSIS — Z794 Long term (current) use of insulin: Secondary | ICD-10-CM

## 2017-02-09 DIAGNOSIS — E559 Vitamin D deficiency, unspecified: Secondary | ICD-10-CM | POA: Insufficient documentation

## 2017-02-09 DIAGNOSIS — R21 Rash and other nonspecific skin eruption: Secondary | ICD-10-CM | POA: Diagnosis not present

## 2017-02-09 DIAGNOSIS — E1110 Type 2 diabetes mellitus with ketoacidosis without coma: Secondary | ICD-10-CM

## 2017-02-09 DIAGNOSIS — G8929 Other chronic pain: Secondary | ICD-10-CM

## 2017-02-09 DIAGNOSIS — M25511 Pain in right shoulder: Secondary | ICD-10-CM | POA: Diagnosis not present

## 2017-02-09 MED ORDER — TRIAMCINOLONE ACETONIDE 0.1 % EX CREA: 1.0000 "application " | TOPICAL_CREAM | Freq: Two times a day (BID) | CUTANEOUS | 1 refills | Status: DC | PRN

## 2017-02-09 NOTE — Progress Notes (Signed)
Subjective:    Patient ID: Allison Page, female    DOB: [birth year], 84 y.o.   MRN: [id number]  HPI  Here with daughter, c/o ongoing arthritic pain and "pain all over" dialy that really has her slowed down, just too uncomfortable to even move much room to room, worse to lower back, right shoulder resulting in worsening gait and risk of fall.  Pt continues to have recurring LBP without change in severity, bowel or bladder change, fever, wt loss,  worsening LE pain/numbness/weakness, gait change or falls. Pt denies chest pain, increased sob or doe, wheezing, orthopnea, PND, increased LE swelling, palpitations, dizziness or syncope.  Does have hx of Vit D defic, not currently taking Vit D.  Also has 3 areas of skin scaly areas with marked itching to right mid lateral leg, and 2 smaller areas to the left deltoid area.   Pt denies polydipsia, polyuria, or low sugar symptoms such as weakness or confusion improved with po intake.  Pt states overall good compliance with meds, trying to follow lower cholesterol, diabetic diet, wt overall stable but little exercise however.     Declines Tdap for now.   Wt Readings from Last 3 Encounters:  02/07/17 125 lb (56.7 kg)  11/29/16 124 lb (56.2 kg)  09/28/16 120 lb (54.4 kg)  Was getting PT now finished with low back pain, right shoulder pain, but heading worse again.  Pt is S/p Vit D 12 wk therapy.  Pt denies fever, wt loss, night sweats, loss of appetite, or other constitutional symptoms Past Medical History:  Diagnosis Date  . Acid reflux   . Anemia of chronic renal failure, stage 3 (moderate) ([place]) 03/21/2014  . Arthritis   . Dementia 11/19/2014  . Diabetes ([place])   . High blood pressure   . High cholesterol   . Hypothyroidism   . Iron deficiency anemia 03/21/2014  . TIA (transient ischemic attack)    Past Surgical History:  Procedure Laterality Date  . ANTERIOR (CYSTOCELE) AND POSTERIOR REPAIR (RECTOCELE) WITH XENFORM GRAFT AND SACROSPINOUS FIXATION     . BLADDER SUSPENSION    . BREAST BIOPSY    . CATARACT EXTRACTION    . CHOLECYSTECTOMY  2013  . COLONOSCOPY  05/02/2007  . ERCP  2014  . TOTAL HIP ARTHROPLASTY Left 05/06/2008  . VAGINAL HYSTERECTOMY      reports that she quit smoking about 46 years ago. Her smoking use included cigarettes. She has a 25.00 pack-year smoking history. she has never used smokeless tobacco. She reports that she does not drink alcohol or use drugs. family history includes Arthritis in her mother; Cancer in her sister; Colon cancer in her son; Esophageal cancer in her daughter; Heart attack in her father; Seizures in her daughter. Allergies  Allergen Reactions  . Allegra Allergy [Fexofenadine Hcl] Anaphylaxis  . Amoxicillin   . Penicillins   . Sulfa Antibiotics    Current Outpatient Medications on File Prior to Visit  Medication Sig Dispense Refill  . acetaminophen (TYLENOL) 500 MG tablet Take 1 tablet (500 mg total) by mouth every 6 (six) hours as needed. 270 tablet 2  . aluminum-magnesium hydroxide-simethicone (MAALOX) 606-301-60 MG/5ML SUSP 2 teaspoons by mouth 3 times daily as needed for heartburn    . amLODipine (NORVASC) 10 MG tablet TAKE (1/2) TABLET BY MOUTH ONCE DAILY. 15 tablet 5  . aspirin 81 MG tablet TAKE ONE TABLET BY MOUTH ONCE DAILY. 30 tablet 11  . atorvastatin (LIPITOR) 20 MG tablet TAKE (1) TABLET  BY MOUTH AT BEDTIME. 30 tablet 5  . clindamycin (CLEOCIN) 150 MG capsule 4 by mouth 1 hour prior to dental procedures 4 capsule 0  . conjugated estrogens (PREMARIN) vaginal cream Place 1 Applicatorful vaginally daily. 42.5 g 12  . darifenacin (ENABLEX) 7.5 MG 24 hr tablet Take 1 tablet (7.5 mg total) by mouth daily. 90 tablet 3  . donepezil (ARICEPT) 10 MG tablet TAKE ONE TABLET BY MOUTH ONCE DAILY. 30 tablet 5  . gabapentin (NEURONTIN) 100 MG capsule Take 1 capsule (100 mg total) by mouth 3 (three) times daily. 90 capsule 5  . guaiFENesin (MUCINEX) 600 MG 12 hr tablet Take 1 tablet (600 mg total)  by mouth 2 (two) times daily as needed for cough or to loosen phlegm. 60 tablet 1  . hydrALAZINE (APRESOLINE) 50 MG tablet Take 1 tablet (50 mg total) by mouth 3 (three) times daily. 270 tablet 3  . levothyroxine (SYNTHROID, LEVOTHROID) 100 MCG tablet TAKE ONE TABLET BY MOUTH ONCE DAILY. 30 tablet 2  . magnesium oxide (MAG-OX) 400 MG tablet Take 400 mg by mouth daily.    . Melatonin 3 MG TABS Take 1 tablet (3 mg total) by mouth at bedtime. 30 tablet 11  . metoprolol succinate (TOPROL XL) 25 MG 24 hr tablet Take 1 tablet (25 mg total) by mouth daily. 90 tablet 3  . NASACORT ALLERGY 24HR 55 MCG/ACT AERO nasal inhaler SPRAY 2 SPRAYS INTO EACH NOSTRIL DAILY. 10.8 mL 5  . pantoprazole (PROTONIX) 40 MG tablet TAKE (1) TABLET BY MOUTH TWICE DAILY 30-60 MINUTES BEFORE BREAKFAST AND DINNER. 60 tablet 5  . polyethylene glycol (MIRALAX / GLYCOLAX) packet Take 17 g by mouth daily. 90 each 3  . senna-docusate (SENOKOT-S) 8.6-50 MG per tablet Take 1-2 tablets, IF NEEDED, every 12 hrs for constipation 60 tablet 4  . traMADol (ULTRAM) 50 MG tablet TAKE (1) TABLET BY MOUTH (3) TIMES DAILY. 270 tablet 1  . vitamin B-12 (CYANOCOBALAMIN) 1000 MCG tablet TAKE ONE TABLET BY MOUTH ONCE DAILY. 30 tablet 0   No current facility-administered medications on file prior to visit.     Review of Systems  Constitutional: Negative for other unusual diaphoresis or sweats HENT: Negative for ear discharge or swelling Eyes: Negative for other worsening visual disturbances Respiratory: Negative for stridor or other swelling  Gastrointestinal: Negative for worsening distension or other blood Genitourinary: Negative for retention or other urinary change Musculoskeletal: Negative for other MSK pain or swelling Skin: Negative for color change or other new lesions Neurological: Negative for worsening tremors and other numbness  Psychiatric/Behavioral: Negative for worsening agitation or other fatigue All other system neg per pt      Objective:   Physical Exam BP 128/82   Pulse 60   Temp 98 F (36.7 C) (Oral)   Ht 5\' 2"  (1.575 m)   SpO2 98%   BMI 22.86 kg/m  VS noted,  Constitutional: Pt appears in NAD HENT: Head: NCAT.  Right Ear: External ear normal.  Left Ear: External ear normal.  Eyes: . Pupils are equal, round, and reactive to light. Conjunctivae and EOM are normal Nose: without d/c or deformity Neck: Neck supple. Gross normal ROM Cardiovascular: Normal rate and regular rhythm.   Pulmonary/Chest: Effort normal and breath sounds without rales or wheezing.  Abd:  Soft, NT, ND, + BS, no organomegaly Right shoulder with reduced ROM and pain to forward elevation Spine nontender throughout except for left lumbar paravertebral tender without swelling or rash Neurological: Pt is alert.  At baseline orientation, motor grossly intact Skin: Skin is warm. + scaly nontender rashes - 1 cm to right mid lateral leg, 2 smaller to left deltoid area, other new lesions Psychiatric: Pt behavior is normal without agitation  Trace to 1+ bilat le edema right > left No other exam findings    Assessment & Plan:

## 2017-02-09 NOTE — Patient Instructions (Addendum)
Please take all new medication as prescribed - the steroid cream as needed  Please take all new medication as prescribed - the Tylenol 650 mg twice per day, and Vitamiin D3 2000 units per day  You will be contacted regarding the referral for: Physical Therapy at the [facility]  Please continue all other medications as before, and refills have been done if requested.  Please have the pharmacy call with any other refills you may need.  Please continue your efforts at being more active, low cholesterol diet, and weight control.  You are otherwise up to date with prevention measures today.  Please keep your appointments with your specialists as you may have planned  Please return in 6 months, or sooner if needed

## 2017-02-11 ENCOUNTER — Encounter: Payer: Self-pay | Admitting: Internal Medicine

## 2017-02-11 NOTE — Assessment & Plan Note (Signed)
Worse on the right, delcines films or sport med referral, for tylenol ES 650 bid

## 2017-02-11 NOTE — Assessment & Plan Note (Signed)
For triam cr prn,  to f/u any worsening symptoms or concerns 

## 2017-02-11 NOTE — Assessment & Plan Note (Signed)
stable overall by history and exam, recent data reviewed with pt, and pt to continue medical treatment as before,  to f/u any worsening symptoms or concerns BP Readings from Last 3 Encounters:  02/09/17 128/82  02/07/17 (!) 171/45  11/29/16 (!) 149/43

## 2017-02-11 NOTE — Assessment & Plan Note (Addendum)
Pt to start Vit D 2000 units per day  Note:  Total time for pt hx, exam, review of record with pt in the room, determination of diagnoses and plan for further eval and tx is > 40 min, with over 50% spent in coordination and counseling of patient including the differential dx, tx, further evaluation and other management of vit d deficiency, right shoulder pain, lower back pain, rash, DM and HTN

## 2017-02-11 NOTE — Assessment & Plan Note (Signed)
stable overall by history and exam, recent data reviewed with pt, and pt to continue medical treatment as before,  to f/u any worsening symptoms or concerns Lab Results  Component Value Date   HGBA1C 6.2 09/28/2016

## 2017-02-11 NOTE — Assessment & Plan Note (Signed)
Suspect underlying lumbar djd/ddd -  For tylenol ES 650 bid, restart PT

## 2017-02-14 ENCOUNTER — Encounter: Payer: Self-pay | Admitting: Internal Medicine

## 2017-02-14 NOTE — Telephone Encounter (Signed)
shirron - Done hardcopy to [employer]

## 2017-02-28 DIAGNOSIS — M1991 Primary osteoarthritis, unspecified site: Secondary | ICD-10-CM

## 2017-02-28 DIAGNOSIS — M25511 Pain in right shoulder: Secondary | ICD-10-CM | POA: Diagnosis not present

## 2017-02-28 DIAGNOSIS — M545 Low back pain: Secondary | ICD-10-CM | POA: Diagnosis not present

## 2017-02-28 DIAGNOSIS — E559 Vitamin D deficiency, unspecified: Secondary | ICD-10-CM | POA: Diagnosis not present

## 2017-03-10 ENCOUNTER — Ambulatory Visit: Payer: Medicare Other | Admitting: Family

## 2017-03-10 ENCOUNTER — Other Ambulatory Visit: Payer: Medicare Other

## 2017-03-10 ENCOUNTER — Ambulatory Visit: Payer: Medicare Other

## 2017-03-14 ENCOUNTER — Encounter: Payer: Self-pay | Admitting: Internal Medicine

## 2017-03-14 ENCOUNTER — Inpatient Hospital Stay: Payer: Medicare Other

## 2017-03-14 ENCOUNTER — Other Ambulatory Visit: Payer: Self-pay

## 2017-03-14 ENCOUNTER — Encounter: Payer: Self-pay | Admitting: Family

## 2017-03-14 ENCOUNTER — Inpatient Hospital Stay (HOSPITAL_BASED_OUTPATIENT_CLINIC_OR_DEPARTMENT_OTHER): Payer: Medicare Other | Admitting: Family

## 2017-03-14 ENCOUNTER — Inpatient Hospital Stay: Payer: Medicare Other | Attending: Hematology & Oncology

## 2017-03-14 VITALS — BP 164/49 | HR 52 | Temp 97.7°F | Resp 17 | Wt 122.0 lb

## 2017-03-14 DIAGNOSIS — D509 Iron deficiency anemia, unspecified: Secondary | ICD-10-CM | POA: Insufficient documentation

## 2017-03-14 DIAGNOSIS — N183 Chronic kidney disease, stage 3 unspecified: Secondary | ICD-10-CM

## 2017-03-14 DIAGNOSIS — D631 Anemia in chronic kidney disease: Secondary | ICD-10-CM | POA: Insufficient documentation

## 2017-03-14 DIAGNOSIS — Z79899 Other long term (current) drug therapy: Secondary | ICD-10-CM | POA: Insufficient documentation

## 2017-03-14 DIAGNOSIS — D500 Iron deficiency anemia secondary to blood loss (chronic): Secondary | ICD-10-CM

## 2017-03-14 DIAGNOSIS — Z7982 Long term (current) use of aspirin: Secondary | ICD-10-CM | POA: Diagnosis not present

## 2017-03-14 DIAGNOSIS — N189 Chronic kidney disease, unspecified: Secondary | ICD-10-CM | POA: Diagnosis not present

## 2017-03-14 LAB — CMP (CANCER CENTER ONLY)
ALK PHOS: 45 U/L (ref 26–84)
ALT: 12 U/L (ref 0–55)
ANION GAP: 12 (ref 5–15)
AST: 18 U/L (ref 5–34)
Albumin: 3.6 g/dL (ref 3.5–5.0)
BILIRUBIN TOTAL: 0.5 mg/dL (ref 0.2–1.2)
BUN: 34 mg/dL — ABNORMAL HIGH (ref 7–22)
CO2: 25 mmol/L (ref 18–33)
Calcium: 10.4 mg/dL — ABNORMAL HIGH (ref 8.0–10.3)
Chloride: 110 mmol/L — ABNORMAL HIGH (ref 98–108)
Creatinine: 2 mg/dL — ABNORMAL HIGH (ref 0.60–1.10)
Glucose, Bld: 122 mg/dL — ABNORMAL HIGH (ref 73–118)
Potassium: 4.4 mmol/L (ref 3.3–4.7)
Sodium: 147 mmol/L — ABNORMAL HIGH (ref 128–145)
TOTAL PROTEIN: 7.8 g/dL (ref 6.4–8.1)

## 2017-03-14 LAB — CBC WITH DIFFERENTIAL (CANCER CENTER ONLY)
BASOS PCT: 0 %
BLASTS: 0 %
Band Neutrophils: 6 %
Basophils Absolute: 0 10*3/uL (ref 0.0–0.1)
Eosinophils Absolute: 0 10*3/uL (ref 0.0–0.5)
Eosinophils Relative: 1 %
HEMATOCRIT: 33.2 % — AB (ref 34.8–46.6)
HEMOGLOBIN: 10.7 g/dL — AB (ref 11.6–15.9)
LYMPHS PCT: 32 %
Lymphs Abs: 1 10*3/uL (ref 0.9–3.3)
MCH: 32 pg (ref 26.0–34.0)
MCHC: 32.2 g/dL (ref 32.0–36.0)
MCV: 99.4 fL (ref 81.0–101.0)
Metamyelocytes Relative: 0 %
Monocytes Absolute: 0.2 10*3/uL (ref 0.1–0.9)
Monocytes Relative: 6 %
Myelocytes: 0 %
NEUTROS PCT: 55 %
NRBC: 0 /100{WBCs}
Neutro Abs: 1.9 10*3/uL (ref 1.5–6.5)
OTHER: 0 %
PROMYELOCYTES ABS: 0 %
Platelet Count: 313 10*3/uL (ref 145–400)
RBC: 3.34 MIL/uL — AB (ref 3.70–5.32)
RDW: 15 % (ref 11.1–15.7)
WBC: 3.1 10*3/uL — AB (ref 3.9–10.0)

## 2017-03-14 LAB — RETICULOCYTES
RBC.: 3.32 MIL/uL — ABNORMAL LOW (ref 3.70–5.45)
RETIC CT PCT: 1 % (ref 0.7–2.1)
Retic Count, Absolute: 33.2 10*3/uL — ABNORMAL LOW (ref 33.7–90.7)

## 2017-03-14 MED ORDER — OSELTAMIVIR PHOSPHATE 75 MG PO CAPS: 75.0000 mg | ORAL_CAPSULE | Freq: Two times a day (BID) | ORAL | 0 refills | Status: DC

## 2017-03-14 NOTE — Telephone Encounter (Signed)
tamiflu sent to [facility]

## 2017-03-14 NOTE — Progress Notes (Signed)
Hematology and Oncology Follow Up Visit  Allison Page [id number] [birth year] 84 y.o. 03/14/2017   Principle Diagnosis:  Anemia of renal insufficiency Iron deficiency anemia  Current Therapy:   IV iron as indicated Aranesp 300 g subcutaneous for hemoglobin less than 10 - last dose given on October 2018   Interim History:  Allison Page is here today for her daughter for follow-up. She is doing well and has no complaints at this time. Hgb is 10.7 so no Aranesp needed this visit.  No episodes of bleeding, no bruising or petechiae. No lymphadenopathy found on exam.  No fever, chills, n/v, cough, rash, dizziness, SOB, chest pain, palpitations, abdominal pain or changes in bowel or bladder habits.  No swelling, numbness or tingling in her extremities. She has generalized aches and pains that wax and wane.  She has maintained a good appetite and is staying well hydrated. Her weight is stable.   ECOG Performance Status: 0 - Asymptomatic  Medications:  Allergies as of 03/14/2017      Reactions   Allegra Allergy [fexofenadine Hcl] Anaphylaxis   Amoxicillin    Penicillins    Sulfa Antibiotics       Medication List        Accurate as of 03/14/17  1:34 PM. Always use your most recent med list.          acetaminophen 500 MG tablet Commonly known as:  TYLENOL Take 1 tablet (500 mg total) by mouth every 6 (six) hours as needed.   aluminum-magnesium hydroxide-simethicone 500-938-18 MG/5ML Susp Commonly known as:  MAALOX 2 teaspoons by mouth 3 times daily as needed for heartburn   amLODipine 10 MG tablet Commonly known as:  NORVASC TAKE (1/2) TABLET BY MOUTH ONCE DAILY.   aspirin 81 MG tablet TAKE ONE TABLET BY MOUTH ONCE DAILY.   atorvastatin 20 MG tablet Commonly known as:  LIPITOR TAKE (1) TABLET BY MOUTH AT BEDTIME.   clindamycin 150 MG capsule Commonly known as:  CLEOCIN 4 by mouth 1 hour prior to dental procedures   conjugated estrogens vaginal cream Commonly  known as:  PREMARIN Place 1 Applicatorful vaginally daily.   darifenacin 7.5 MG 24 hr tablet Commonly known as:  ENABLEX Take 1 tablet (7.5 mg total) by mouth daily.   donepezil 10 MG tablet Commonly known as:  ARICEPT TAKE ONE TABLET BY MOUTH ONCE DAILY.   gabapentin 100 MG capsule Commonly known as:  NEURONTIN Take 1 capsule (100 mg total) by mouth 3 (three) times daily.   guaiFENesin 600 MG 12 hr tablet Commonly known as:  MUCINEX Take 1 tablet (600 mg total) by mouth 2 (two) times daily as needed for cough or to loosen phlegm.   hydrALAZINE 50 MG tablet Commonly known as:  APRESOLINE Take 1 tablet (50 mg total) by mouth 3 (three) times daily.   levothyroxine 100 MCG tablet Commonly known as:  SYNTHROID, LEVOTHROID TAKE ONE TABLET BY MOUTH ONCE DAILY.   magnesium oxide 400 MG tablet Commonly known as:  MAG-OX Take 400 mg by mouth daily.   Melatonin 3 MG Tabs Take 1 tablet (3 mg total) by mouth at bedtime.   metoprolol succinate 25 MG 24 hr tablet Commonly known as:  TOPROL XL Take 1 tablet (25 mg total) by mouth daily.   NASACORT ALLERGY 24HR 55 MCG/ACT Aero nasal inhaler Generic drug:  triamcinolone SPRAY 2 SPRAYS INTO EACH NOSTRIL DAILY.   oseltamivir 75 MG capsule Commonly known as:  TAMIFLU Take 1 capsule (75 mg  total) by mouth 2 (two) times daily.   pantoprazole 40 MG tablet Commonly known as:  PROTONIX TAKE (1) TABLET BY MOUTH TWICE DAILY 30-60 MINUTES BEFORE BREAKFAST AND DINNER.   polyethylene glycol packet Commonly known as:  MIRALAX / GLYCOLAX Take 17 g by mouth daily.   senna-docusate 8.6-50 MG tablet Commonly known as:  Senokot-S Take 1-2 tablets, IF NEEDED, every 12 hrs for constipation   traMADol 50 MG tablet Commonly known as:  ULTRAM TAKE (1) TABLET BY MOUTH (3) TIMES DAILY.   triamcinolone cream 0.1 % Commonly known as:  KENALOG Apply 1 application topically 2 (two) times daily as needed.   vitamin B-12 1000 MCG tablet Commonly  known as:  CYANOCOBALAMIN TAKE ONE TABLET BY MOUTH ONCE DAILY.       Allergies:  Allergies  Allergen Reactions  . Allegra Allergy [Fexofenadine Hcl] Anaphylaxis  . Amoxicillin   . Penicillins   . Sulfa Antibiotics     Past Medical History, Surgical history, Social history, and Family History were reviewed and updated.  Review of Systems: All other 10 point review of systems is negative.   Physical Exam:  weight is 122 lb (55.3 kg). Her oral temperature is 97.7 F (36.5 C). Her blood pressure is 164/49 (abnormal) and her pulse is 52 (abnormal). Her respiration is 17 and oxygen saturation is 99%.   Wt Readings from Last 3 Encounters:  03/14/17 122 lb (55.3 kg)  02/07/17 125 lb (56.7 kg)  11/29/16 124 lb (56.2 kg)    Ocular: Sclerae unicteric, pupils equal, round and reactive to light Ear-nose-throat: Oropharynx clear, dentition fair Lymphatic: No cervical, supraclavicular or axillary adenopathy Lungs no rales or rhonchi, good excursion bilaterally Heart regular rate and rhythm, no murmur appreciated Abd soft, nontender, positive bowel sounds, no liver or spleen tip palpated on exam, no fluid wave  MSK no focal spinal tenderness, no joint edema Neuro: non-focal, well-oriented, appropriate affect Breasts: Deferred   Lab Results  Component Value Date   WBC 3.1 (L) 03/14/2017   HGB 9.0 (L) 11/29/2016   HCT 33.2 (L) 03/14/2017   MCV 99.4 03/14/2017   PLT 313 03/14/2017   Lab Results  Component Value Date   FERRITIN 54 02/07/2017   IRON 76 02/07/2017   TIBC 230 (L) 02/07/2017   UIBC 155 02/07/2017   IRONPCTSAT 33 02/07/2017   Lab Results  Component Value Date   RETICCTPCT 1.5 02/07/2017   RBC 3.34 (L) 03/14/2017   RETICCTABS 60.5 12/13/2014   No results found for: KPAFRELGTCHN, LAMBDASER, KAPLAMBRATIO No results found for: IGGSERUM, IGA, IGMSERUM No results found for: Ronnald Ramp, A1GS, A2GS, BETS, BETA2SER, GAMS, MSPIKE, SPEI   Chemistry        Component Value Date/Time   NA 147 (H) 03/14/2017 1241   NA 141 11/29/2016 1314   K 4.4 03/14/2017 1241   K 3.9 11/29/2016 1314   CL 110 (H) 03/14/2017 1241   CL 106 11/29/2016 1314   CO2 25 03/14/2017 1241   CO2 27 11/29/2016 1314   BUN 34 (H) 03/14/2017 1241   BUN 28 (H) 11/29/2016 1314   CREATININE 2.00 (H) 03/14/2017 1241   CREATININE 2.1 (H) 11/29/2016 1314      Component Value Date/Time   CALCIUM 10.4 (H) 03/14/2017 1241   CALCIUM 10.5 (H) 11/29/2016 1314   ALKPHOS 45 03/14/2017 1241   ALKPHOS 63 11/29/2016 1314   AST 18 03/14/2017 1241   ALT 12 03/14/2017 1241   ALT 19 11/29/2016 1314  BILITOT 0.5 03/14/2017 1241      Impression and Plan: Allison Page is a very pleasant 84 yo caucasian female with iron deficiency anemia and anemia of chronic renal insufficiency. She continues to do well and has no complaints.  Hgb is stable at 10.7. No Aranesp needed this visit.  We will see what her iron studies show and bring her back in for an infusion if needed.  She will contact our office with any questions or concerns. We can certainly see her sooner if need be.   Laverna Peace, NP 2/11/20191:34 PM

## 2017-03-15 LAB — IRON AND TIBC
Iron: 100 ug/dL (ref 41–142)
SATURATION RATIOS: 34 % (ref 21–57)
TIBC: 293 ug/dL (ref 236–444)
UIBC: 193 ug/dL

## 2017-03-15 LAB — FERRITIN: Ferritin: 73 ng/mL (ref 9–269)

## 2017-03-21 IMAGING — MR MR HEAD W/O CM
10 series · 48 of 48 positions shown · non-contrast
Comparison: None.

CLINICAL DATA: Memory loss and confusion. Symptoms for 8 months and
worsening.

EXAM:
MRI HEAD WITHOUT CONTRAST
TECHNIQUE: Multiplanar, multiecho pulse sequences of the brain and surrounding
structures were obtained without intravenous contrast.

[Series 2: T1 · sagittal · 5.0mm · 0.45mm/px · 3 of 21 slices shown]
[im 1/21]
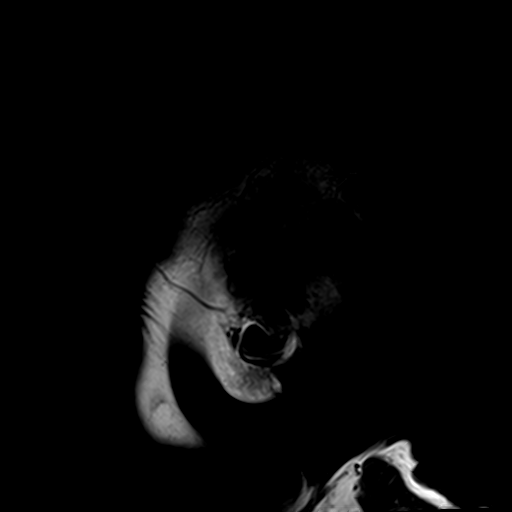
[im 11/21]
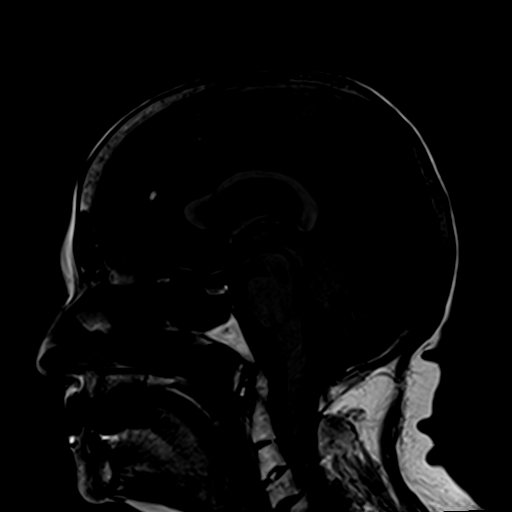
[im 21/21]
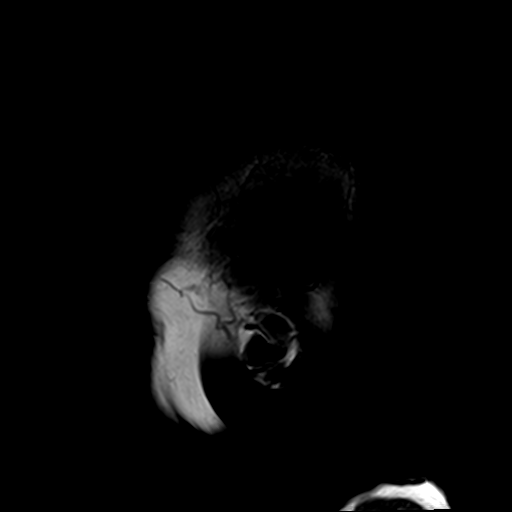

[Series 3: DWI · axial · 3.0mm · 1.80mm/px · z∈[-19,+116]mm · 10 of 92 slices shown (1 of 4)]
[im 1/92]
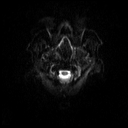
[im 11/92]
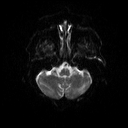
[im 21/92]
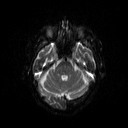
[im 31/92]
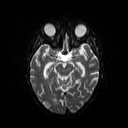
[im 41/92]
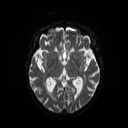
[im 51/92]
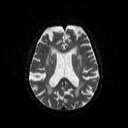
[im 61/92]
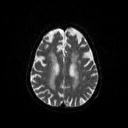
[im 71/92]
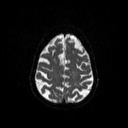
[im 81/92]
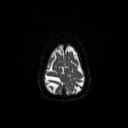
[im 92/92]
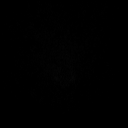

[Series 4: DWI · axial · 3.0mm · 1.80mm/px · z∈[-19,+116]mm · 5 of 47 slices shown (2 of 4)]
[im 1/47]
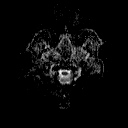
[im 12/47]
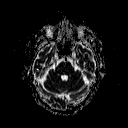
[im 24/47]
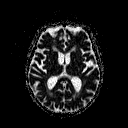
[im 35/47]
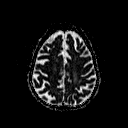
[im 47/47]
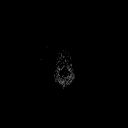

[Series 5: DWI · coronal · 5.0mm · 1.80mm/px · 6 of 62 slices shown (3 of 4)]
[im 1/62]
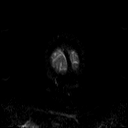
[im 13/62]
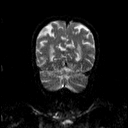
[im 25/62]
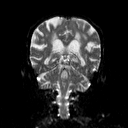
[im 37/62]
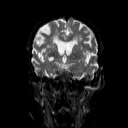
[im 49/62]
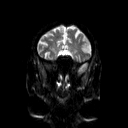
[im 62/62]
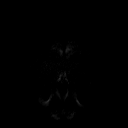

[Series 6: DWI · coronal · 5.0mm · 1.80mm/px · 3 of 31 slices shown (4 of 4)]
[im 1/31]
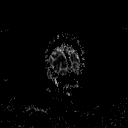
[im 16/31]
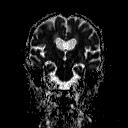
[im 31/31]
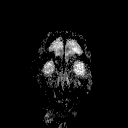

[Series 8: swi_images · axial · 2.0mm · 0.90mm/px · z∈[-24,+114]mm · 7 of 72 slices shown]
[im 1/72]
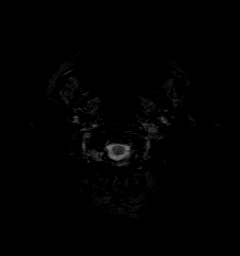
[im 12/72]
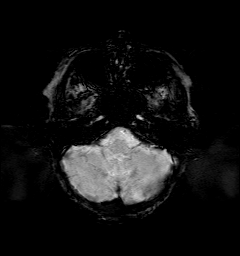
[im 24/72]
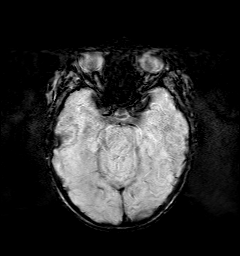
[im 36/72]
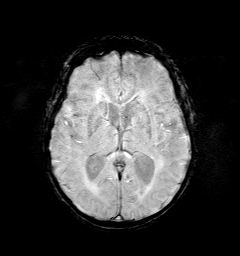
[im 48/72]
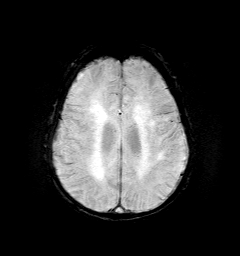
[im 60/72]
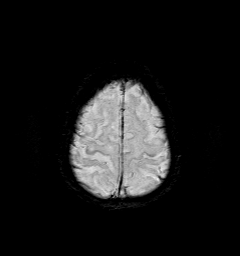
[im 72/72]
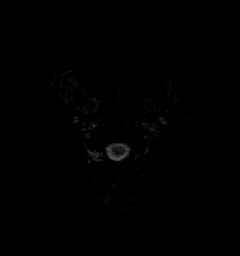

[Series 9: T2 · axial · 5.0mm · 0.51mm/px · z∈[-25,+114]mm · 2 of 22 slices shown (1 of 2)]
[im 1/22]
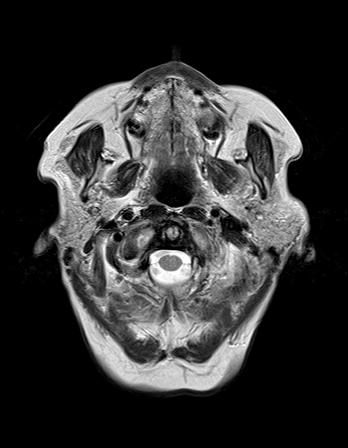
[im 22/22]
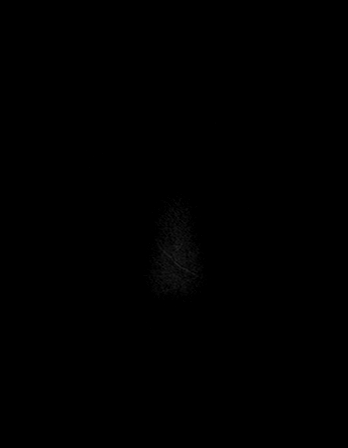

[Series 10: FLAIR · axial · 5.0mm · 0.45mm/px · z∈[-24,+114]mm · 2 of 22 slices shown]
[im 1/22]
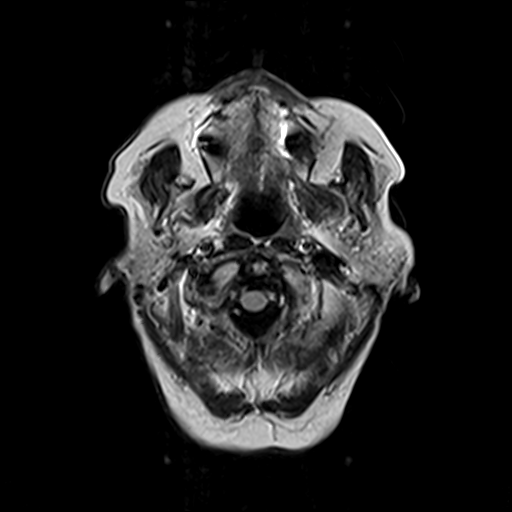
[im 22/22]
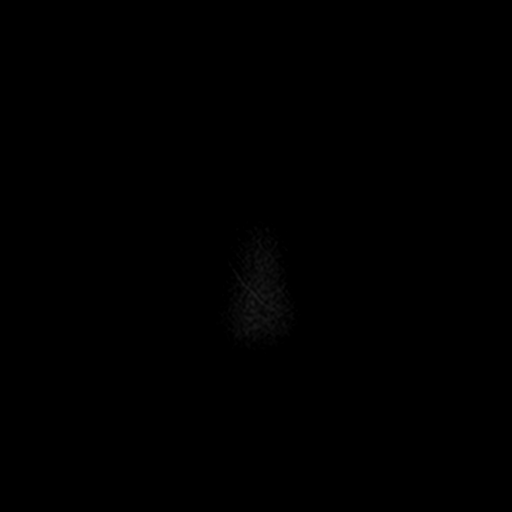

[Series 11: t1_mpr_tra · axial · 2.0mm · 0.45mm/px · z∈[-25,+114]mm · 7 of 72 slices shown]
[im 1/72]
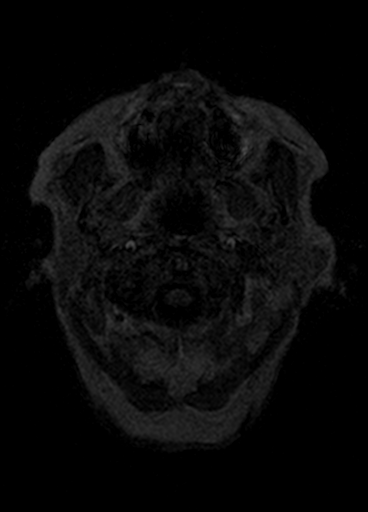
[im 12/72]
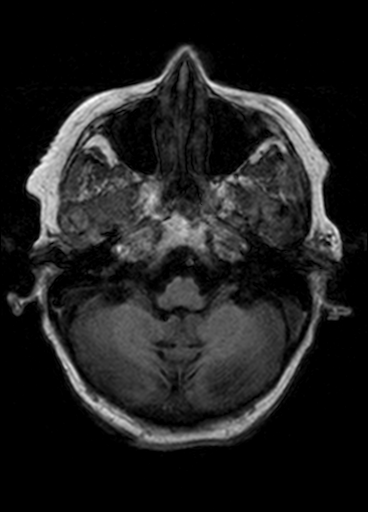
[im 24/72]
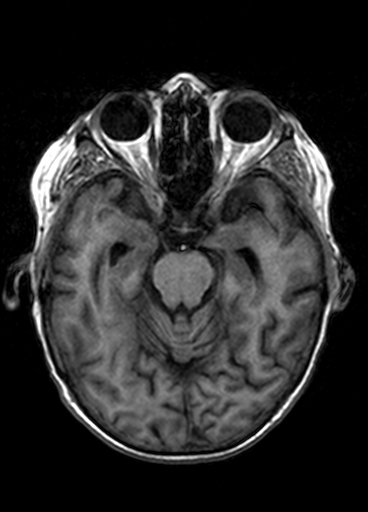
[im 36/72]
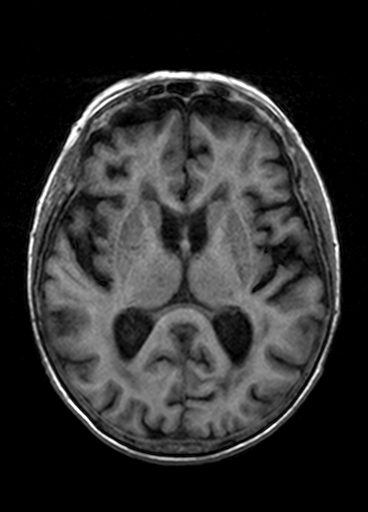
[im 48/72]
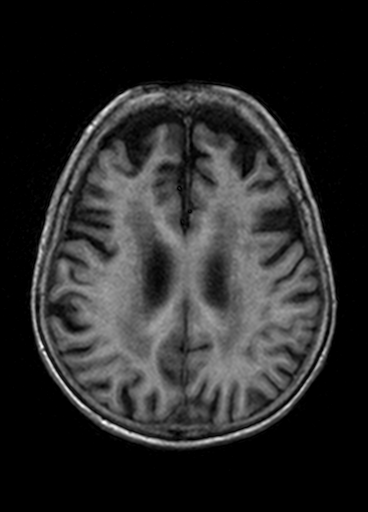
[im 60/72]
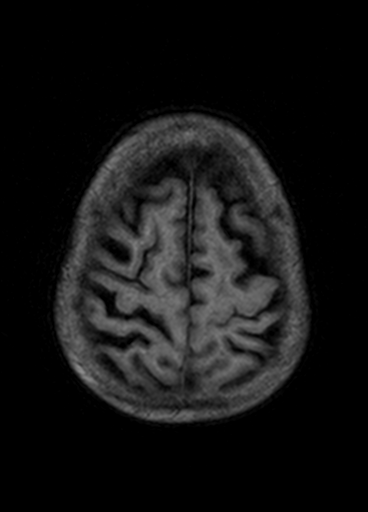
[im 72/72]
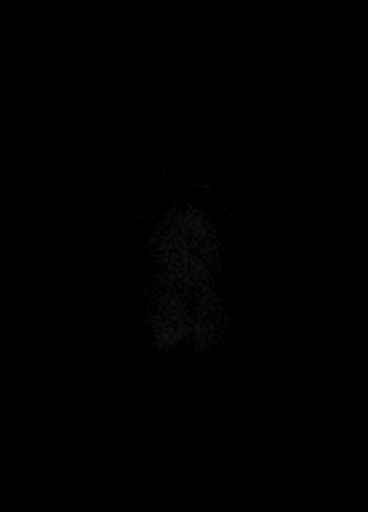

[Series 12: T2 · coronal · 5.0mm · 0.45mm/px · 3 of 25 slices shown (2 of 2)]
[im 1/25]
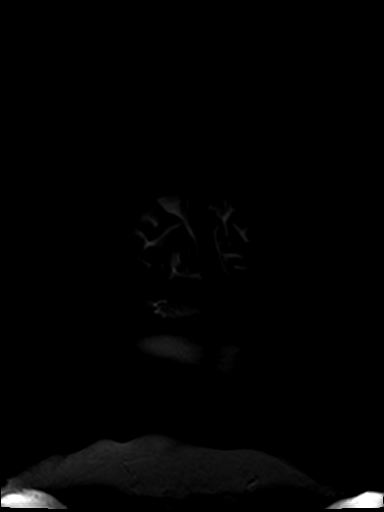
[im 13/25]
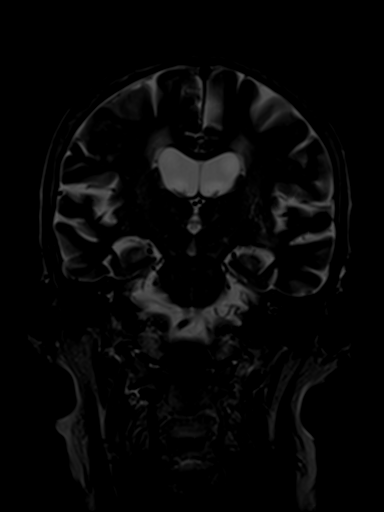
[im 25/25]
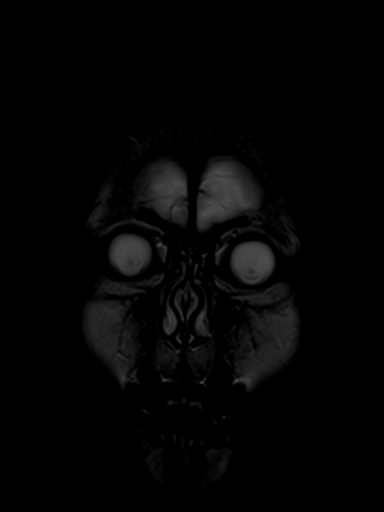

[48 of 48 positions shown; findings below may reference images not displayed]

FINDINGS: No evidence for acute infarction, hemorrhage, mass lesion, or
extra-axial fluid.

Generalized atrophy. Hydrocephalus ex vacuo. Extensive chronic
microvascular ischemic change. Scattered chronic lacunar infarcts.
Normal pituitary and cerebellar tonsils.

Cervical spondylosis with central extrusion at C4-5 and degenerative
anterolisthesis, possibly resulting in cord compression. Recommend
cervical spine MRI as clinically indicated.

BILATERAL cataract extraction. Extracranial soft tissues
unremarkable.
IMPRESSION: Chronic changes as described.  No acute intracranial findings.

Moderately extensive atrophy and small vessel disease.

Cervical spondylosis. Consider further evaluation for cord
compression if there are signs and symptoms of myelopathy.

## 2017-03-31 ENCOUNTER — Ambulatory Visit: Payer: Medicare Other | Admitting: Internal Medicine

## 2017-04-06 ENCOUNTER — Encounter: Payer: Self-pay | Admitting: Internal Medicine

## 2017-04-07 ENCOUNTER — Telehealth: Payer: Self-pay | Admitting: Internal Medicine

## 2017-04-07 ENCOUNTER — Encounter: Payer: Self-pay | Admitting: Internal Medicine

## 2017-04-07 NOTE — Telephone Encounter (Signed)
Copied from [place]. Topic: Quick Communication - See Telephone Encounter >> Apr 07, 2017  3:04 PM Bea Graff, NT wrote: CRM for notification. See Telephone encounter for: Mickel Baas from [facility] requesting a call from Dr. Judi Cong nurse for verbal orders to continue PT for 2 times a week for 6 weeks. CB#: [phone]  04/07/17.

## 2017-04-07 NOTE — Telephone Encounter (Signed)
Ok for verbals 

## 2017-04-08 ENCOUNTER — Encounter: Payer: Self-pay | Admitting: Internal Medicine

## 2017-04-08 NOTE — Telephone Encounter (Signed)
Notified Laura w/ MD response../lmb ?

## 2017-04-25 ENCOUNTER — Inpatient Hospital Stay: Payer: Medicare Other

## 2017-04-25 ENCOUNTER — Inpatient Hospital Stay: Payer: Medicare Other | Attending: Hematology & Oncology | Admitting: Family

## 2017-04-25 VITALS — BP 160/42 | HR 52 | Temp 98.0°F | Resp 17 | Wt 121.5 lb

## 2017-04-25 DIAGNOSIS — N189 Chronic kidney disease, unspecified: Secondary | ICD-10-CM | POA: Insufficient documentation

## 2017-04-25 DIAGNOSIS — N183 Chronic kidney disease, stage 3 unspecified: Secondary | ICD-10-CM

## 2017-04-25 DIAGNOSIS — M25551 Pain in right hip: Secondary | ICD-10-CM | POA: Diagnosis not present

## 2017-04-25 DIAGNOSIS — D509 Iron deficiency anemia, unspecified: Secondary | ICD-10-CM | POA: Diagnosis not present

## 2017-04-25 DIAGNOSIS — D500 Iron deficiency anemia secondary to blood loss (chronic): Secondary | ICD-10-CM

## 2017-04-25 DIAGNOSIS — M25511 Pain in right shoulder: Secondary | ICD-10-CM | POA: Diagnosis not present

## 2017-04-25 DIAGNOSIS — M1991 Primary osteoarthritis, unspecified site: Secondary | ICD-10-CM

## 2017-04-25 DIAGNOSIS — D631 Anemia in chronic kidney disease: Secondary | ICD-10-CM

## 2017-04-25 DIAGNOSIS — E559 Vitamin D deficiency, unspecified: Secondary | ICD-10-CM | POA: Diagnosis not present

## 2017-04-25 LAB — CMP (CANCER CENTER ONLY)
ALBUMIN: 3.7 g/dL (ref 3.5–5.0)
ALT: 7 U/L (ref 0–55)
ANION GAP: 7 (ref 3–11)
AST: 17 U/L (ref 5–34)
Alkaline Phosphatase: 39 U/L — ABNORMAL LOW (ref 40–150)
BILIRUBIN TOTAL: 0.3 mg/dL (ref 0.2–1.2)
BUN: 25 mg/dL (ref 7–26)
CHLORIDE: 107 mmol/L (ref 98–109)
CO2: 24 mmol/L (ref 22–29)
Calcium: 11 mg/dL — ABNORMAL HIGH (ref 8.4–10.4)
Creatinine: 1.97 mg/dL — ABNORMAL HIGH (ref 0.60–1.10)
GFR, Est AFR Am: 24 mL/min — ABNORMAL LOW (ref >60)
GFR, Estimated: 21 mL/min — ABNORMAL LOW (ref >60)
GLUCOSE: 142 mg/dL — AB (ref 70–140)
POTASSIUM: 4.6 mmol/L (ref 3.5–5.1)
Sodium: 138 mmol/L (ref 136–145)
TOTAL PROTEIN: 7 g/dL (ref 6.4–8.3)

## 2017-04-25 LAB — CBC WITH DIFFERENTIAL (CANCER CENTER ONLY)
BAND NEUTROPHILS: 0 %
BASOS ABS: 0 10*3/uL (ref 0.0–0.1)
BLASTS: 0 %
Basophils Relative: 0 %
EOS ABS: 0 10*3/uL (ref 0.0–0.5)
Eosinophils Relative: 1 %
HEMATOCRIT: 30.6 % — AB (ref 34.8–46.6)
HEMOGLOBIN: 9.6 g/dL — AB (ref 11.6–15.9)
LYMPHS PCT: 32 %
Lymphs Abs: 1 10*3/uL (ref 0.9–3.3)
MCH: 32 pg (ref 26.0–34.0)
MCHC: 31.4 g/dL — ABNORMAL LOW (ref 32.0–36.0)
MCV: 102 fL — ABNORMAL HIGH (ref 81.0–101.0)
METAMYELOCYTES PCT: 0 %
Monocytes Absolute: 0.2 10*3/uL (ref 0.1–0.9)
Monocytes Relative: 5 %
Myelocytes: 0 %
Neutro Abs: 1.8 10*3/uL (ref 1.5–6.5)
Neutrophils Relative %: 62 %
OTHER: 0 %
PROMYELOCYTES ABS: 0 %
Platelet Count: 336 10*3/uL (ref 145–400)
RBC: 3 MIL/uL — ABNORMAL LOW (ref 3.70–5.32)
RDW: 14.5 % (ref 11.1–15.7)
WBC Count: 3 10*3/uL — ABNORMAL LOW (ref 3.9–10.0)
nRBC: 0 /100 WBC

## 2017-04-25 LAB — RETICULOCYTES
RBC.: 3.04 MIL/uL — AB (ref 3.70–5.45)
RETIC COUNT ABSOLUTE: 45.6 10*3/uL (ref 33.7–90.7)
RETIC CT PCT: 1.5 % (ref 0.7–2.1)

## 2017-04-25 MED ORDER — DARBEPOETIN ALFA 300 MCG/0.6ML IJ SOSY
300.0000 ug | PREFILLED_SYRINGE | Freq: Once | INTRAMUSCULAR | Status: AC
  Administered 2017-04-25: 300 ug via SUBCUTANEOUS

## 2017-04-25 MED ORDER — DARBEPOETIN ALFA 300 MCG/0.6ML IJ SOSY
PREFILLED_SYRINGE | INTRAMUSCULAR | Status: AC
  Filled 2017-04-25: qty 0.6

## 2017-04-25 NOTE — Progress Notes (Signed)
Hematology and Oncology Follow Up Visit  Allison Page [id number] 1922/05/12 84 y.o. 04/25/2017   Principle Diagnosis:  Anemia of renal insufficiency Iron deficiency anemia  Current Therapy:   IV iron as indicated Aranesp 300 g subcutaneous for hemoglobin less than 10 - last dose given onOctober2018   Interim History:  Allison Page is here today with her daughter for follow-up. She is doing well and her only complaint at this time is generlized discomfort due to arthritis.  No episodes of bleeding, no bruising or petechiae.  She has had no fever, chills, n/v, cough, rash, dizziness, SOB, chest pain, palpitations, abdominal pain or changes in bowel or bladder habits.  She has chronic puffiness in the right ankle and foot due to a Baker's cyst. No numbness or tingling in her extremities.  She has maintained a good appetite and is staying well hydrated. Her weight is stable.   ECOG Performance Status: 1 - Symptomatic but completely ambulatory  Medications:  Allergies as of 04/25/2017      Reactions   Allegra Allergy [fexofenadine Hcl] Anaphylaxis   Amoxicillin    Penicillins    Sulfa Antibiotics       Medication List        Accurate as of 04/25/17  2:03 PM. Always use your most recent med list.          acetaminophen 500 MG tablet Commonly known as:  TYLENOL Take 1 tablet (500 mg total) by mouth every 6 (six) hours as needed.   aluminum-magnesium hydroxide-simethicone 093-235-57 MG/5ML Susp Commonly known as:  MAALOX 2 teaspoons by mouth 3 times daily as needed for heartburn   amLODipine 10 MG tablet Commonly known as:  NORVASC TAKE (1/2) TABLET BY MOUTH ONCE DAILY.   aspirin 81 MG tablet TAKE ONE TABLET BY MOUTH ONCE DAILY.   atorvastatin 20 MG tablet Commonly known as:  LIPITOR TAKE (1) TABLET BY MOUTH AT BEDTIME.   clindamycin 150 MG capsule Commonly known as:  CLEOCIN 4 by mouth 1 hour prior to dental procedures   conjugated estrogens vaginal  cream Commonly known as:  PREMARIN Place 1 Applicatorful vaginally daily.   darifenacin 7.5 MG 24 hr tablet Commonly known as:  ENABLEX Take 1 tablet (7.5 mg total) by mouth daily.   donepezil 10 MG tablet Commonly known as:  ARICEPT TAKE ONE TABLET BY MOUTH ONCE DAILY.   hydrALAZINE 50 MG tablet Commonly known as:  APRESOLINE Take 1 tablet (50 mg total) by mouth 3 (three) times daily.   levothyroxine 100 MCG tablet Commonly known as:  SYNTHROID, LEVOTHROID TAKE ONE TABLET BY MOUTH ONCE DAILY.   magnesium oxide 400 MG tablet Commonly known as:  MAG-OX Take 400 mg by mouth daily.   Melatonin 3 MG Tabs Take 1 tablet (3 mg total) by mouth at bedtime.   metoprolol succinate 25 MG 24 hr tablet Commonly known as:  TOPROL XL Take 1 tablet (25 mg total) by mouth daily.   NASACORT ALLERGY 24HR 55 MCG/ACT Aero nasal inhaler Generic drug:  triamcinolone SPRAY 2 SPRAYS INTO EACH NOSTRIL DAILY.   oseltamivir 75 MG capsule Commonly known as:  TAMIFLU Take 1 capsule (75 mg total) by mouth 2 (two) times daily.   pantoprazole 40 MG tablet Commonly known as:  PROTONIX TAKE (1) TABLET BY MOUTH TWICE DAILY 30-60 MINUTES BEFORE BREAKFAST AND DINNER.   polyethylene glycol packet Commonly known as:  MIRALAX / GLYCOLAX Take 17 g by mouth daily.   senna-docusate 8.6-50 MG tablet Commonly  known as:  Senokot-S Take 1-2 tablets, IF NEEDED, every 12 hrs for constipation   traMADol 50 MG tablet Commonly known as:  ULTRAM TAKE (1) TABLET BY MOUTH (3) TIMES DAILY.   triamcinolone cream 0.1 % Commonly known as:  KENALOG Apply 1 application topically 2 (two) times daily as needed.   vitamin B-12 1000 MCG tablet Commonly known as:  CYANOCOBALAMIN TAKE ONE TABLET BY MOUTH ONCE DAILY.       Allergies:  Allergies  Allergen Reactions  . Allegra Allergy [Fexofenadine Hcl] Anaphylaxis  . Amoxicillin   . Penicillins   . Sulfa Antibiotics     Past Medical History, Surgical history,  Social history, and Family History were reviewed and updated.  Review of Systems: All other 10 point review of systems is negative.   Physical Exam:  weight is 121 lb 8 oz (55.1 kg). Her oral temperature is 98 F (36.7 C). Her blood pressure is 160/42 (abnormal) and her pulse is 52 (abnormal). Her respiration is 17 and oxygen saturation is 99%.   Wt Readings from Last 3 Encounters:  04/25/17 121 lb 8 oz (55.1 kg)  03/14/17 122 lb (55.3 kg)  02/07/17 125 lb (56.7 kg)    Ocular: Sclerae unicteric, pupils equal, round and reactive to light Ear-nose-throat: Oropharynx clear, dentition fair Lymphatic: No cervical, supraclavicular or axillary adenopathy Lungs no rales or rhonchi, good excursion bilaterally Heart regular rate and rhythm, no murmur appreciated Abd soft, nontender, positive bowel sounds, no liver or spleen tip palpated on exam, no fluid wave  MSK no focal spinal tenderness, no joint edema Neuro: non-focal, well-oriented, appropriate affect Breasts: Deferred   Lab Results  Component Value Date   WBC 3.0 (L) 04/25/2017   HGB 9.0 (L) 11/29/2016   HCT 30.6 (L) 04/25/2017   MCV 102.0 (H) 04/25/2017   PLT 336 04/25/2017   Lab Results  Component Value Date   FERRITIN 73 03/14/2017   IRON 100 03/14/2017   TIBC 293 03/14/2017   UIBC 193 03/14/2017   IRONPCTSAT 34 03/14/2017   Lab Results  Component Value Date   RETICCTPCT 1.0 03/14/2017   RBC 3.00 (L) 04/25/2017   RETICCTABS 60.5 12/13/2014   No results found for: KPAFRELGTCHN, LAMBDASER, KAPLAMBRATIO No results found for: IGGSERUM, IGA, IGMSERUM No results found for: Ronnald Ramp, A1GS, A2GS, BETS, BETA2SER, GAMS, MSPIKE, SPEI   Chemistry      Component Value Date/Time   NA 147 (H) 03/14/2017 1241   NA 141 11/29/2016 1314   K 4.4 03/14/2017 1241   K 3.9 11/29/2016 1314   CL 110 (H) 03/14/2017 1241   CL 106 11/29/2016 1314   CO2 25 03/14/2017 1241   CO2 27 11/29/2016 1314   BUN 34 (H) 03/14/2017  1241   BUN 28 (H) 11/29/2016 1314   CREATININE 2.00 (H) 03/14/2017 1241   CREATININE 2.1 (H) 11/29/2016 1314      Component Value Date/Time   CALCIUM 10.4 (H) 03/14/2017 1241   CALCIUM 10.5 (H) 11/29/2016 1314   ALKPHOS 45 03/14/2017 1241   ALKPHOS 63 11/29/2016 1314   AST 18 03/14/2017 1241   ALT 12 03/14/2017 1241   ALT 19 11/29/2016 1314   BILITOT 0.5 03/14/2017 1241      Impression and Plan: Ms. Koehl is a very pleasant 84 yo caucasian female with iron deficiency anemia and anemia of chronic renal insufficiency. She is doing well.  Hgb is 9.6 so we will proceed with Aranesp today as planned.  We will see  what her iron studies show and bring her back in for infusion if needed.  We will plan to see her back in another 4-5 weeks for follow-up.  Her family will contact our office with any questions or concerns. We can certainly see her sooner if need be.   Laverna Peace, NP 3/25/20192:03 PM

## 2017-04-25 NOTE — Patient Instructions (Signed)

## 2017-04-26 ENCOUNTER — Encounter: Payer: Self-pay | Admitting: Internal Medicine

## 2017-04-26 LAB — IRON AND TIBC
IRON: 108 ug/dL (ref 41–142)
Saturation Ratios: 42 % (ref 21–57)
TIBC: 258 ug/dL (ref 236–444)
UIBC: 150 ug/dL

## 2017-04-26 LAB — FERRITIN: Ferritin: 98 ng/mL (ref 9–269)

## 2017-04-26 NOTE — Telephone Encounter (Signed)
[facility] for [facility]ron to contact pharmacist to see if any meds can be made liquid or changed to meds that can be crushed

## 2017-05-30 ENCOUNTER — Other Ambulatory Visit: Payer: Medicare Other

## 2017-05-30 ENCOUNTER — Ambulatory Visit: Payer: Medicare Other | Admitting: Family

## 2017-05-30 NOTE — Progress Notes (Signed)
[facility] [address] [place], [place] [address] Phone: [phone] Subjective:       CC: Neck and shoulder pain  CLE:[id number]  Allison Page is a 84 y.o. female coming in with complaint of neck and right shoulder pain. She is having pain in her thoracic spine as well. She also complains of right knee pain. She was doing therapy for all the areas but her therapy ran out and she isn't able to do any more due to insurance. Pain is in posterior aspect of the shoulder and scapula.   She is having right knee pain and she feels a catching sometimes when she stands up. She does have sharp pain occasionally.  Patient states that it can be severe.  Walking with the aid of a walker.  Some mild swelling.  Some mild instability.     Past Medical History:  Diagnosis Date  . Acid reflux   . Anemia of chronic renal failure, stage 3 (moderate) ([place]) 03/21/2014  . Arthritis   . Dementia 11/19/2014  . Diabetes ([place])   . High blood pressure   . High cholesterol   . Hypothyroidism   . Iron deficiency anemia 03/21/2014  . TIA (transient ischemic attack)    Past Surgical History:  Procedure Laterality Date  . ANTERIOR (CYSTOCELE) AND POSTERIOR REPAIR (RECTOCELE) WITH XENFORM GRAFT AND SACROSPINOUS FIXATION    . BLADDER SUSPENSION    . BREAST BIOPSY    . CATARACT EXTRACTION    . CHOLECYSTECTOMY  2013  . COLONOSCOPY  05/02/2007  . ERCP  2014  . TOTAL HIP ARTHROPLASTY Left 05/06/2008  . VAGINAL HYSTERECTOMY     Social History   Socioeconomic History  . Marital status: Unknown    Spouse name: Not on file  . Number of children: 3  . Years of education: Not on file  . Highest education level: Not on file  Occupational History  . Occupation: retired  Scientific laboratory technician  . Financial resource strain: Not on file  . Food insecurity:    Worry: Not on file    Inability: Not on file  . Transportation needs:    Medical: Not on file    Non-medical: Not on file    Tobacco Use  . Smoking status: Former Smoker    Packs/day: 1.00    Years: 25.00    Pack years: 25.00    Types: Cigarettes    Last attempt to quit: 04/11/1970    Years since quitting: 47.1  . Smokeless tobacco: Never Used  . Tobacco comment: quit 42 years ago  Substance and Sexual Activity  . Alcohol use: No    Alcohol/week: 0.0 oz  . Drug use: No  . Sexual activity: Not on file  Lifestyle  . Physical activity:    Days per week: Not on file    Minutes per session: Not on file  . Stress: Not on file  Relationships  . Social connections:    Talks on phone: Not on file    Gets together: Not on file    Attends religious service: Not on file    Active member of club or organization: Not on file    Attends meetings of clubs or organizations: Not on file    Relationship status: Not on file  Other Topics Concern  . Not on file  Social History Narrative   Diet: Diabetic diet   Caffeine:no    Married: widowed   House: Assisted Living  Pets: No   Current/Past profession: Housewife   Exercise: No   Living Will: Yes   DNR: Yes   POA/HPOA: N/A      Allergies  Allergen Reactions  . Allegra Allergy [Fexofenadine Hcl] Anaphylaxis  . Amoxicillin   . Penicillins   . Sulfa Antibiotics    Family History  Problem Relation Age of Onset  . Heart attack Father   . Arthritis Mother   . Cancer Sister   . Esophageal cancer Daughter   . Seizures Daughter        As a teenager  . Colon cancer Son      Past medical history, social, surgical and family history all reviewed in electronic medical record.  No pertanent information unless stated regarding to the chief complaint.   Review of Systems:Review of systems updated and as accurate as of 05/31/17  No headache, visual changes, nausea, vomiting, diarrhea, constipation, dizziness, abdominal pain, skin rash, fevers, chills, night sweats, weight loss, swollen lymph nodes, body aches, joint swelling,chest pain, shortness of breath,  mood changes.  Positive muscle aches  Objective  Blood pressure (!) 158/62, pulse (!) 58, height 5\' 2"  (1.575 m), weight 122 lb (55.3 kg), SpO2 98 %. Systems examined below as of 05/31/17   General: No apparent distress alert and oriented x3 mood and affect normal, dressed appropriately.  HEENT: Pupils equal, extraocular movements intact  Respiratory: Patient's speak in full sentences and does not appear short of breath  Cardiovascular: trace lower extremity edema, non tender, no erythema  Skin: Warm dry intact with no signs of infection or rash on extremities or on axial skeleton.  Abdomen: Soft nontender  Neuro: Cranial nerves II through XII are intact, neurovascularly intact in all extremities with 2+ DTRs and 2+ pulses.  Lymph: No lymphadenopathy of posterior or anterior cervical chain or axillae bilaterally.  Gait severely antalgic with walker  MSK: Severe arthritic changes of multiple joints.  Crepitus noted.  Knee: Right valgus deformity noted. Large thigh to calf ratio.  Tender to palpation over medial and PF joint line.  ROM lacks last 5 degrees of extension instability with valgus force.  painful patellar compression. Patellar glide with moderate crepitus. Patellar and quadriceps tendons unremarkable. Hamstring and quadriceps strength is normal. Contralateral knee shows contralateral knee also has arthritic changes.  Right shoulder shows severe atrophy of the musculature.  Significant crepitus with range of motion.  3 out of 5 strength compared to the contralateral side.  After informed written and verbal consent, patient was seated on exam table. Right shoulder was prepped with alcohol swab and utilizing posterior approach, patient's right glenohumeral space was injected with 4:1  marcaine 0.5%: Kenalog 40mg /dL. Patient tolerated the procedure well without immediate complications.  After informed written and verbal consent, patient was seated on exam table. Right knee was  prepped with alcohol swab and utilizing anterolateral approach, patient's right knee space was injected with 4:1  marcaine 0.5%: Kenalog 40mg /dL. Patient tolerated the procedure well without immediate complications.     Impression and Recommendations:     This case required medical decision making of moderate complexity.      Note: This dictation was prepared with Dragon dictation along with smaller phrase technology. Any transcriptional errors that result from this process are unintentional.

## 2017-05-31 ENCOUNTER — Ambulatory Visit (INDEPENDENT_AMBULATORY_CARE_PROVIDER_SITE_OTHER): Payer: Medicare Other | Admitting: Family Medicine

## 2017-05-31 ENCOUNTER — Encounter: Payer: Self-pay | Admitting: Family Medicine

## 2017-05-31 DIAGNOSIS — M75102 Unspecified rotator cuff tear or rupture of left shoulder, not specified as traumatic: Secondary | ICD-10-CM | POA: Diagnosis not present

## 2017-05-31 DIAGNOSIS — M12811 Other specific arthropathies, not elsewhere classified, right shoulder: Secondary | ICD-10-CM | POA: Diagnosis not present

## 2017-05-31 DIAGNOSIS — M12812 Other specific arthropathies, not elsewhere classified, left shoulder: Secondary | ICD-10-CM

## 2017-05-31 DIAGNOSIS — M75101 Unspecified rotator cuff tear or rupture of right shoulder, not specified as traumatic: Secondary | ICD-10-CM | POA: Diagnosis not present

## 2017-05-31 DIAGNOSIS — M1711 Unilateral primary osteoarthritis, right knee: Secondary | ICD-10-CM

## 2017-05-31 NOTE — Patient Instructions (Signed)
Good to see you  I am sorry I do not have a mgic wand.  Ice 20 minutes 2 times daily. Usually after activity and before bed. pennsaid pinkie amount topically 2 times daily as needed.  Injected the shoulder and the knee.  Watch blood sugars a little closer next 3 days See me again in 6 weeks

## 2017-05-31 NOTE — Assessment & Plan Note (Signed)
Patient given injection.  Tolerated the procedure well.  We discussed icing regimen and home exercises.  Discussed which activities to do which wants to avoid.  Patient will increase activity as tolerated.  Patient given topical anti-inflammatories.  Do not feel that a custom brace would be beneficial because patient will have difficulty wearing it.  Patient is not a surgical candidate.  Worsening symptoms though patient could be a candidate for Visco supplementation

## 2017-05-31 NOTE — Assessment & Plan Note (Signed)
Patient given injection today.  Does have rotator cuff arthropathy bilaterally but only to the right shoulder.  Discussed icing regimen and home exercises.  Discussed topical anti-inflammatories which was given.  Continue conservative therapy and follow-up every 10 to 12 weeks for injection.

## 2017-06-06 ENCOUNTER — Encounter: Payer: Self-pay | Admitting: Internal Medicine

## 2017-06-06 DIAGNOSIS — D500 Iron deficiency anemia secondary to blood loss (chronic): Secondary | ICD-10-CM

## 2017-06-06 DIAGNOSIS — D61818 Other pancytopenia: Secondary | ICD-10-CM

## 2017-06-06 MED ORDER — DICLOFENAC SODIUM 1 % TD GEL: 4.0000 g | Freq: Four times a day (QID) | TRANSDERMAL | 5 refills | Status: DC | PRN

## 2017-06-06 MED ORDER — TRAMADOL HCL 50 MG PO TABS: ORAL_TABLET | ORAL | 1 refills | Status: DC

## 2017-06-06 NOTE — Addendum Note (Signed)
Addended by: Biagio Borg on: 06/06/2017 12:59 PM   Modules accepted: Orders

## 2017-06-06 NOTE — Telephone Encounter (Signed)
[place] for increased tramadol to qid prn - done erx  I do not normally treat chronic pain with higher narcotics such as hydrocodone or percocet, so this is best I can do

## 2017-06-08 ENCOUNTER — Ambulatory Visit: Payer: Medicare Other | Admitting: Family

## 2017-06-08 ENCOUNTER — Other Ambulatory Visit: Payer: Medicare Other

## 2017-06-10 ENCOUNTER — Encounter: Payer: Self-pay | Admitting: Neurology

## 2017-06-10 ENCOUNTER — Ambulatory Visit (INDEPENDENT_AMBULATORY_CARE_PROVIDER_SITE_OTHER): Payer: Medicare Other | Admitting: Neurology

## 2017-06-10 DIAGNOSIS — F03B0 Unspecified dementia, moderate, without behavioral disturbance, psychotic disturbance, mood disturbance, and anxiety: Secondary | ICD-10-CM

## 2017-06-10 DIAGNOSIS — F0390 Unspecified dementia without behavioral disturbance: Secondary | ICD-10-CM | POA: Diagnosis not present

## 2017-06-10 MED ORDER — DONEPEZIL HCL 10 MG PO TABS: 10.0000 mg | ORAL_TABLET | Freq: Every day | ORAL | 3 refills | Status: AC

## 2017-06-10 NOTE — Patient Instructions (Signed)
Great seeing you! Continue Donepezil 10mg  daily. Follow-up in 1 year, call for any changes.   RECOMMENDATIONS FOR ALL PATIENTS WITH MEMORY PROBLEMS: 1. Continue to exercise (Recommend 30 minutes of walking everyday, or 3 hours every week) 2. Increase social interactions - continue going to [place] and enjoy social gatherings with friends and family 3. Eat healthy, avoid fried foods and eat more fruits and vegetables 4. Maintain adequate blood pressure, blood sugar, and blood cholesterol level. Reducing the risk of stroke and cardiovascular disease also helps promoting better memory. 5. Avoid stressful situations. Live a simple life and avoid aggravations. Organize your time and prepare for the next day in anticipation. 6. Sleep well, avoid any interruptions of sleep and avoid any distractions in the bedroom that may interfere with adequate sleep quality 7. Avoid sugar, avoid sweets as there is a strong link between excessive sugar intake, diabetes, and cognitive impairment The Mediterranean diet has been shown to help patients reduce the risk of progressive memory disorders and reduces cardiovascular risk. This includes eating fish, eat fruits and green leafy vegetables, nuts like almonds and hazelnuts, walnuts, and also use olive oil. Avoid fast foods and fried foods as much as possible. Avoid sweets and sugar as sugar use has been linked to worsening of memory function.
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
## 2017-06-15 ENCOUNTER — Encounter: Payer: Self-pay | Admitting: Internal Medicine

## 2017-06-16 ENCOUNTER — Inpatient Hospital Stay: Payer: Medicare Other

## 2017-06-16 ENCOUNTER — Inpatient Hospital Stay (HOSPITAL_BASED_OUTPATIENT_CLINIC_OR_DEPARTMENT_OTHER): Payer: Medicare Other | Admitting: Family

## 2017-06-16 ENCOUNTER — Inpatient Hospital Stay: Payer: Medicare Other | Attending: Hematology & Oncology

## 2017-06-16 ENCOUNTER — Other Ambulatory Visit: Payer: Self-pay

## 2017-06-16 ENCOUNTER — Encounter: Payer: Self-pay | Admitting: Family

## 2017-06-16 VITALS — BP 158/43 | HR 58 | Temp 98.2°F | Resp 18 | Wt 116.0 lb

## 2017-06-16 DIAGNOSIS — N183 Chronic kidney disease, stage 3 unspecified: Secondary | ICD-10-CM

## 2017-06-16 DIAGNOSIS — D631 Anemia in chronic kidney disease: Secondary | ICD-10-CM | POA: Diagnosis present

## 2017-06-16 DIAGNOSIS — N189 Chronic kidney disease, unspecified: Secondary | ICD-10-CM | POA: Diagnosis not present

## 2017-06-16 DIAGNOSIS — D500 Iron deficiency anemia secondary to blood loss (chronic): Secondary | ICD-10-CM | POA: Diagnosis present

## 2017-06-16 DIAGNOSIS — R634 Abnormal weight loss: Secondary | ICD-10-CM | POA: Diagnosis not present

## 2017-06-16 DIAGNOSIS — Z79899 Other long term (current) drug therapy: Secondary | ICD-10-CM | POA: Diagnosis not present

## 2017-06-16 DIAGNOSIS — Z7982 Long term (current) use of aspirin: Secondary | ICD-10-CM | POA: Insufficient documentation

## 2017-06-16 LAB — CBC WITH DIFFERENTIAL (CANCER CENTER ONLY)
BAND NEUTROPHILS: 0 %
Basophils Absolute: 0 10*3/uL (ref 0.0–0.1)
Basophils Relative: 0 %
Blasts: 0 %
EOS ABS: 0 10*3/uL (ref 0.0–0.5)
Eosinophils Relative: 1 %
HCT: 28.8 % — ABNORMAL LOW (ref 34.8–46.6)
Hemoglobin: 9.2 g/dL — ABNORMAL LOW (ref 11.6–15.9)
LYMPHS PCT: 24 %
Lymphs Abs: 0.9 10*3/uL (ref 0.9–3.3)
MCH: 33.2 pg (ref 26.0–34.0)
MCHC: 31.9 g/dL — AB (ref 32.0–36.0)
MCV: 104 fL — AB (ref 81.0–101.0)
MONOS PCT: 6 %
Metamyelocytes Relative: 0 %
Monocytes Absolute: 0.2 10*3/uL (ref 0.1–0.9)
Myelocytes: 0 %
NEUTROS ABS: 2.6 10*3/uL (ref 1.5–6.5)
NEUTROS PCT: 69 %
NRBC: 0 /100{WBCs}
OTHER: 0 %
PLATELETS: 308 10*3/uL (ref 145–400)
Promyelocytes Relative: 0 %
RBC: 2.77 MIL/uL — AB (ref 3.70–5.32)
RDW: 14.5 % (ref 11.1–15.7)
WBC Count: 3.7 10*3/uL — ABNORMAL LOW (ref 3.9–10.0)

## 2017-06-16 LAB — CMP (CANCER CENTER ONLY)
ALK PHOS: 39 U/L — AB (ref 40–150)
ALT: 7 U/L (ref 0–55)
AST: 18 U/L (ref 5–34)
Albumin: 3.7 g/dL (ref 3.5–5.0)
Anion gap: 7 (ref 3–11)
BUN: 23 mg/dL (ref 7–26)
CALCIUM: 10.9 mg/dL — AB (ref 8.4–10.4)
CHLORIDE: 109 mmol/L (ref 98–109)
CO2: 23 mmol/L (ref 22–29)
CREATININE: 1.66 mg/dL — AB (ref 0.60–1.10)
GFR, EST NON AFRICAN AMERICAN: 26 mL/min — AB (ref >60)
GFR, Est AFR Am: 30 mL/min — ABNORMAL LOW (ref >60)
Glucose, Bld: 137 mg/dL (ref 70–140)
Potassium: 5.1 mmol/L (ref 3.5–5.1)
Sodium: 139 mmol/L (ref 136–145)
Total Bilirubin: <0.2 mg/dL — ABNORMAL LOW (ref 0.2–1.2)
Total Protein: 6.8 g/dL (ref 6.4–8.3)

## 2017-06-16 MED ORDER — DARBEPOETIN ALFA 300 MCG/0.6ML IJ SOSY
PREFILLED_SYRINGE | INTRAMUSCULAR | Status: AC
  Filled 2017-06-16: qty 0.6

## 2017-06-16 MED ORDER — DARBEPOETIN ALFA 300 MCG/0.6ML IJ SOSY
300.0000 ug | PREFILLED_SYRINGE | Freq: Once | INTRAMUSCULAR | Status: AC
  Administered 2017-06-16: 300 ug via SUBCUTANEOUS

## 2017-06-16 NOTE — Progress Notes (Signed)
Hematology and Oncology Follow Up Visit  Allison Page [id number] Feb 20, 1922 84 y.o. 06/16/2017   Principle Diagnosis:  Anemia of renal insufficiency Iron deficiency anemia  Current Therapy:   IV iron as indicated Aranesp 300 g subcutaneous for hemoglobin less than 10    Interim History:  Allison Page is here today with her daughter for follow-up. She is doing well but has had increased arthritic aches and pains "all over." No episodes of bleeding, no bruising or petechiae. Hgb is 9.2, MCV 104.  She does not have much of an appetite and weight is down 5 lbs. Her daughter states that this is an ongoing issue and they are working to find things she likes to eat. She is staying hydrated.  No fever, chills, n/v, cough, rash, dizziness, SOB, chest pain, palpitations, abdominal pain or changes in bowel or bladder habits.  The swelling in her right leg waxes and wanes due to Baker's cyst. No numbness or tingling in her extremities.  No lymphadenopathy found on exam.    ECOG Performance Status: 1 - Symptomatic but completely ambulatory  Medications:  Allergies as of 06/16/2017      Reactions   Allegra Allergy [fexofenadine Hcl] Anaphylaxis   Amoxicillin    Penicillins    Sulfa Antibiotics       Medication List        Accurate as of 06/16/17  3:58 PM. Always use your most recent med list.          acetaminophen 500 MG tablet Commonly known as:  TYLENOL Take 1 tablet (500 mg total) by mouth every 6 (six) hours as needed.   aluminum-magnesium hydroxide-simethicone 101-751-02 MG/5ML Susp Commonly known as:  MAALOX 2 teaspoons by mouth 3 times daily as needed for heartburn   amLODipine 10 MG tablet Commonly known as:  NORVASC TAKE (1/2) TABLET BY MOUTH ONCE DAILY.   aspirin 81 MG tablet TAKE ONE TABLET BY MOUTH ONCE DAILY.   atorvastatin 20 MG tablet Commonly known as:  LIPITOR TAKE (1) TABLET BY MOUTH AT BEDTIME.   clindamycin 150 MG capsule Commonly known as:   CLEOCIN 4 by mouth 1 hour prior to dental procedures   conjugated estrogens vaginal cream Commonly known as:  PREMARIN Place 1 Applicatorful vaginally daily.   darifenacin 7.5 MG 24 hr tablet Commonly known as:  ENABLEX Take 1 tablet (7.5 mg total) by mouth daily.   diclofenac sodium 1 % Gel Commonly known as:  VOLTAREN Apply 4 g topically 4 (four) times daily as needed.   donepezil 10 MG tablet Commonly known as:  ARICEPT Take 1 tablet (10 mg total) by mouth daily.   hydrALAZINE 50 MG tablet Commonly known as:  APRESOLINE Take 1 tablet (50 mg total) by mouth 3 (three) times daily.   levothyroxine 100 MCG tablet Commonly known as:  SYNTHROID, LEVOTHROID TAKE ONE TABLET BY MOUTH ONCE DAILY.   magnesium oxide 400 MG tablet Commonly known as:  MAG-OX Take 400 mg by mouth daily.   Melatonin 3 MG Tabs Take 1 tablet (3 mg total) by mouth at bedtime.   metoprolol succinate 25 MG 24 hr tablet Commonly known as:  TOPROL XL Take 1 tablet (25 mg total) by mouth daily.   NASACORT ALLERGY 24HR 55 MCG/ACT Aero nasal inhaler Generic drug:  triamcinolone SPRAY 2 SPRAYS INTO EACH NOSTRIL DAILY.   oseltamivir 75 MG capsule Commonly known as:  TAMIFLU Take 1 capsule (75 mg total) by mouth 2 (two) times daily.   pantoprazole  40 MG tablet Commonly known as:  PROTONIX TAKE (1) TABLET BY MOUTH TWICE DAILY 30-60 MINUTES BEFORE BREAKFAST AND DINNER.   polyethylene glycol packet Commonly known as:  MIRALAX / GLYCOLAX Take 17 g by mouth daily.   senna-docusate 8.6-50 MG tablet Commonly known as:  Senokot-S Take 1-2 tablets, IF NEEDED, every 12 hrs for constipation   traMADol 50 MG tablet Commonly known as:  ULTRAM TAKE (1) TABLET BY MOUTH 4 TIMES DAILY.   triamcinolone cream 0.1 % Commonly known as:  KENALOG Apply 1 application topically 2 (two) times daily as needed.   vitamin B-12 1000 MCG tablet Commonly known as:  CYANOCOBALAMIN TAKE ONE TABLET BY MOUTH ONCE DAILY.         Allergies:  Allergies  Allergen Reactions  . Allegra Allergy [Fexofenadine Hcl] Anaphylaxis  . Amoxicillin   . Penicillins   . Sulfa Antibiotics     Past Medical History, Surgical history, Social history, and Family History were reviewed and updated.  Review of Systems: All other 10 point review of systems is negative.   Physical Exam:  weight is 116 lb (52.6 kg). Her oral temperature is 98.2 F (36.8 C). Her blood pressure is 158/43 (abnormal) and her pulse is 58 (abnormal). Her respiration is 18 and oxygen saturation is 99%.   Wt Readings from Last 3 Encounters:  06/16/17 116 lb (52.6 kg)  06/10/17 120 lb (54.4 kg)  05/31/17 122 lb (55.3 kg)    Ocular: Sclerae unicteric, pupils equal, round and reactive to light Ear-nose-throat: Oropharynx clear, dentition fair Lymphatic: No cervical, supraclavicular or axillary adenopathy Lungs no rales or rhonchi, good excursion bilaterally Heart regular rate and rhythm, no murmur appreciated Abd soft, nontender, positive bowel sounds, no liver or spleen tip palpated on exam, no fluids MSK no focal spinal tenderness, no joint edema Neuro: non-focal, well-oriented, appropriate affect Breasts: Deferred   Lab Results  Component Value Date   WBC 3.7 (L) 06/16/2017   HGB 9.2 (L) 06/16/2017   HCT 28.8 (L) 06/16/2017   MCV 104.0 (H) 06/16/2017   PLT 308 06/16/2017   Lab Results  Component Value Date   FERRITIN 98 04/25/2017   IRON 108 04/25/2017   TIBC 258 04/25/2017   UIBC 150 04/25/2017   IRONPCTSAT 42 04/25/2017   Lab Results  Component Value Date   RETICCTPCT 1.5 04/25/2017   RBC 2.77 (L) 06/16/2017   RETICCTABS 60.5 12/13/2014   No results found for: KPAFRELGTCHN, LAMBDASER, KAPLAMBRATIO No results found for: IGGSERUM, IGA, IGMSERUM No results found for: Odetta Pink, SPEI   Chemistry      Component Value Date/Time   NA 138 04/25/2017 1304   NA 141 11/29/2016  1314   K 4.6 04/25/2017 1304   K 3.9 11/29/2016 1314   CL 107 04/25/2017 1304   CL 106 11/29/2016 1314   CO2 24 04/25/2017 1304   CO2 27 11/29/2016 1314   BUN 25 04/25/2017 1304   BUN 28 (H) 11/29/2016 1314   CREATININE 1.97 (H) 04/25/2017 1304   CREATININE 2.1 (H) 11/29/2016 1314      Component Value Date/Time   CALCIUM 11.0 (H) 04/25/2017 1304   CALCIUM 10.5 (H) 11/29/2016 1314   ALKPHOS 39 (L) 04/25/2017 1304   ALKPHOS 63 11/29/2016 1314   AST 17 04/25/2017 1304   ALT 7 04/25/2017 1304   ALT 19 11/29/2016 1314   BILITOT 0.3 04/25/2017 1304      Impression and Plan: Ms. Basara  is a very pleasant 84 yo caucasian female with iron deficeincy anemia and anemia of chronic renal insufficiency. She is doing well but still having problems with arthritis.  Hgb is 9.2 so we will give her Aranesp today.  We will see what her iron studies show and bring her back in for infusion if needed.  We will plan to see her back in another month for follow-up.  She will contact our office with any questions or concerns. We can certainly see her sooner if need be.   Laverna Peace, NP 5/16/20193:58 PM

## 2017-06-17 LAB — IRON AND TIBC
Iron: 99 ug/dL (ref 41–142)
Saturation Ratios: 42 % (ref 21–57)
TIBC: 236 ug/dL (ref 236–444)
UIBC: 138 ug/dL

## 2017-06-17 LAB — FERRITIN: FERRITIN: 105 ng/mL (ref 9–269)

## 2017-06-20 ENCOUNTER — Encounter: Payer: Self-pay | Admitting: Internal Medicine

## 2017-06-21 ENCOUNTER — Encounter: Payer: Self-pay | Admitting: Neurology

## 2017-06-21 MED ORDER — DICLOFENAC SODIUM 1 % TD GEL: 4.0000 g | Freq: Two times a day (BID) | TRANSDERMAL | 5 refills | Status: DC | PRN

## 2017-06-21 NOTE — Telephone Encounter (Signed)
[facility]ir[facility] to fax order for pt to facility

## 2017-06-22 ENCOUNTER — Other Ambulatory Visit: Payer: Self-pay | Admitting: Internal Medicine

## 2017-06-22 MED ORDER — DICLOFENAC SODIUM 1 % TD GEL: 2.0000 g | Freq: Two times a day (BID) | TRANSDERMAL | 5 refills | Status: DC | PRN

## 2017-06-22 NOTE — Telephone Encounter (Signed)
done erx 

## 2017-06-22 NOTE — Telephone Encounter (Signed)
done

## 2017-06-28 ENCOUNTER — Encounter: Payer: Self-pay | Admitting: Internal Medicine

## 2017-06-28 DIAGNOSIS — M1990 Unspecified osteoarthritis, unspecified site: Secondary | ICD-10-CM

## 2017-07-08 ENCOUNTER — Encounter: Payer: Self-pay | Admitting: Internal Medicine

## 2017-07-08 DIAGNOSIS — G8929 Other chronic pain: Secondary | ICD-10-CM

## 2017-07-08 NOTE — Telephone Encounter (Signed)
Stanton Kidney or Cecille Rubin to be aware of pt daughter concern, thanks

## 2017-07-12 NOTE — Addendum Note (Signed)
Addended by: Biagio Borg on: 07/12/2017 12:33 PM   Modules accepted: Orders

## 2017-07-13 ENCOUNTER — Ambulatory Visit: Payer: Medicare Other | Admitting: Family Medicine

## 2017-07-19 ENCOUNTER — Other Ambulatory Visit: Payer: Self-pay | Admitting: Internal Medicine

## 2017-07-21 ENCOUNTER — Other Ambulatory Visit: Payer: Self-pay | Admitting: Family

## 2017-07-21 ENCOUNTER — Encounter: Payer: Self-pay | Admitting: Family

## 2017-07-21 ENCOUNTER — Inpatient Hospital Stay: Payer: Medicare Other

## 2017-07-21 ENCOUNTER — Inpatient Hospital Stay: Payer: Medicare Other | Attending: Hematology & Oncology | Admitting: Family

## 2017-07-21 ENCOUNTER — Other Ambulatory Visit: Payer: Self-pay

## 2017-07-21 VITALS — BP 148/50 | HR 62 | Temp 98.3°F | Resp 18 | Wt 117.0 lb

## 2017-07-21 DIAGNOSIS — Z7982 Long term (current) use of aspirin: Secondary | ICD-10-CM | POA: Insufficient documentation

## 2017-07-21 DIAGNOSIS — D631 Anemia in chronic kidney disease: Secondary | ICD-10-CM | POA: Insufficient documentation

## 2017-07-21 DIAGNOSIS — D500 Iron deficiency anemia secondary to blood loss (chronic): Secondary | ICD-10-CM

## 2017-07-21 DIAGNOSIS — R7989 Other specified abnormal findings of blood chemistry: Secondary | ICD-10-CM | POA: Diagnosis not present

## 2017-07-21 DIAGNOSIS — N183 Chronic kidney disease, stage 3 unspecified: Secondary | ICD-10-CM

## 2017-07-21 DIAGNOSIS — Z79899 Other long term (current) drug therapy: Secondary | ICD-10-CM | POA: Insufficient documentation

## 2017-07-21 DIAGNOSIS — D509 Iron deficiency anemia, unspecified: Secondary | ICD-10-CM | POA: Insufficient documentation

## 2017-07-21 LAB — CMP (CANCER CENTER ONLY)
ALBUMIN: 3.2 g/dL — AB (ref 3.5–5.0)
ALK PHOS: 100 U/L — AB (ref 26–84)
ALT: 50 U/L — AB (ref 10–47)
AST: 66 U/L — ABNORMAL HIGH (ref 11–38)
Anion gap: 7 (ref 5–15)
BUN: 24 mg/dL — AB (ref 7–22)
CO2: 27 mmol/L (ref 18–33)
CREATININE: 1.7 mg/dL — AB (ref 0.60–1.20)
Calcium: 10 mg/dL (ref 8.0–10.3)
Chloride: 110 mmol/L — ABNORMAL HIGH (ref 98–108)
GLUCOSE: 144 mg/dL — AB (ref 73–118)
POTASSIUM: 4.8 mmol/L — AB (ref 3.3–4.7)
SODIUM: 144 mmol/L (ref 128–145)
Total Bilirubin: 0.5 mg/dL (ref 0.2–1.6)
Total Protein: 6.5 g/dL (ref 6.4–8.1)

## 2017-07-21 LAB — CBC WITH DIFFERENTIAL (CANCER CENTER ONLY)
BAND NEUTROPHILS: 0 %
BASOS PCT: 1 %
Basophils Absolute: 0 10*3/uL (ref 0.0–0.1)
Blasts: 0 %
EOS ABS: 0.1 10*3/uL (ref 0.0–0.5)
EOS PCT: 2 %
HCT: 28 % — ABNORMAL LOW (ref 34.8–46.6)
Hemoglobin: 8.7 g/dL — ABNORMAL LOW (ref 11.6–15.9)
LYMPHS ABS: 0.9 10*3/uL (ref 0.9–3.3)
LYMPHS PCT: 26 %
MCH: 33.3 pg (ref 26.0–34.0)
MCHC: 31.1 g/dL — AB (ref 32.0–36.0)
MCV: 107.3 fL — ABNORMAL HIGH (ref 81.0–101.0)
MONO ABS: 0.1 10*3/uL (ref 0.1–0.9)
Metamyelocytes Relative: 0 %
Monocytes Relative: 2 %
Myelocytes: 0 %
NEUTROS ABS: 2.2 10*3/uL (ref 1.5–6.5)
Neutrophils Relative %: 69 %
OTHER: 0 %
PLATELETS: 316 10*3/uL (ref 145–400)
PROMYELOCYTES RELATIVE: 0 %
RBC: 2.61 MIL/uL — ABNORMAL LOW (ref 3.70–5.32)
RDW: 14.6 % (ref 11.1–15.7)
WBC Count: 3.3 10*3/uL — ABNORMAL LOW (ref 3.9–10.0)
nRBC: 0 /100 WBC

## 2017-07-21 MED ORDER — DARBEPOETIN ALFA 300 MCG/0.6ML IJ SOSY
PREFILLED_SYRINGE | INTRAMUSCULAR | Status: AC
  Filled 2017-07-21: qty 0.6

## 2017-07-21 MED ORDER — DARBEPOETIN ALFA 300 MCG/0.6ML IJ SOSY
300.0000 ug | PREFILLED_SYRINGE | Freq: Once | INTRAMUSCULAR | Status: AC
  Administered 2017-07-21: 300 ug via SUBCUTANEOUS

## 2017-07-21 NOTE — Progress Notes (Signed)
Hematology and Oncology Follow Up Visit  Allison Page [id number] [birth year] 84 y.o. 07/21/2017   Principle Diagnosis:  Anemia of renal insufficiency Iron deficiency anemia  Current Therapy:   IV iron as indicated Aranesp 300 g subcutaneous for hemoglobin less than 10    Interim History:  Allison Page is here today with her daughter for follow-up. She is doing well but still has occasional fatigue as well as generalized aches and pains.  Her Hgb is 8.7, MCV 107, platelets 316. She denies any episodes of bleeding, no bruising or petechiae.  Her LFT's are quite elevated this visit when compared to last months labs. Her only medication change has been to start using Voltaren gel.  No fever, chills, n/v, cough, rash, dizziness, SOB, chest pain, palpitations, abdominal pain or changes in bowel or bladder habits.  No lymphadenopathy noted on exam.  No numbness or tingling in her extremities. The puffiness in her ankles, worse in the right ankle due to Baker's cyst, is unchanged.  She states that her appetite is good. She is staying well hydrated and her weight is stable.   ECOG Performance Status: 2 - Symptomatic, <50% confined to bed  Medications:  Allergies as of 07/21/2017      Reactions   Allegra Allergy [fexofenadine Hcl] Anaphylaxis   Amoxicillin    Penicillins    Sulfa Antibiotics       Medication List        Accurate as of 07/21/17  4:02 PM. Always use your most recent med list.          acetaminophen 500 MG tablet Commonly known as:  TYLENOL Take 1 tablet (500 mg total) by mouth every 6 (six) hours as needed.   aluminum-magnesium hydroxide-simethicone [id number] MG/5ML Susp Commonly known as:  MAALOX 2 teaspoons by mouth 3 times daily as needed for heartburn   amLODipine 10 MG tablet Commonly known as:  NORVASC TAKE (1/2) TABLET BY MOUTH ONCE DAILY.   aspirin 81 MG tablet TAKE ONE TABLET BY MOUTH ONCE DAILY.   atorvastatin 20 MG tablet Commonly known  as:  LIPITOR TAKE (1) TABLET BY MOUTH AT BEDTIME.   clindamycin 150 MG capsule Commonly known as:  CLEOCIN 4 by mouth 1 hour prior to dental procedures   conjugated estrogens vaginal cream Commonly known as:  PREMARIN Place 1 Applicatorful vaginally daily.   darifenacin 7.5 MG 24 hr tablet Commonly known as:  ENABLEX Take 1 tablet (7.5 mg total) by mouth daily.   diclofenac sodium 1 % Gel Commonly known as:  VOLTAREN Apply 2 g topically 2 (two) times daily as needed. To neck, shoulder, knee, and lower back   donepezil 10 MG tablet Commonly known as:  ARICEPT Take 1 tablet (10 mg total) by mouth daily.   hydrALAZINE 50 MG tablet Commonly known as:  APRESOLINE Take 1 tablet (50 mg total) by mouth 3 (three) times daily.   levothyroxine 100 MCG tablet Commonly known as:  SYNTHROID, LEVOTHROID TAKE ONE TABLET BY MOUTH ONCE DAILY.   magnesium oxide 400 MG tablet Commonly known as:  MAG-OX Take 400 mg by mouth daily.   Melatonin 3 MG Tabs Take 1 tablet (3 mg total) by mouth at bedtime.   metoprolol succinate 25 MG 24 hr tablet Commonly known as:  TOPROL XL Take 1 tablet (25 mg total) by mouth daily.   NASACORT ALLERGY 24HR 55 MCG/ACT Aero nasal inhaler Generic drug:  triamcinolone SPRAY 2 SPRAYS INTO EACH NOSTRIL DAILY.   oseltamivir 75  MG capsule Commonly known as:  TAMIFLU Take 1 capsule (75 mg total) by mouth 2 (two) times daily.   pantoprazole 40 MG tablet Commonly known as:  PROTONIX TAKE (1) TABLET BY MOUTH TWICE DAILY 30-60 MINUTES BEFORE BREAKFAST AND DINNER.   polyethylene glycol packet Commonly known as:  MIRALAX / GLYCOLAX Take 17 g by mouth daily.   GAVILAX powder Generic drug:  polyethylene glycol powder MIX 1 CAPFUL (17G) IN 8 OUNCES OF JUICE/WATER AND DRINK ONCE DAILY.   senna-docusate 8.6-50 MG tablet Commonly known as:  Senokot-S Take 1-2 tablets, IF NEEDED, every 12 hrs for constipation   traMADol 50 MG tablet Commonly known as:   ULTRAM TAKE (1) TABLET BY MOUTH 4 TIMES DAILY.   triamcinolone cream 0.1 % Commonly known as:  KENALOG Apply 1 application topically 2 (two) times daily as needed.   vitamin B-12 1000 MCG tablet Commonly known as:  CYANOCOBALAMIN TAKE ONE TABLET BY MOUTH ONCE DAILY.       Allergies:  Allergies  Allergen Reactions  . Allegra Allergy [Fexofenadine Hcl] Anaphylaxis  . Amoxicillin   . Penicillins   . Sulfa Antibiotics     Past Medical History, Surgical history, Social history, and Family History were reviewed and updated.  Review of Systems: All other 10 point review of systems is negative.   Physical Exam:  weight is 117 lb (53.1 kg). Her oral temperature is 98.3 F (36.8 C). Her blood pressure is 148/50 (abnormal) and her pulse is 62. Her respiration is 18 and oxygen saturation is 97%.   Wt Readings from Last 3 Encounters:  07/21/17 117 lb (53.1 kg)  06/16/17 116 lb (52.6 kg)  06/10/17 120 lb (54.4 kg)    Ocular: Sclerae unicteric, pupils equal, round and reactive to light Ear-nose-throat: Oropharynx clear, dentition fair Lymphatic: No cervical, supraclavicular or axillary adenopathy Lungs no rales or rhonchi, good excursion bilaterally Heart regular rate and rhythm, no murmur appreciated Abd soft, nontender, positive bowel sounds, no liver or spleen tip palpated on exam, no fluid wave  MSK no focal spinal tenderness, no joint edema Neuro: non-focal, well-oriented, appropriate affect Breasts: Deferred   Lab Results  Component Value Date   WBC 3.3 (L) 07/21/2017   HGB 8.7 (L) 07/21/2017   HCT 28.0 (L) 07/21/2017   MCV 107.3 (H) 07/21/2017   PLT 316 07/21/2017   Lab Results  Component Value Date   FERRITIN 105 06/16/2017   IRON 99 06/16/2017   TIBC 236 06/16/2017   UIBC 138 06/16/2017   IRONPCTSAT 42 06/16/2017   Lab Results  Component Value Date   RETICCTPCT 1.5 04/25/2017   RBC 2.61 (L) 07/21/2017   RETICCTABS 60.5 12/13/2014   No results found for:  KPAFRELGTCHN, LAMBDASER, KAPLAMBRATIO No results found for: IGGSERUM, IGA, IGMSERUM No results found for: Odetta Pink, SPEI   Chemistry      Component Value Date/Time   NA 144 07/21/2017 1407   NA 141 11/29/2016 1314   K 4.8 (H) 07/21/2017 1407   K 3.9 11/29/2016 1314   CL 110 (H) 07/21/2017 1407   CL 106 11/29/2016 1314   CO2 27 07/21/2017 1407   CO2 27 11/29/2016 1314   BUN 24 (H) 07/21/2017 1407   BUN 28 (H) 11/29/2016 1314   CREATININE 1.70 (H) 07/21/2017 1407   CREATININE 2.1 (H) 11/29/2016 1314      Component Value Date/Time   CALCIUM 10.0 07/21/2017 1407   CALCIUM 10.5 (H) 11/29/2016 1314  ALKPHOS 100 (H) 07/21/2017 1407   ALKPHOS 63 11/29/2016 1314   AST 66 (H) 07/21/2017 1407   ALT 50 (H) 07/21/2017 1407   ALT 19 11/29/2016 1314   BILITOT 0.5 07/21/2017 1407      Impression and Plan: Ms. Dower is a very pleasant 84 yo caucasian female with both iron deficiency and anemia of chronic renal insufficiency. She continues to do well and has no new complaints at this time.  Hgb is 8.7, MCV 107, WBC count 3.3 and platelets 316. She received Aranesp today.  We will see what her iron studies show and bring her back in for infusion next week if needed.  I have forwarded her labs to her PCP to review. Her LFT's today are quite high when compared to her last set a month ago. Her only change in medication has been to start Voltaren gel. Per up to date, elevated LFT's can occur in 2-4% of patients on this medication.  We will plan to see her back in another month for follow-up.  They will contact our office with any questions or concerns. We can certainly see her sooner if need be.   Laverna Peace, NP 6/20/20194:02 PM

## 2017-07-22 LAB — IRON AND TIBC
Iron: 92 ug/dL (ref 41–142)
SATURATION RATIOS: 38 % (ref 21–57)
TIBC: 243 ug/dL (ref 236–444)
UIBC: 151 ug/dL

## 2017-07-22 LAB — FERRITIN: Ferritin: 169 ng/mL (ref 9–269)

## 2017-07-25 ENCOUNTER — Encounter: Payer: Self-pay | Admitting: Internal Medicine

## 2017-07-25 DIAGNOSIS — R945 Abnormal results of liver function studies: Secondary | ICD-10-CM

## 2017-07-25 DIAGNOSIS — R7989 Other specified abnormal findings of blood chemistry: Secondary | ICD-10-CM

## 2017-07-26 NOTE — Telephone Encounter (Signed)
Order to d/c voltaren gel Done hardcopy to shirron

## 2017-07-26 NOTE — Addendum Note (Signed)
Addended by: Biagio Borg on: 07/26/2017 08:16 AM   Modules accepted: Orders

## 2017-08-08 ENCOUNTER — Other Ambulatory Visit (INDEPENDENT_AMBULATORY_CARE_PROVIDER_SITE_OTHER): Payer: Medicare Other

## 2017-08-08 DIAGNOSIS — R945 Abnormal results of liver function studies: Secondary | ICD-10-CM

## 2017-08-08 DIAGNOSIS — R7989 Other specified abnormal findings of blood chemistry: Secondary | ICD-10-CM

## 2017-08-08 LAB — HEPATIC FUNCTION PANEL
ALK PHOS: 102 U/L (ref 39–117)
ALT: 11 U/L (ref 0–35)
AST: 14 U/L (ref 0–37)
Albumin: 3.5 g/dL (ref 3.5–5.2)
BILIRUBIN DIRECT: 0.1 mg/dL (ref 0.0–0.3)
BILIRUBIN TOTAL: 0.3 mg/dL (ref 0.2–1.2)
TOTAL PROTEIN: 6.1 g/dL (ref 6.0–8.3)

## 2017-08-09 ENCOUNTER — Encounter: Payer: Self-pay | Admitting: Internal Medicine

## 2017-08-09 MED ORDER — ACETAMINOPHEN-CODEINE 300-30 MG PO TABS: 1.0000 | ORAL_TABLET | Freq: Four times a day (QID) | ORAL | 0 refills | Status: DC | PRN

## 2017-08-09 NOTE — Telephone Encounter (Signed)
[facility]ron to fax order to facility

## 2017-08-12 ENCOUNTER — Encounter: Payer: Self-pay | Admitting: Internal Medicine

## 2017-08-15 ENCOUNTER — Other Ambulatory Visit: Payer: Self-pay | Admitting: Internal Medicine

## 2017-08-18 ENCOUNTER — Encounter: Payer: Self-pay | Admitting: Internal Medicine

## 2017-08-18 DIAGNOSIS — R05 Cough: Secondary | ICD-10-CM

## 2017-08-18 DIAGNOSIS — Q396 Congenital diverticulum of esophagus: Secondary | ICD-10-CM

## 2017-08-18 DIAGNOSIS — R059 Cough, unspecified: Secondary | ICD-10-CM

## 2017-08-23 ENCOUNTER — Encounter (INDEPENDENT_AMBULATORY_CARE_PROVIDER_SITE_OTHER): Payer: Self-pay

## 2017-08-24 ENCOUNTER — Ambulatory Visit (INDEPENDENT_AMBULATORY_CARE_PROVIDER_SITE_OTHER)
Admission: RE | Admit: 2017-08-24 | Discharge: 2017-08-24 | Disposition: A | Discharge status: Home / Self Care | Payer: Medicare Other | Source: Ambulatory Visit | Attending: Internal Medicine | Admitting: Internal Medicine

## 2017-08-24 DIAGNOSIS — R05 Cough: Secondary | ICD-10-CM | POA: Diagnosis not present

## 2017-08-24 DIAGNOSIS — R059 Cough, unspecified: Secondary | ICD-10-CM

## 2017-08-24 MED ORDER — GUAIFENESIN ER 600 MG PO TB12: 600.0000 mg | ORAL_TABLET | Freq: Two times a day (BID) | ORAL | 1 refills | Status: DC | PRN

## 2017-08-24 NOTE — Addendum Note (Signed)
Addended by: Biagio Borg on: 08/24/2017 12:31 PM   Modules accepted: Orders

## 2017-08-24 NOTE — Telephone Encounter (Signed)
shirron to fax mucinex rx to facility please

## 2017-08-25 ENCOUNTER — Other Ambulatory Visit: Payer: Medicare Other

## 2017-08-25 ENCOUNTER — Ambulatory Visit: Payer: Medicare Other | Admitting: Family

## 2017-08-26 NOTE — Addendum Note (Signed)
Addended by: Biagio Borg on: 08/26/2017 08:54 AM   Modules accepted: Orders

## 2017-08-31 ENCOUNTER — Telehealth: Payer: Self-pay

## 2017-08-31 NOTE — Telephone Encounter (Signed)
Order will be signed and faxed back tomorrow. It is in PCP forms to review.    Copied from [place] [id number]. Topic: Inquiry >> Aug 31, 2017  2:44 PM Allison Page wrote: Reason for CRM: [facility] called to get OV notes and a diagnosis, contact [phone] ext [phone] if needed

## 2017-09-01 ENCOUNTER — Other Ambulatory Visit: Payer: Self-pay | Admitting: Internal Medicine

## 2017-09-01 MED ORDER — BLOOD GLUCOSE TEST VI STRP: ORAL_STRIP | 1 refills | Status: DC

## 2017-09-02 ENCOUNTER — Other Ambulatory Visit: Payer: Medicare Other

## 2017-09-02 ENCOUNTER — Ambulatory Visit: Payer: Medicare Other | Admitting: Hematology & Oncology

## 2017-09-06 ENCOUNTER — Inpatient Hospital Stay: Payer: Medicare Other | Attending: Hematology & Oncology

## 2017-09-06 ENCOUNTER — Other Ambulatory Visit: Payer: Self-pay

## 2017-09-06 ENCOUNTER — Inpatient Hospital Stay (HOSPITAL_BASED_OUTPATIENT_CLINIC_OR_DEPARTMENT_OTHER): Payer: Medicare Other | Admitting: Family

## 2017-09-06 ENCOUNTER — Encounter: Payer: Self-pay | Admitting: Family

## 2017-09-06 VITALS — HR 58 | Temp 98.0°F | Resp 20 | Wt 113.0 lb

## 2017-09-06 DIAGNOSIS — M1990 Unspecified osteoarthritis, unspecified site: Secondary | ICD-10-CM | POA: Insufficient documentation

## 2017-09-06 DIAGNOSIS — Z862 Personal history of diseases of the blood and blood-forming organs and certain disorders involving the immune mechanism: Secondary | ICD-10-CM | POA: Diagnosis not present

## 2017-09-06 DIAGNOSIS — R030 Elevated blood-pressure reading, without diagnosis of hypertension: Secondary | ICD-10-CM | POA: Diagnosis not present

## 2017-09-06 DIAGNOSIS — N183 Chronic kidney disease, stage 3 unspecified: Secondary | ICD-10-CM

## 2017-09-06 DIAGNOSIS — R000 Tachycardia, unspecified: Secondary | ICD-10-CM | POA: Insufficient documentation

## 2017-09-06 DIAGNOSIS — D631 Anemia in chronic kidney disease: Secondary | ICD-10-CM | POA: Insufficient documentation

## 2017-09-06 DIAGNOSIS — D509 Iron deficiency anemia, unspecified: Secondary | ICD-10-CM

## 2017-09-06 DIAGNOSIS — N189 Chronic kidney disease, unspecified: Secondary | ICD-10-CM | POA: Diagnosis not present

## 2017-09-06 DIAGNOSIS — D500 Iron deficiency anemia secondary to blood loss (chronic): Secondary | ICD-10-CM

## 2017-09-06 LAB — CMP (CANCER CENTER ONLY)
ALK PHOS: 144 U/L — AB (ref 38–126)
ALT: 24 U/L (ref 0–44)
ANION GAP: 10 (ref 5–15)
AST: 31 U/L (ref 15–41)
Albumin: 3.5 g/dL (ref 3.5–5.0)
BILIRUBIN TOTAL: 0.2 mg/dL — AB (ref 0.3–1.2)
BUN: 20 mg/dL (ref 8–23)
CALCIUM: 11.4 mg/dL — AB (ref 8.9–10.3)
CO2: 24 mmol/L (ref 22–32)
CREATININE: 1.72 mg/dL — AB (ref 0.44–1.00)
Chloride: 105 mmol/L (ref 98–111)
GFR, EST AFRICAN AMERICAN: 28 mL/min — AB (ref >60)
GFR, EST NON AFRICAN AMERICAN: 24 mL/min — AB (ref >60)
Glucose, Bld: 138 mg/dL — ABNORMAL HIGH (ref 70–99)
Potassium: 4.4 mmol/L (ref 3.5–5.1)
SODIUM: 139 mmol/L (ref 135–145)
TOTAL PROTEIN: 6.6 g/dL (ref 6.5–8.1)

## 2017-09-06 LAB — CBC WITH DIFFERENTIAL (CANCER CENTER ONLY)
BLASTS: 0 %
Band Neutrophils: 0 %
Basophils Absolute: 0 10*3/uL (ref 0.0–0.1)
Basophils Relative: 0 %
EOS PCT: 2 %
Eosinophils Absolute: 0.1 10*3/uL (ref 0.0–0.5)
HCT: 26.5 % — ABNORMAL LOW (ref 34.8–46.6)
HEMOGLOBIN: 8.4 g/dL — AB (ref 11.6–15.9)
LYMPHS ABS: 0.9 10*3/uL (ref 0.9–3.3)
Lymphocytes Relative: 28 %
MCH: 33.7 pg (ref 26.0–34.0)
MCHC: 31.7 g/dL — AB (ref 32.0–36.0)
MCV: 106.4 fL — AB (ref 81.0–101.0)
MYELOCYTES: 0 %
Metamyelocytes Relative: 0 %
Monocytes Absolute: 0.1 10*3/uL (ref 0.1–0.9)
Monocytes Relative: 4 %
NEUTROS PCT: 66 %
NRBC: 0 /100{WBCs}
Neutro Abs: 2 10*3/uL (ref 1.5–6.5)
Other: 0 %
Platelet Count: 289 10*3/uL (ref 145–400)
Promyelocytes Relative: 0 %
RBC: 2.49 MIL/uL — AB (ref 3.70–5.32)
RDW: 14.4 % (ref 11.1–15.7)
WBC: 3.1 10*3/uL — AB (ref 3.9–10.0)

## 2017-09-06 NOTE — Progress Notes (Signed)
Hematology and Oncology Follow Up Visit  Allison Page [id number] 06/04/1922 84 y.o. 09/06/2017   Principle Diagnosis:  Anemia of renal insufficiency Iron deficiency anemia  Current Therapy:   IV iron as indicated Aranesp 300 g subcutaneous for hemoglobin less than 10   Interim History: Allison Page is here today with her daughter for follow-up. She is very frustrated with her persistent arthritis pain. This is not controlled with Tramadol. She stopped her Votaren gel (which was helping tremendously) due to elevated LFT's. She states that it was put on in "globs" to 4 different areas of her body which she feels may have been too much. I told her she could try to use it again in moderation if her PCP was ok with reordering and monitoring her LFT's regularly. Her BP is high at 178/54, HR 113 due to the pain. She is quite discouraged by this. Unfortunately with her vitals being that high, confirmed manually, we are unable to give her Aranesp today. Hgb is 8.4.   She does not sleep well in her bed and has to sleep in a a recliner due to the pain. No fever, chills, n/v, cough, rash, dizziness, SOB, chest pain, palpitations, abdominal pain or changes in bowel or bladder habits.  She takes Miralax as needed for constipation.  She is quite [facility] and has lost her hearing aids. She goes next week on Monday to be fitted for new ones.  No numbness or tingling in her extremities. The puffiness in her ankles is unchanged.  No lymphadenopathy noted on exam.  She does not have much of an appetite but states that she is hydrating well. Her weight is down 4 lbs since her last visit.  She states that her blood sugars have been well controlled.   ECOG Performance Status: 1 - Symptomatic but completely ambulatory  Medications:  Allergies as of 09/06/2017      Reactions   Allegra Allergy [fexofenadine Hcl] Anaphylaxis   Amoxicillin    Penicillins    Sulfa Antibiotics       Medication List        Accurate as of 09/06/17  3:53 PM. Always use your most recent med list.          acetaminophen 500 MG tablet Commonly known as:  TYLENOL Take 1 tablet (500 mg total) by mouth every 6 (six) hours as needed.   Acetaminophen-Codeine 300-30 MG tablet Commonly known as:  TYLENOL/CODEINE #3 Take 1 tablet by mouth every 6 (six) hours as needed for pain.   aluminum-magnesium hydroxide-simethicone 580-998-33 MG/5ML Susp Commonly known as:  MAALOX 2 teaspoons by mouth 3 times daily as needed for heartburn   amLODipine 10 MG tablet Commonly known as:  NORVASC TAKE (1/2) TABLET BY MOUTH ONCE DAILY.   aspirin 81 MG tablet TAKE ONE TABLET BY MOUTH ONCE DAILY.   atorvastatin 20 MG tablet Commonly known as:  LIPITOR TAKE (1) TABLET BY MOUTH AT BEDTIME.   BLOOD GLUCOSE TEST STRIPS Strp Use as directed once weekly E11.9   clindamycin 150 MG capsule Commonly known as:  CLEOCIN 4 by mouth 1 hour prior to dental procedures   conjugated estrogens vaginal cream Commonly known as:  PREMARIN Place 1 Applicatorful vaginally daily.   darifenacin 7.5 MG 24 hr tablet Commonly known as:  ENABLEX Take 1 tablet (7.5 mg total) by mouth daily.   donepezil 10 MG tablet Commonly known as:  ARICEPT Take 1 tablet (10 mg total) by mouth daily.   guaiFENesin 600  MG 12 hr tablet Commonly known as:  MUCINEX Take 1 tablet (600 mg total) by mouth 2 (two) times daily as needed.   hydrALAZINE 50 MG tablet Commonly known as:  APRESOLINE Take 1 tablet (50 mg total) by mouth 3 (three) times daily.   hydrALAZINE 50 MG tablet Commonly known as:  APRESOLINE TAKE 1 TABLET BY MOUTH THREE TIMES A DAY.   levothyroxine 100 MCG tablet Commonly known as:  SYNTHROID, LEVOTHROID TAKE ONE TABLET BY MOUTH ONCE DAILY.   magnesium oxide 400 MG tablet Commonly known as:  MAG-OX Take 400 mg by mouth daily.   Melatonin 3 MG Tabs Take 1 tablet (3 mg total) by mouth at bedtime.   metoprolol succinate 25 MG 24 hr  tablet Commonly known as:  TOPROL XL Take 1 tablet (25 mg total) by mouth daily.   NASACORT ALLERGY 24HR 55 MCG/ACT Aero nasal inhaler Generic drug:  triamcinolone SPRAY 2 SPRAYS INTO EACH NOSTRIL DAILY.   oseltamivir 75 MG capsule Commonly known as:  TAMIFLU Take 1 capsule (75 mg total) by mouth 2 (two) times daily.   pantoprazole 40 MG tablet Commonly known as:  PROTONIX TAKE (1) TABLET BY MOUTH TWICE DAILY 30-60 MINUTES BEFORE BREAKFAST AND DINNER.   polyethylene glycol packet Commonly known as:  MIRALAX / GLYCOLAX Take 17 g by mouth daily.   GAVILAX powder Generic drug:  polyethylene glycol powder MIX 1 CAPFUL (17G) IN 8 OUNCES OF JUICE/WATER AND DRINK ONCE DAILY.   senna-docusate 8.6-50 MG tablet Commonly known as:  Senokot-S Take 1-2 tablets, IF NEEDED, every 12 hrs for constipation   traMADol 50 MG tablet Commonly known as:  ULTRAM TAKE (1) TABLET BY MOUTH 4 TIMES DAILY.   triamcinolone cream 0.1 % Commonly known as:  KENALOG Apply 1 application topically 2 (two) times daily as needed.   vitamin B-12 1000 MCG tablet Commonly known as:  CYANOCOBALAMIN TAKE ONE TABLET BY MOUTH ONCE DAILY.       Allergies:  Allergies  Allergen Reactions  . Allegra Allergy [Fexofenadine Hcl] Anaphylaxis  . Amoxicillin   . Penicillins   . Sulfa Antibiotics     Past Medical History, Surgical history, Social history, and Family History were reviewed and updated.  Review of Systems: All other 10 point review of systems is negative.   Physical Exam:  weight is 113 lb (51.3 kg). Her oral temperature is 98 F (36.7 C). Her pulse is 58 (abnormal). Her respiration is 20 and oxygen saturation is 98%.   Wt Readings from Last 3 Encounters:  09/06/17 113 lb (51.3 kg)  07/21/17 117 lb (53.1 kg)  06/16/17 116 lb (52.6 kg)    Ocular: Sclerae unicteric, pupils equal, round and reactive to light Ear-nose-throat: Oropharynx clear, dentition fair Lymphatic: No cervical,  supraclavicular or axillary adenopathy Lungs no rales or rhonchi, good excursion bilaterally Heart regular rate and rhythm, no murmur appreciated Abd soft, nontender, positive bowel sounds, no liver or spleen palpated on exam, no fluid wave  MSK no focal spinal tenderness, no joint edema Neuro: non-focal, well-oriented, appropriate affect Breasts: Deferred   Lab Results  Component Value Date   WBC 3.1 (L) 09/06/2017   HGB 8.4 (L) 09/06/2017   HCT 26.5 (L) 09/06/2017   MCV 106.4 (H) 09/06/2017   PLT 289 09/06/2017   Lab Results  Component Value Date   FERRITIN 169 07/21/2017   IRON 92 07/21/2017   TIBC 243 07/21/2017   UIBC 151 07/21/2017   IRONPCTSAT 38 07/21/2017  Lab Results  Component Value Date   RETICCTPCT 1.5 04/25/2017   RBC 2.49 (L) 09/06/2017   RETICCTABS 60.5 12/13/2014   No results found for: KPAFRELGTCHN, LAMBDASER, KAPLAMBRATIO No results found for: IGGSERUM, IGA, IGMSERUM No results found for: Odetta Pink, SPEI   Chemistry      Component Value Date/Time   NA 144 07/21/2017 1407   NA 141 11/29/2016 1314   K 4.8 (H) 07/21/2017 1407   K 3.9 11/29/2016 1314   CL 110 (H) 07/21/2017 1407   CL 106 11/29/2016 1314   CO2 27 07/21/2017 1407   CO2 27 11/29/2016 1314   BUN 24 (H) 07/21/2017 1407   BUN 28 (H) 11/29/2016 1314   CREATININE 1.70 (H) 07/21/2017 1407   CREATININE 2.1 (H) 11/29/2016 1314      Component Value Date/Time   CALCIUM 10.0 07/21/2017 1407   CALCIUM 10.5 (H) 11/29/2016 1314   ALKPHOS 102 08/08/2017 1405   ALKPHOS 63 11/29/2016 1314   AST 14 08/08/2017 1405   AST 66 (H) 07/21/2017 1407   ALT 11 08/08/2017 1405   ALT 50 (H) 07/21/2017 1407   ALT 19 11/29/2016 1314   BILITOT 0.3 08/08/2017 1405   BILITOT 0.5 07/21/2017 1407      Impression and Plan: Ms. Hattabaugh is a very pleasant 84 yo caucasian female with both iron deficiency and anemia of chronic renal insufficiency. Her  biggest complaint at this time is her persistent pain due to arthritis. She is not eating or sleeping well because of this. Also with her elevated BP and HR we are unable to give her Aranesp for Hgb of 8.4.  I will route my note from today to her PCP for further evaluation.  We will see her back in another 2 week to follow-up and Aranesp if her vitals have improved.  They will contact our office with any questions or concerns. We can certainly see her sooner if need be.   Laverna Peace, NP 8/6/20193:53 PM

## 2017-09-07 ENCOUNTER — Telehealth: Payer: Self-pay | Admitting: Internal Medicine

## 2017-09-07 LAB — IRON AND TIBC
Iron: 102 ug/dL (ref 41–142)
SATURATION RATIOS: 43 % (ref 21–57)
TIBC: 238 ug/dL (ref 236–444)
UIBC: 136 ug/dL

## 2017-09-07 LAB — FERRITIN: Ferritin: 127 ng/mL (ref 11–307)

## 2017-09-07 NOTE — Telephone Encounter (Signed)
Called to give verbals. No answer. Will try again later.

## 2017-09-07 NOTE — Telephone Encounter (Signed)
Ok for verbals 

## 2017-09-07 NOTE — Telephone Encounter (Signed)
Copied from [place] [id number]. Topic: Inquiry >> Sep 07, 2017 10:21 AM Oliver Pila B wrote: Reason for CRM: home heatlh pt was started today but further consultations are needed and are needing verbal approval; contact [facility] [phone]; leave a message if needed

## 2017-09-08 NOTE — Telephone Encounter (Signed)
Verbal orders given for  PT 2/week this week, then hold for 1 week, then  PT 2/week x 6 and Speech therapy.

## 2017-09-10 ENCOUNTER — Encounter: Payer: Self-pay | Admitting: Internal Medicine

## 2017-09-13 ENCOUNTER — Ambulatory Visit: Payer: Self-pay | Admitting: *Deleted

## 2017-09-13 ENCOUNTER — Other Ambulatory Visit: Payer: Self-pay

## 2017-09-13 ENCOUNTER — Other Ambulatory Visit: Payer: Self-pay | Admitting: Internal Medicine

## 2017-09-13 ENCOUNTER — Encounter (HOSPITAL_COMMUNITY): Payer: Self-pay

## 2017-09-13 ENCOUNTER — Emergency Department (HOSPITAL_COMMUNITY): Payer: Medicare Other

## 2017-09-13 ENCOUNTER — Encounter: Payer: Self-pay | Admitting: Internal Medicine

## 2017-09-13 ENCOUNTER — Emergency Department (HOSPITAL_COMMUNITY)
Admission: EM | Admit: 2017-09-13 | Discharge: 2017-09-13 | Disposition: A | Discharge status: Home / Self Care | Payer: Medicare Other | Attending: Emergency Medicine | Admitting: Emergency Medicine

## 2017-09-13 DIAGNOSIS — S0990XA Unspecified injury of head, initial encounter: Secondary | ICD-10-CM | POA: Diagnosis not present

## 2017-09-13 DIAGNOSIS — S161XXA Strain of muscle, fascia and tendon at neck level, initial encounter: Secondary | ICD-10-CM

## 2017-09-13 DIAGNOSIS — Y999 Unspecified external cause status: Secondary | ICD-10-CM | POA: Insufficient documentation

## 2017-09-13 DIAGNOSIS — Y92199 Unspecified place in other specified residential institution as the place of occurrence of the external cause: Secondary | ICD-10-CM | POA: Insufficient documentation

## 2017-09-13 DIAGNOSIS — F0390 Unspecified dementia without behavioral disturbance: Secondary | ICD-10-CM | POA: Insufficient documentation

## 2017-09-13 DIAGNOSIS — W19XXXA Unspecified fall, initial encounter: Secondary | ICD-10-CM | POA: Diagnosis not present

## 2017-09-13 DIAGNOSIS — S169XXA Unspecified injury of muscle, fascia and tendon at neck level, initial encounter: Secondary | ICD-10-CM | POA: Diagnosis present

## 2017-09-13 DIAGNOSIS — Y939 Activity, unspecified: Secondary | ICD-10-CM | POA: Insufficient documentation

## 2017-09-13 LAB — URINALYSIS, ROUTINE W REFLEX MICROSCOPIC
BACTERIA UA: NONE SEEN
Bilirubin Urine: NEGATIVE
GLUCOSE, UA: NEGATIVE mg/dL
Hgb urine dipstick: NEGATIVE
Ketones, ur: NEGATIVE mg/dL
Leukocytes, UA: NEGATIVE
NITRITE: NEGATIVE
Protein, ur: 100 mg/dL — AB
Specific Gravity, Urine: 1.008 (ref 1.005–1.030)
pH: 8 (ref 5.0–8.0)

## 2017-09-13 LAB — I-STAT CHEM 8, ED
BUN: 31 mg/dL — ABNORMAL HIGH (ref 8–23)
CREATININE: 1.9 mg/dL — AB (ref 0.44–1.00)
Calcium, Ion: 1.59 mmol/L (ref 1.15–1.40)
Chloride: 102 mmol/L (ref 98–111)
Glucose, Bld: 123 mg/dL — ABNORMAL HIGH (ref 70–99)
HCT: 28 % — ABNORMAL LOW (ref 36.0–46.0)
Hemoglobin: 9.5 g/dL — ABNORMAL LOW (ref 12.0–15.0)
Potassium: 3.9 mmol/L (ref 3.5–5.1)
Sodium: 136 mmol/L (ref 135–145)
TCO2: 27 mmol/L (ref 22–32)

## 2017-09-13 MED ORDER — PREDNISONE 10 MG PO TABS: 20.0000 mg | ORAL_TABLET | Freq: Every day | ORAL | 0 refills | Status: DC

## 2017-09-13 NOTE — ED Provider Notes (Addendum)
Patient's care signed out to follow-up CT scan and urinalysis.  CT scan showed arthritis but no acute fracture or bleeding.  Urinalysis no sign of infection.  No fever in the ER.  Patient stable for close outpatient follow-up. Discussed results with patient and patient's family member.  They are concerned that she has been having worsening arthritis symptoms which sometimes leads to her falls.  Unfortunately her kidney function is not normal so she cannot use NSAIDs.  She is a diabetic typically controlled.  We discussed a low-dose prednisone for 3 to 4 days to see if it would help her.  Discussed risks and benefits and follow-up with primary doctor.  Discussed staying hydrated with oral fluids. Golda Acre, MD 09/13/17 1727    Elnora Morrison, MD 09/13/17 [phone]

## 2017-09-13 NOTE — Discharge Instructions (Signed)
We saw you in the ER after you had a fall. °All the imaging results are normal, no fractures seen. No evidence of brain bleed. °Please be very careful with walking, and do everything possible to prevent falls. ° ° °

## 2017-09-13 NOTE — ED Notes (Signed)
Attempted X2 to get pt blood was unsuccessful

## 2017-09-13 NOTE — ED Notes (Signed)
Patient transported to CT 

## 2017-09-13 NOTE — ED Provider Notes (Signed)
[facility] Provider Note   CSN: [id number] Arrival date & time: 09/13/17  1349     History   Chief Complaint Chief Complaint  Patient presents with  . Fall    HPI Allison Page is a 84 y.o. female.  HPI  84 year old female with history of dementia comes in with chief complaint of fall. According to EMS report, patient had 2 falls yesterday and as per history provided to them by the facility, patient has been acting more confused today.  Patient is alert and oriented x3.  She reports that she might have fallen yesterday because she has arthritis.  She does not recollect circumstances around her fall.  Patient does complain of headache and neck pain, but she denies any nausea, vomiting, vision changes.   Past Medical History:  Diagnosis Date  . Acid reflux   . Anemia of chronic renal failure, stage 3 (moderate) ([place]) 03/21/2014  . Arthritis   . Dementia 11/19/2014  . Diabetes ([place])   . High blood pressure   . High cholesterol   . Hypothyroidism   . Iron deficiency anemia 03/21/2014  . TIA (transient ischemic attack)     Patient Active Problem List   Diagnosis Date Noted  . Degenerative joint disease of knee, right 05/31/2017  . Vitamin D deficiency 02/09/2017  . Rash 02/09/2017  . Low back pain 02/09/2017  . Elevated PTHrP level 09/28/2016  . Cough 07/14/2016  . Right leg swelling 03/30/2016  . Dysphagia 03/30/2016  . Urinary incontinence 03/30/2016  . Chronic pain 03/30/2016  . Bilateral shoulder pain 03/30/2016  . Left lumbar radiculitis 02/06/2015  . Moderate dementia without behavioral disturbance 01/01/2015  . Essential hypertension 01/01/2015  . Other specified hypothyroidism 01/01/2015  . Type 2 diabetes mellitus without complication, without long-term current use of insulin ([place]) 01/01/2015  . Rotator cuff tear arthropathy of both shoulders 12/09/2014  . Dementia 11/19/2014  . Constipation 11/19/2014  . Right  shoulder pain 11/19/2014  . Preventative health care 09/05/2014  . Hyperlipidemia 09/05/2014  . Urinary frequency 09/05/2014  . Allergic rhinitis 09/05/2014  . Hypothyroidism 03/22/2014  . TIA (transient ischemic attack) 03/22/2014  . DM type 2 (diabetes mellitus, type 2) ([place]) 03/22/2014  . GERD (gastroesophageal reflux disease) 03/22/2014  . Essential hypertension, benign 03/22/2014  . Neck pain, chronic 03/22/2014  . Diverticulosis of colon without hemorrhage 03/22/2014  . Iron deficiency anemia 03/21/2014  . Anemia of chronic renal failure, stage 3 (moderate) ([place]) 03/21/2014    Past Surgical History:  Procedure Laterality Date  . ANTERIOR (CYSTOCELE) AND POSTERIOR REPAIR (RECTOCELE) WITH XENFORM GRAFT AND SACROSPINOUS FIXATION    . BLADDER SUSPENSION    . BREAST BIOPSY    . CATARACT EXTRACTION    . CHOLECYSTECTOMY  2013  . COLONOSCOPY  05/02/2007  . ERCP  2014  . TOTAL HIP ARTHROPLASTY Left 05/06/2008  . VAGINAL HYSTERECTOMY       OB History   None      Home Medications    Prior to Admission medications   Medication Sig Start Date End Date Taking? Authorizing Provider  acetaminophen (TYLENOL) 500 MG tablet Take 1 tablet (500 mg total) by mouth every 6 (six) hours as needed. 08/06/15   Biagio Borg, MD  Acetaminophen-Codeine (TYLENOL/CODEINE #3) 300-30 MG tablet Take 1 tablet by mouth every 6 (six) hours as needed for pain. 08/09/17   Biagio Borg, MD  aluminum-magnesium hydroxide-simethicone (MAALOX) 200-200-20 MG/5ML SUSP 2 teaspoons by mouth  3 times daily as needed for heartburn    [provider]  amLODipine (NORVASC) 10 MG tablet TAKE (1/2) TABLET BY MOUTH ONCE DAILY. 08/02/16   Biagio Borg, MD  aspirin 81 MG tablet TAKE ONE TABLET BY MOUTH ONCE DAILY. 08/12/16   Biagio Borg, MD  atorvastatin (LIPITOR) 20 MG tablet TAKE (1) TABLET BY MOUTH AT BEDTIME. 08/02/16   Biagio Borg, MD  Cholecalciferol (VITAMIN D) 2000 units tablet TAKE (1) TABLET BY MOUTH ONCE  DAILY. 09/13/17   Biagio Borg, MD  clindamycin (CLEOCIN) 150 MG capsule 4 by mouth 1 hour prior to dental procedures 09/05/14   Biagio Borg, MD  conjugated estrogens (PREMARIN) vaginal cream Place 1 Applicatorful vaginally daily. 03/30/16   Biagio Borg, MD  darifenacin (ENABLEX) 7.5 MG 24 hr tablet Take 1 tablet (7.5 mg total) by mouth daily. 05/01/15   Biagio Borg, MD  donepezil (ARICEPT) 10 MG tablet Take 1 tablet (10 mg total) by mouth daily. 06/10/17   Cameron Sprang, MD  GAVILAX powder MIX 1 CAPFUL (17G) IN 8 OUNCES OF JUICE/WATER AND DRINK ONCE DAILY. 07/19/17   Biagio Borg, MD  Glucose Blood (BLOOD GLUCOSE TEST STRIPS) STRP Use as directed once weekly E11.9 09/01/17   Biagio Borg, MD  guaiFENesin (MUCINEX) 600 MG 12 hr tablet Take 1 tablet (600 mg total) by mouth 2 (two) times daily as needed. 08/24/17   Biagio Borg, MD  hydrALAZINE (APRESOLINE) 50 MG tablet Take 1 tablet (50 mg total) by mouth 3 (three) times daily. 08/26/16   Biagio Borg, MD  hydrALAZINE (APRESOLINE) 50 MG tablet TAKE 1 TABLET BY MOUTH THREE TIMES A DAY. 08/16/17   Biagio Borg, MD  levothyroxine (SYNTHROID, LEVOTHROID) 100 MCG tablet TAKE ONE TABLET BY MOUTH ONCE DAILY. 07/28/16   Biagio Borg, MD  magnesium oxide (MAG-OX) 400 MG tablet Take 400 mg by mouth daily.    [provider]  Melatonin 3 MG TABS TAKE (1) TABLET BY MOUTH AT BEDTIME. 09/13/17   Biagio Borg, MD  metoprolol succinate (TOPROL XL) 25 MG 24 hr tablet Take 1 tablet (25 mg total) by mouth daily. 12/03/16   Biagio Borg, MD  NASACORT ALLERGY 24HR 55 MCG/ACT AERO nasal inhaler SPRAY 2 SPRAYS INTO EACH NOSTRIL DAILY. 08/02/16   Biagio Borg, MD  oseltamivir (TAMIFLU) 75 MG capsule Take 1 capsule (75 mg total) by mouth 2 (two) times daily. 03/14/17   Biagio Borg, MD  pantoprazole (PROTONIX) 40 MG tablet TAKE (1) TABLET BY MOUTH TWICE DAILY 30-60 MINUTES BEFORE BREAKFAST AND DINNER. 08/16/16   Biagio Borg, MD  polyethylene glycol [facility] /  Floria Raveling) packet Take 17 g by mouth daily. 09/28/16   Biagio Borg, MD  senna-docusate (SENOKOT-S) 8.6-50 MG per tablet Take 1-2 tablets, IF NEEDED, every 12 hrs for constipation 04/11/14   Volanda Napoleon, MD  traMADol (ULTRAM) 50 MG tablet TAKE (1) TABLET BY MOUTH 4 TIMES DAILY. 06/06/17   Biagio Borg, MD  triamcinolone cream (KENALOG) 0.1 % Apply 1 application topically 2 (two) times daily as needed. Patient not taking: Reported on 09/06/2017 02/09/17 02/09/18  Biagio Borg, MD  vitamin B-12 (CYANOCOBALAMIN) 1000 MCG tablet TAKE ONE TABLET BY MOUTH ONCE DAILY. 08/11/16   Biagio Borg, MD    Family History Family History  Problem Relation Age of Onset  . Heart attack Father   . Arthritis Mother   .  Cancer Sister   . Esophageal cancer Daughter   . Seizures Daughter        As a teenager  . Colon cancer Son     Social History Social History   Tobacco Use  . Smoking status: Former Smoker    Packs/day: 1.00    Years: 25.00    Pack years: 25.00    Types: Cigarettes    Last attempt to quit: 04/11/1970    Years since quitting: 47.4  . Smokeless tobacco: Never Used  . Tobacco comment: quit 42 years ago  Substance Use Topics  . Alcohol use: No    Alcohol/week: 0.0 standard drinks  . Drug use: No     Allergies   Allegra allergy [fexofenadine hcl]; Amoxicillin; Penicillins; and Sulfa antibiotics   Review of Systems Review of Systems  Unable to perform ROS: Dementia     Physical Exam Updated Vital Signs BP (!) 170/53   Pulse (!) 54   Temp 98.7 F (37.1 C) (Oral)   Resp 14   SpO2 94%   Physical Exam  Constitutional: She appears well-developed.  HENT:  Head: Normocephalic and atraumatic.  Eyes: EOM are normal.  Neck: Normal range of motion. Neck supple.  Cardiovascular: Normal rate.  Pulmonary/Chest: Effort normal.  Abdominal: Bowel sounds are normal.  Neurological: She is alert.  Skin: Skin is warm and dry.  Nursing note and vitals reviewed.   ED Treatments /  Results  Labs (all labs ordered are listed, but only abnormal results are displayed) Labs Reviewed  BASIC METABOLIC PANEL  CBC WITH DIFFERENTIAL/PLATELET  URINALYSIS, ROUTINE W REFLEX MICROSCOPIC    EKG None  Radiology No results found.  Procedures Procedures (including critical care time)  Medications Ordered in ED Medications - No data to display   Initial Impression / Assessment and Plan / ED Course  I have reviewed the triage vital signs and the nursing notes.  Pertinent labs & imaging results that were available during my care of the patient were reviewed by me and considered in my medical decision making (see chart for details).     84 year old female comes in with chief complaint of fall. Allegedly patient had confusion and was not acting right after her falls.  Patient is noted to be alert and she is oriented to time, place and self -but she does not recall circumstances around her fall.  She is complaining of headache and neck pain, rest of her body exam does not reveal any significant findings.  We will get a CT scan of her head and C-spine, which if negative we will discharge the patient. CBC and BMP have also been ordered to make sure there is no electrolyte derangements.  Final Clinical Impressions(s) / ED Diagnoses   Final diagnoses:  None    ED Discharge Orders    None       Varney Biles, MD 09/13/17 1515

## 2017-09-13 NOTE — ED Triage Notes (Addendum)
Per [facility], pt from [facility] where the pt had 2 witnessed falls yesterday and staff reports "the pt is not acting herself today" Pt has dementia but is axox4. Following all commands. No neuro deficits. Pt states "I have arthritis in both of my legs and that is why I fell twice"

## 2017-09-13 NOTE — Telephone Encounter (Signed)
Pt's daughter, [name], called because her mother fell yesterday twice; she said that the assisted living called letting her know that the pt is confused and having odd behavior ([place]); the confusion started this morning; recommendations made per nurse triage protocol to include calling EMS now; her daughter verbalizes understanding and will have the assisted living to call EMS; the pt is normally seen by Dr Cathlean Cower, [facility]; will route to office for notification of this encounter.     Reason for Disposition . [1] Difficult to awaken or acting confused (e.g., disoriented, slurred speech) AND [2] present now AND [3] has diabetes (diabetes mellitus)  Answer Assessment - Initial Assessment Questions 1. LEVEL OF CONSCIOUSNESS: "How is he (she, the patient) acting right now?" (e.g., alert-oriented, confused, lethargic, stuporous, comatose)     confused 2. ONSET: "When did the confusion start?"  (minutes, hours, days)     09/13/17 around 1000 3. PATTERN "Does this come and go, or has it been constant since it started?"  "Is it present now?"     constant 4. ALCOHOL or DRUGS: "Has he been drinking alcohol or taking any drugs?"      no 5. NARCOTIC MEDICATIONS: "Has he been receiving any narcotic medications?" (e.g., morphine, Vicodin)   Tramadol; has been taking for 3 years 6. CAUSE: "What do you think is causing the confusion?"      ?UTI 7. OTHER SYMPTOMS: "Are there any other symptoms?" (e.g., difficulty breathing, headache, fever, weakness)     "Pain from arthritis is causing her to have a hard time getting up"  Protocols used: CONFUSION - [facility]

## 2017-09-14 ENCOUNTER — Encounter

## 2017-09-14 ENCOUNTER — Ambulatory Visit: Payer: Medicare Other | Admitting: Physician Assistant

## 2017-09-15 ENCOUNTER — Telehealth: Payer: Self-pay | Admitting: *Deleted

## 2017-09-15 ENCOUNTER — Encounter: Payer: Self-pay | Admitting: Internal Medicine

## 2017-09-15 DIAGNOSIS — Z794 Long term (current) use of insulin: Principal | ICD-10-CM

## 2017-09-15 DIAGNOSIS — E1110 Type 2 diabetes mellitus with ketoacidosis without coma: Secondary | ICD-10-CM

## 2017-09-15 NOTE — Telephone Encounter (Signed)
ED CM returned a call to the patient's daughter, [name]. She states the patient's prescription for Prednisone has not been received by [facility]. She provided the telephone number for ED CM to contact [facility].   CM contacted [facility] and spoke with [place], [facility] She read from the patient's AVS and states they did not receive a prescription for Prednisone. Cassandra provided the fax number for [facility] [phone].   Faxed prescription to the attention of Cassandra at [facility]. Fax confirmation received.

## 2017-09-16 ENCOUNTER — Encounter: Payer: Self-pay | Admitting: Family

## 2017-09-16 ENCOUNTER — Telehealth: Payer: Self-pay | Admitting: Internal Medicine

## 2017-09-16 MED ORDER — METOPROLOL SUCCINATE ER 50 MG PO TB24: 50.0000 mg | ORAL_TABLET | Freq: Every day | ORAL | 3 refills | Status: DC

## 2017-09-16 NOTE — Telephone Encounter (Signed)
BP Readings from Last 3 Encounters:  09/13/17 (!) 160/58  07/21/17 (!) 148/50  06/16/17 (!) 158/43   Temp Readings from Last 3 Encounters:  09/13/17 98.7 F (37.1 C) (Oral)  09/06/17 98 F (36.7 C) (Oral)  07/21/17 98.3 F (36.8 C) (Oral)   BP Readings from Last 3 Encounters:  09/13/17 (!) 160/58  07/21/17 (!) 148/50  06/16/17 (!) 158/43   Pulse Readings from Last 3 Encounters:  09/13/17 62  09/06/17 (!) 58  07/21/17 62   Ok to increase the BB to 50 mg daily  Steph to contact very concerned daughter, and fax rx to her facility if needed

## 2017-09-16 NOTE — Telephone Encounter (Signed)
This has been faxed as requested

## 2017-09-16 NOTE — Telephone Encounter (Signed)
Done hardcopy to steph  We are now forced to fax the order as this is the only way, thanks

## 2017-09-16 NOTE — Telephone Encounter (Signed)
Santiago Glad with [facility] states that [facility] Fax Number for this order to be sent is [phone].

## 2017-09-16 NOTE — Telephone Encounter (Signed)
Copied from [place] [id number]. Topic: Quick Communication - See Telephone Encounter >> Sep 16, 2017  1:31 PM R[place]ford Nail, [place] wrote: CRM for notification. See Telephone encounter for: 09/16/17. Santiago Glad, speech therapist with [facility], calling and states that the facility that the patient is living at is needing a diet change order. Requesting to be changed to mechanical soft and diabetic diet. Please advise. Fax to send to facility.  CB#: [phone]

## 2017-09-18 ENCOUNTER — Emergency Department (HOSPITAL_COMMUNITY): Payer: Medicare Other

## 2017-09-18 ENCOUNTER — Emergency Department (HOSPITAL_COMMUNITY)
Admission: EM | Admit: 2017-09-18 | Discharge: 2017-09-19 | Disposition: A | Discharge status: Home / Self Care | Payer: Medicare Other | Attending: Emergency Medicine | Admitting: Emergency Medicine

## 2017-09-18 ENCOUNTER — Encounter (HOSPITAL_COMMUNITY): Payer: Self-pay | Admitting: Emergency Medicine

## 2017-09-18 DIAGNOSIS — E1122 Type 2 diabetes mellitus with diabetic chronic kidney disease: Secondary | ICD-10-CM | POA: Insufficient documentation

## 2017-09-18 DIAGNOSIS — Z79899 Other long term (current) drug therapy: Secondary | ICD-10-CM | POA: Diagnosis not present

## 2017-09-18 DIAGNOSIS — S0003XA Contusion of scalp, initial encounter: Secondary | ICD-10-CM | POA: Insufficient documentation

## 2017-09-18 DIAGNOSIS — S0990XA Unspecified injury of head, initial encounter: Secondary | ICD-10-CM | POA: Diagnosis present

## 2017-09-18 DIAGNOSIS — N183 Chronic kidney disease, stage 3 (moderate): Secondary | ICD-10-CM | POA: Insufficient documentation

## 2017-09-18 DIAGNOSIS — Z87891 Personal history of nicotine dependence: Secondary | ICD-10-CM | POA: Insufficient documentation

## 2017-09-18 DIAGNOSIS — Y999 Unspecified external cause status: Secondary | ICD-10-CM | POA: Diagnosis not present

## 2017-09-18 DIAGNOSIS — Y92129 Unspecified place in nursing home as the place of occurrence of the external cause: Secondary | ICD-10-CM | POA: Diagnosis not present

## 2017-09-18 DIAGNOSIS — Z7982 Long term (current) use of aspirin: Secondary | ICD-10-CM | POA: Insufficient documentation

## 2017-09-18 DIAGNOSIS — I129 Hypertensive chronic kidney disease with stage 1 through stage 4 chronic kidney disease, or unspecified chronic kidney disease: Secondary | ICD-10-CM | POA: Diagnosis not present

## 2017-09-18 DIAGNOSIS — R451 Restlessness and agitation: Secondary | ICD-10-CM | POA: Diagnosis not present

## 2017-09-18 DIAGNOSIS — Z96642 Presence of left artificial hip joint: Secondary | ICD-10-CM | POA: Insufficient documentation

## 2017-09-18 DIAGNOSIS — M25511 Pain in right shoulder: Secondary | ICD-10-CM | POA: Insufficient documentation

## 2017-09-18 DIAGNOSIS — F0390 Unspecified dementia without behavioral disturbance: Secondary | ICD-10-CM | POA: Diagnosis not present

## 2017-09-18 DIAGNOSIS — E039 Hypothyroidism, unspecified: Secondary | ICD-10-CM | POA: Insufficient documentation

## 2017-09-18 DIAGNOSIS — M25551 Pain in right hip: Secondary | ICD-10-CM | POA: Insufficient documentation

## 2017-09-18 DIAGNOSIS — R296 Repeated falls: Secondary | ICD-10-CM | POA: Insufficient documentation

## 2017-09-18 DIAGNOSIS — W19XXXA Unspecified fall, initial encounter: Secondary | ICD-10-CM

## 2017-09-18 DIAGNOSIS — W0110XA Fall on same level from slipping, tripping and stumbling with subsequent striking against unspecified object, initial encounter: Secondary | ICD-10-CM | POA: Diagnosis not present

## 2017-09-18 DIAGNOSIS — Y9301 Activity, walking, marching and hiking: Secondary | ICD-10-CM | POA: Diagnosis not present

## 2017-09-18 LAB — BASIC METABOLIC PANEL
Anion gap: 9 (ref 5–15)
BUN: 32 mg/dL — ABNORMAL HIGH (ref 8–23)
CHLORIDE: 101 mmol/L (ref 98–111)
CO2: 25 mmol/L (ref 22–32)
CREATININE: 1.96 mg/dL — AB (ref 0.44–1.00)
Calcium: 13.4 mg/dL (ref 8.9–10.3)
GFR calc non Af Amer: 21 mL/min — ABNORMAL LOW (ref >60)
GFR, EST AFRICAN AMERICAN: 24 mL/min — AB (ref >60)
Glucose, Bld: 163 mg/dL — ABNORMAL HIGH (ref 70–99)
Potassium: 4.4 mmol/L (ref 3.5–5.1)
SODIUM: 135 mmol/L (ref 135–145)

## 2017-09-18 LAB — CBC WITH DIFFERENTIAL/PLATELET
Basophils Absolute: 0 10*3/uL (ref 0.0–0.1)
Basophils Relative: 0 %
EOS PCT: 0 %
Eosinophils Absolute: 0 10*3/uL (ref 0.0–0.7)
HCT: 30.3 % — ABNORMAL LOW (ref 36.0–46.0)
HEMOGLOBIN: 9.5 g/dL — AB (ref 12.0–15.0)
LYMPHS PCT: 12 %
Lymphs Abs: 0.7 10*3/uL (ref 0.7–4.0)
MCH: 33.6 pg (ref 26.0–34.0)
MCHC: 31.4 g/dL (ref 30.0–36.0)
MCV: 107.1 fL — AB (ref 78.0–100.0)
MONO ABS: 0.2 10*3/uL (ref 0.1–1.0)
Monocytes Relative: 4 %
NEUTROS PCT: 84 %
Neutro Abs: 4.6 10*3/uL (ref 1.7–7.7)
PLATELETS: 453 10*3/uL — AB (ref 150–400)
RBC: 2.83 MIL/uL — AB (ref 3.87–5.11)
RDW: 14.3 % (ref 11.5–15.5)
WBC: 5.5 10*3/uL (ref 4.0–10.5)

## 2017-09-18 LAB — PROTIME-INR
INR: 0.81
Prothrombin Time: 11.1 seconds — ABNORMAL LOW (ref 11.4–15.2)

## 2017-09-18 LAB — URINALYSIS, ROUTINE W REFLEX MICROSCOPIC
Bilirubin Urine: NEGATIVE
Glucose, UA: 50 mg/dL — AB
HGB URINE DIPSTICK: NEGATIVE
Ketones, ur: NEGATIVE mg/dL
Leukocytes, UA: NEGATIVE
NITRITE: NEGATIVE
Protein, ur: 100 mg/dL — AB
SPECIFIC GRAVITY, URINE: 1.012 (ref 1.005–1.030)
pH: 7 (ref 5.0–8.0)

## 2017-09-18 LAB — CK: CK TOTAL: 59 U/L (ref 38–234)

## 2017-09-18 MED ORDER — LACTATED RINGERS IV SOLN
INTRAVENOUS | Status: DC
  Administered 2017-09-19: via INTRAVENOUS

## 2017-09-18 MED ORDER — MELATONIN 3 MG PO TABS
3.0000 mg | ORAL_TABLET | Freq: Once | ORAL | Status: AC
  Administered 2017-09-19: 3 mg via ORAL
  Filled 2017-09-18: qty 1

## 2017-09-18 MED ORDER — ACETAMINOPHEN 325 MG PO TABS: 325.0000 mg | ORAL_TABLET | Freq: Four times a day (QID) | ORAL | Status: DC | PRN

## 2017-09-18 MED ORDER — METOPROLOL SUCCINATE ER 25 MG PO TB24: 50.0000 mg | ORAL_TABLET | Freq: Every day | ORAL | Status: DC

## 2017-09-18 MED ORDER — TRAMADOL HCL 50 MG PO TABS
50.0000 mg | ORAL_TABLET | Freq: Three times a day (TID) | ORAL | Status: DC | PRN
  Administered 2017-09-19: 50 mg via ORAL
  Filled 2017-09-18: qty 1

## 2017-09-18 MED ORDER — FENTANYL CITRATE (PF) 100 MCG/2ML IJ SOLN
25.0000 ug | Freq: Once | INTRAMUSCULAR | Status: AC
  Administered 2017-09-18: 25 ug via INTRAVENOUS
  Filled 2017-09-18: qty 2

## 2017-09-18 MED ORDER — ONDANSETRON 4 MG PO TBDP: 4.0000 mg | ORAL_TABLET | Freq: Three times a day (TID) | ORAL | Status: DC | PRN

## 2017-09-18 MED ORDER — ACETAMINOPHEN 500 MG PO TABS
500.0000 mg | ORAL_TABLET | Freq: Once | ORAL | Status: DC
  Filled 2017-09-18: qty 1

## 2017-09-18 MED ORDER — DONEPEZIL HCL 5 MG PO TABS: 10.0000 mg | ORAL_TABLET | Freq: Every day | ORAL | Status: DC

## 2017-09-18 MED ORDER — LACTATED RINGERS IV BOLUS
1000.0000 mL | Freq: Once | INTRAVENOUS | Status: AC
  Administered 2017-09-19: 1000 mL via INTRAVENOUS

## 2017-09-18 NOTE — ED Triage Notes (Signed)
BIB EMS from [facility] @ [place] for unwitnessed fall X2 today. Pt presents with hematoma and abrasion to back of head. A/OX3, per facility pt at her baseline. Pt also reports lower back pain, R shoulder pain, gen weakness all week.

## 2017-09-18 NOTE — ED Notes (Addendum)
Pts daughter upset and questioning why pt is in hallway. Attempted to explain that we reserve rooms for pts that need to be placed on cardiac monitors and pt does not need to be on a cardiac monitor.

## 2017-09-18 NOTE — ED Provider Notes (Signed)
[facility]ERGENC[facility] DEPARTMENT Provider Note   CSN: [id number] Arrival date & time: 09/18/17  2013     History   Chief Complaint Chief Complaint  Patient presents with  . Fall    HPI Allison Page is a 84 y.o. female with dementia, arthritis, and hypertension who presents due to a fall earlier today.  She reportedly tripped while using her walker.  She struck her head.  She is unsure how long she was on the floor for.  She denies having chest pain or shortness of breath.  She denies loss of consciousness.  She denies neck pain.  She denies having nausea and vomiting.  She reports having pain in her right shoulder and right hip.  She also says that she has a wound on her scalp.  She does not take any blood thinners or anticoagulants.  HPI  Past Medical History:  Diagnosis Date  . Acid reflux   . Anemia of chronic renal failure, stage 3 (moderate) ([place]) 03/21/2014  . Arthritis   . Dementia 11/19/2014  . Diabetes ([place])   . High blood pressure   . High cholesterol   . Hypothyroidism   . Iron deficiency anemia 03/21/2014  . TIA (transient ischemic attack)     Patient Active Problem List   Diagnosis Date Noted  . Degenerative joint disease of knee, right 05/31/2017  . Vitamin D deficiency 02/09/2017  . Rash 02/09/2017  . Low back pain 02/09/2017  . Elevated PTHrP level 09/28/2016  . Cough 07/14/2016  . Right leg swelling 03/30/2016  . Dysphagia 03/30/2016  . Urinary incontinence 03/30/2016  . Chronic pain 03/30/2016  . Bilateral shoulder pain 03/30/2016  . Left lumbar radiculitis 02/06/2015  . Moderate dementia without behavioral disturbance 01/01/2015  . Essential hypertension 01/01/2015  . Other specified hypothyroidism 01/01/2015  . Type 2 diabetes mellitus without complication, without long-term current use of insulin ([place]) 01/01/2015  . Rotator cuff tear arthropathy of both shoulders 12/09/2014  . Dementia 11/19/2014  . Constipation  11/19/2014  . Right shoulder pain 11/19/2014  . Preventative health care 09/05/2014  . Hyperlipidemia 09/05/2014  . Urinary frequency 09/05/2014  . Allergic rhinitis 09/05/2014  . Hypothyroidism 03/22/2014  . TIA (transient ischemic attack) 03/22/2014  . DM type 2 (diabetes mellitus, type 2) ([place]) 03/22/2014  . GERD (gastroesophageal reflux disease) 03/22/2014  . Essential hypertension, benign 03/22/2014  . Neck pain, chronic 03/22/2014  . Diverticulosis of colon without hemorrhage 03/22/2014  . Iron deficiency anemia 03/21/2014  . Anemia of chronic renal failure, stage 3 (moderate) ([place]) 03/21/2014    Past Surgical History:  Procedure Laterality Date  . ANTERIOR (CYSTOCELE) AND POSTERIOR REPAIR (RECTOCELE) WITH XENFORM GRAFT AND SACROSPINOUS FIXATION    . BLADDER SUSPENSION    . BREAST BIOPSY    . CATARACT EXTRACTION    . CHOLECYSTECTOMY  2013  . COLONOSCOPY  05/02/2007  . ERCP  2014  . TOTAL HIP ARTHROPLASTY Left 05/06/2008  . VAGINAL HYSTERECTOMY       OB History   None      Home Medications    Prior to Admission medications   Medication Sig Start Date End Date Taking? Authorizing Provider  acetaminophen (TYLENOL) 325 MG tablet Take 650 mg by mouth 2 (two) times daily.    [provider]  acetaminophen (TYLENOL) 500 MG tablet Take 1 tablet (500 mg total) by mouth every 6 (six) hours as needed. 08/06/15   Biagio Borg, MD  Acetaminophen-Codeine (TYLENOL/CODEINE #3)  300-30 MG tablet Take 1 tablet by mouth every 6 (six) hours as needed for pain. 08/09/17   Biagio Borg, MD  amLODipine (NORVASC) 10 MG tablet TAKE (1/2) TABLET BY MOUTH ONCE DAILY. Patient taking differently: Take 5 mg by mouth daily.  08/02/16   Biagio Borg, MD  aspirin 81 MG tablet TAKE ONE TABLET BY MOUTH ONCE DAILY. 08/12/16   Biagio Borg, MD  atorvastatin (LIPITOR) 20 MG tablet TAKE (1) TABLET BY MOUTH AT BEDTIME. Patient taking differently: Take 20 mg by mouth at bedtime.  08/02/16   Biagio Borg, MD  Cholecalciferol (VITAMIN D) 2000 units tablet TAKE (1) TABLET BY MOUTH ONCE DAILY. Patient taking differently: Take 2,000 Units by mouth daily.  09/13/17   Biagio Borg, MD  clindamycin (CLEOCIN) 150 MG capsule 4 by mouth 1 hour prior to dental procedures Patient taking differently: Take 600 mg by mouth See admin instructions. 4 by mouth 1 hour prior to dental procedures 09/05/14   Biagio Borg, MD  conjugated estrogens (PREMARIN) vaginal cream Place 1 Applicatorful vaginally daily. Patient not taking: Reported on 09/13/2017 03/30/16   Biagio Borg, MD  darifenacin (ENABLEX) 7.5 MG 24 hr tablet Take 1 tablet (7.5 mg total) by mouth daily. 05/01/15   Biagio Borg, MD  donepezil (ARICEPT) 10 MG tablet Take 1 tablet (10 mg total) by mouth daily. 06/10/17   Cameron Sprang, MD  fexofenadine (ALLEGRA) 180 MG tablet Take 180 mg by mouth daily.    [provider]  GAVILAX powder MIX 1 CAPFUL (17G) IN 8 OUNCES OF JUICE/WATER AND DRINK ONCE DAILY. Patient not taking: Reported on 09/13/2017 07/19/17   Biagio Borg, MD  Glucose Blood (BLOOD GLUCOSE TEST STRIPS) STRP Use as directed once weekly E11.9 09/01/17   Biagio Borg, MD  guaiFENesin (MUCINEX) 600 MG 12 hr tablet Take 1 tablet (600 mg total) by mouth 2 (two) times daily as needed. 08/24/17   Biagio Borg, MD  hydrALAZINE (APRESOLINE) 50 MG tablet Take 1 tablet (50 mg total) by mouth 3 (three) times daily. 08/26/16   Biagio Borg, MD  hydrALAZINE (APRESOLINE) 50 MG tablet TAKE 1 TABLET BY MOUTH THREE TIMES A DAY. Patient not taking: Reported on 09/13/2017 08/16/17   Biagio Borg, MD  levothyroxine (SYNTHROID, LEVOTHROID) 100 MCG tablet TAKE ONE TABLET BY MOUTH ONCE DAILY. 07/28/16   Biagio Borg, MD  magnesium oxide (MAG-OX) 400 MG tablet Take 400 mg by mouth daily.    [provider]  Melatonin 3 MG TABS TAKE (1) TABLET BY MOUTH AT BEDTIME. Patient taking differently: Take 3 mg by mouth at bedtime.  09/13/17   Biagio Borg, MD    metoprolol succinate (TOPROL-XL) 50 MG 24 hr tablet Take 1 tablet (50 mg total) by mouth daily. Take with or immediately following a meal. 09/16/17   Biagio Borg, MD  NASACORT ALLERGY 24HR 55 MCG/ACT AERO nasal inhaler SPRAY 2 SPRAYS INTO EACH NOSTRIL DAILY. Patient taking differently: Place 2 sprays into the nose daily.  08/02/16   Biagio Borg, MD  oseltamivir (TAMIFLU) 75 MG capsule Take 1 capsule (75 mg total) by mouth 2 (two) times daily. Patient not taking: Reported on 09/13/2017 03/14/17   Biagio Borg, MD  pantoprazole (PROTONIX) 40 MG tablet TAKE (1) TABLET BY MOUTH TWICE DAILY 30-60 MINUTES BEFORE BREAKFAST AND DINNER. Patient taking differently: Take 40 mg by mouth 2 (two) times daily before a meal.  08/16/16   Biagio Borg, MD  polyethylene glycol [facility] Floria Raveling) packet Take 17 g by mouth daily. 09/28/16   Biagio Borg, MD  predniSONE (DELTASONE) 10 MG tablet Take 2 tablets (20 mg total) by mouth daily with breakfast. 09/13/17   Elnora Morrison, MD  senna-docusate (SENOKOT-S) 8.6-50 MG per tablet Take 1-2 tablets, IF NEEDED, every 12 hrs for constipation 04/11/14   Volanda Napoleon, MD  traMADol (ULTRAM) 50 MG tablet TAKE (1) TABLET BY MOUTH 4 TIMES DAILY. 06/06/17   Biagio Borg, MD  triamcinolone cream (KENALOG) 0.1 % Apply 1 application topically 2 (two) times daily as needed. 02/09/17 02/09/18  Biagio Borg, MD  vitamin B-12 (CYANOCOBALAMIN) 1000 MCG tablet TAKE ONE TABLET BY MOUTH ONCE DAILY. 08/11/16   Biagio Borg, MD    Family History Family History  Problem Relation Age of Onset  . Heart attack Father   . Arthritis Mother   . Cancer Sister   . Esophageal cancer Daughter   . Seizures Daughter        As a teenager  . Colon cancer Son     Social History Social History   Tobacco Use  . Smoking status: Former Smoker    Packs/day: 1.00    Years: 25.00    Pack years: 25.00    Types: Cigarettes    Last attempt to quit: 04/11/1970    Years since quitting: 47.4  .  Smokeless tobacco: Never Used  . Tobacco comment: quit 42 years ago  Substance Use Topics  . Alcohol use: No    Alcohol/week: 0.0 standard drinks  . Drug use: No     Allergies   Allegra allergy [fexofenadine hcl]; Amoxicillin; Penicillins; and Sulfa antibiotics   Review of Systems Review of Systems Review of Systems   Constitutional  Negative for fever  Negative for chills  HENT  Negative for ear pain  Negative for sore throat  Negative for difficultly swallowing  Eyes  Negative for eye pain  Negative for visual disturbance  Respiratory  Negative for shortness of breath  Negative for cough  CV  Negative for chest pain  Negative for leg swelling  Abdomen  Negative for abdominal pain  Negative for nausea  Negative for vomiting  MSK  +for extremity pain  Negative for back pain  Skin  Negative for rash  +for wound  Neuro  Negative for syncope  Negative for difficultly speaking  Psych  Negative for confusion   The remainder of the ROS was reviewed and negative except as documented above.      Physical Exam Updated Vital Signs BP (!) 179/50 (BP Location: Left Arm)   Pulse 78   Temp 98.2 F (36.8 C) (Oral)   Resp 18   Ht 5\' 4"  (1.626 m)   Wt 51.2 kg   SpO2 95%   BMI 19.39 kg/m   Physical Exam Physical Exam Constitutional  Nursing notes reviewed  Vital signs reviewed  HEENT  Hematoma to right parietal scalp  No C-spine tenderness  EOMI  No scleral icterus or injection  Respiratory  Effort normal  CTAB  No respiratory distress  CV  Normal rate  No obvious murmurs  No pitting edema  Abdomen  Soft  Non-tender  Non-distended  No peritonitis  MSK  Atraumatic  No obvious deformity  ROM limited at right hip  Neurovascularly intact in all extremities  Skin  Warm  Dry  Neuro  Awake and alert  EOMI  Moving all extremities  Hard of hearing  Psychiatric  Mood and affect normal        ED Treatments  / Results  Labs (all labs ordered are listed, but only abnormal results are displayed) Labs Reviewed  CBC WITH DIFFERENTIAL/PLATELET - Abnormal; Notable for the following components:      Result Value   RBC 2.83 (*)    Hemoglobin 9.5 (*)    HCT 30.3 (*)    MCV 107.1 (*)    Platelets 453 (*)    All other components within normal limits  PROTIME-INR - Abnormal; Notable for the following components:   Prothrombin Time 11.1 (*)    All other components within normal limits  BASIC METABOLIC PANEL - Abnormal; Notable for the following components:   Glucose, Bld 163 (*)    BUN 32 (*)    Creatinine, Ser 1.96 (*)    Calcium 13.4 (*)    GFR calc non Af Amer 21 (*)    GFR calc Af Amer 24 (*)    All other components within normal limits  URINALYSIS, ROUTINE W REFLEX MICROSCOPIC - Abnormal; Notable for the following components:   APPearance CLOUDY (*)    Glucose, UA 50 (*)    Protein, ur 100 (*)    Bacteria, UA MANY (*)    All other components within normal limits  URINE CULTURE  CK  BASIC METABOLIC PANEL    EKG None  Radiology Dg Chest 2 View  Result Date: 09/18/2017 CLINICAL DATA:  Pain following fall EXAM: CHEST - 2 VIEW COMPARISON:  August 24, 2017 FINDINGS: There is atelectatic change in the left base. The lungs elsewhere are clear. Heart size and pulmonary vascularity are normal. There is aortic atherosclerosis. No adenopathy. Bones are osteoporotic. There is degenerative change in the thoracic spine and right shoulder. No pneumothorax. There are several anterior wedge compression fractures in the lower thoracic and upper lumbar regions. IMPRESSION: Left base atelectasis. No edema or consolidation. Stable cardiac silhouette. There is aortic atherosclerosis. No pneumothorax. Bones osteoporotic. Aortic Atherosclerosis (ICD10-I70.0). Electronically Signed   By: Lowella Grip III M.D.   On: 09/18/2017 22:01   Dg Pelvis 1-2 Views  Result Date: 09/18/2017 CLINICAL DATA:  Unwitnessed  fall EXAM: PELVIS - 1-2 VIEW COMPARISON:  None. FINDINGS: Status post bilateral hip replacements. No dislocation is evident. Protrusion of left acetabular cup screw beyond the inner cortical margin of the pelvic ring. No acute displaced fracture. Extensive vascular calcification. SI joints are non widened. Calcifications superior to the rami do not have appearance suggestive of a fracture. IMPRESSION: Status post bilateral hip replacement. No acute osseous abnormality is seen. Electronically Signed   By: Donavan Foil M.D.   On: 09/18/2017 21:59   Dg Shoulder Right  Result Date: 09/18/2017 CLINICAL DATA:  Unwitnessed fall EXAM: RIGHT SHOULDER - 2+ VIEW COMPARISON:  03/06/2015 FINDINGS: AC joint degenerative change. No fracture or dislocation. Advanced glenohumeral degenerative change with prominent inferior bony spurring. IMPRESSION: Advanced arthritis of the right shoulder. No acute osseous abnormality. Electronically Signed   By: Donavan Foil M.D.   On: 09/18/2017 22:00   Ct Head Wo Contrast  Result Date: 09/18/2017 CLINICAL DATA:  Unwitnessed falls x2 today. Hematoma and abrasion to the back of the head. EXAM: CT HEAD WITHOUT CONTRAST CT CERVICAL SPINE WITHOUT CONTRAST TECHNIQUE: Multidetector CT imaging of the head and cervical spine was performed following the standard protocol without intravenous contrast. Multiplanar CT image reconstructions of the cervical spine were also generated.  COMPARISON:  CT head and cervical spine 09/13/2017 FINDINGS: CT HEAD FINDINGS Brain: Diffuse cerebral atrophy. Ventricular dilatation consistent with central atrophy. Low-attenuation changes in the deep white matter consistent small vessel ischemia. Old lacunar infarct in the right basal ganglia. No mass effect or midline shift. No abnormal extra-axial fluid collections. Gray-white matter junctions are distinct. Basal cisterns are not effaced. No acute intracranial hemorrhage. Vascular: Prominent trochleae knee arterial  vascular calcifications are present. Skull: Calvarium appears intact. No acute depressed skull fractures identified. Sinuses/Orbits: Paranasal sinuses and mastoid air cells are clear. Other: Small subcutaneous scalp hematoma over the right posterior parietal region. CT CERVICAL SPINE FINDINGS Alignment: There is mild anterior subluxation of C4 on C5 and of T2 on T3. This is unchanged since previous study. Otherwise no anterior subluxations. Normal alignment of the facet joints. C1-2 articulation appears intact. Skull base and vertebrae: Diffuse bone demineralization. Skull base appears intact. No vertebral compression deformities. No focal bone lesion or bone destruction. Soft tissues and spinal canal: No prevertebral soft tissue swelling. No abnormal paraspinal soft tissue mass or infiltration. Disc levels: Diffuse degenerative change throughout the cervical spine with narrowed interspaces and endplate hypertrophic changes. Coalition of C2-C3 and C3-C4 could be degenerative or congenital. Coalition also of the posterior elements. Collision of C6-C7 and C7-T1 appears degenerative. Severe degenerative changes throughout the facet joints and at C1-2. Upper chest: Lung apices are clear. Vascular calcifications. Other: None. IMPRESSION: 1. No acute intracranial abnormalities. Chronic atrophy and small vessel ischemic changes. Old lacunar infarct in the right basal ganglia. 2. Diffuse degenerative change throughout the cervical spine. No change in alignment since previous study. Multiple vertebral and posterior element coalitions, congenital versus degenerative. 3. No acute displaced fractures identified. Electronically Signed   By: Lucienne Capers M.D.   On: 09/18/2017 22:24   Ct Cervical Spine Wo Contrast  Result Date: 09/18/2017 CLINICAL DATA:  Unwitnessed falls x2 today. Hematoma and abrasion to the back of the head. EXAM: CT HEAD WITHOUT CONTRAST CT CERVICAL SPINE WITHOUT CONTRAST TECHNIQUE: Multidetector CT  imaging of the head and cervical spine was performed following the standard protocol without intravenous contrast. Multiplanar CT image reconstructions of the cervical spine were also generated. COMPARISON:  CT head and cervical spine 09/13/2017 FINDINGS: CT HEAD FINDINGS Brain: Diffuse cerebral atrophy. Ventricular dilatation consistent with central atrophy. Low-attenuation changes in the deep white matter consistent small vessel ischemia. Old lacunar infarct in the right basal ganglia. No mass effect or midline shift. No abnormal extra-axial fluid collections. Gray-white matter junctions are distinct. Basal cisterns are not effaced. No acute intracranial hemorrhage. Vascular: Prominent trochleae knee arterial vascular calcifications are present. Skull: Calvarium appears intact. No acute depressed skull fractures identified. Sinuses/Orbits: Paranasal sinuses and mastoid air cells are clear. Other: Small subcutaneous scalp hematoma over the right posterior parietal region. CT CERVICAL SPINE FINDINGS Alignment: There is mild anterior subluxation of C4 on C5 and of T2 on T3. This is unchanged since previous study. Otherwise no anterior subluxations. Normal alignment of the facet joints. C1-2 articulation appears intact. Skull base and vertebrae: Diffuse bone demineralization. Skull base appears intact. No vertebral compression deformities. No focal bone lesion or bone destruction. Soft tissues and spinal canal: No prevertebral soft tissue swelling. No abnormal paraspinal soft tissue mass or infiltration. Disc levels: Diffuse degenerative change throughout the cervical spine with narrowed interspaces and endplate hypertrophic changes. Coalition of C2-C3 and C3-C4 could be degenerative or congenital. Coalition also of the posterior elements. Collision of C6-C7 and C7-T1 appears degenerative.  Severe degenerative changes throughout the facet joints and at C1-2. Upper chest: Lung apices are clear. Vascular calcifications.  Other: None. IMPRESSION: 1. No acute intracranial abnormalities. Chronic atrophy and small vessel ischemic changes. Old lacunar infarct in the right basal ganglia. 2. Diffuse degenerative change throughout the cervical spine. No change in alignment since previous study. Multiple vertebral and posterior element coalitions, congenital versus degenerative. 3. No acute displaced fractures identified. Electronically Signed   By: Lucienne Capers M.D.   On: 09/18/2017 22:24    Procedures Procedures (including critical care time)  Medications Ordered in ED Medications  acetaminophen (TYLENOL) tablet 500 mg (500 mg Oral Not Given 09/18/17 2240)  lactated ringers bolus 1,000 mL (has no administration in time range)  acetaminophen (TYLENOL) tablet 325 mg (has no administration in time range)  ondansetron (ZOFRAN-ODT) disintegrating tablet 4 mg (has no administration in time range)  lactated ringers infusion (has no administration in time range)  donepezil (ARICEPT) tablet 10 mg (has no administration in time range)  metoprolol succinate (TOPROL-XL) 24 hr tablet 50 mg (has no administration in time range)  traMADol (ULTRAM) tablet 50 mg (has no administration in time range)  Melatonin TABS 3 mg (has no administration in time range)  fentaNYL (SUBLIMAZE) injection 25 mcg (25 mcg Intravenous Given 09/18/17 2240)     Initial Impression / Assessment and Plan / ED Course  I have reviewed the triage vital signs and the nursing notes.  Pertinent labs & imaging results that were available during my care of the patient were reviewed by me and considered in my medical decision making (see chart for details).     Allison Page presents after a fall.  She has fallen 5 times at home this week.  Her fall is mechanical in nature.  There was no syncope or loss of consciousness.  She is hemodynamically stable.  We will obtain a CT head, CT C-spine, ECG, and screening labs, including a CK.  Her head wound is  hemostatic.  No repair is required.  She is not on any blood thinners.  Imaging revealed no acute injuries.  She is hypercalcemic to 13.  She has bacteria but no LE or nitrites in her urine.  Her urine has been sent for culture.  She will be given IV fluids.  I discussed her care with Dr. Myna Hidalgo.  He does not feel that she would benefit from inpatient admission.  I have consulted and messaged social work.  They will evaluate her in the ED in the morning.  The daughter is working on securing additional resources and moving her from an assisted living facility to a higher level of care.  She does not feel safe taking her mom home tonight, though.  We will observe her in the ED overnight.  I have ordered her home medications.  I have also ordered a repeat calcium for early in the morning.  Although she currently denies nausea/vomiting, a phone note showed that she was nauseas days ago.  Therefore, her hypercalcemia may be due to dehydration.  TSH and PTH pending at the time of signout.    Final Clinical Impressions(s) / ED Diagnoses   Final diagnoses:  Fall, initial encounter    ED Discharge Orders    None       Alford Highland, MD 09/18/17 2350    Elnora Morrison, MD 09/19/17 1044

## 2017-09-19 ENCOUNTER — Other Ambulatory Visit: Payer: Self-pay | Admitting: Internal Medicine

## 2017-09-19 DIAGNOSIS — S0003XA Contusion of scalp, initial encounter: Secondary | ICD-10-CM | POA: Diagnosis not present

## 2017-09-19 LAB — BASIC METABOLIC PANEL
Anion gap: 9 (ref 5–15)
BUN: 28 mg/dL — ABNORMAL HIGH (ref 8–23)
CALCIUM: 13.6 mg/dL — AB (ref 8.9–10.3)
CHLORIDE: 101 mmol/L (ref 98–111)
CO2: 28 mmol/L (ref 22–32)
CREATININE: 1.65 mg/dL — AB (ref 0.44–1.00)
GFR calc non Af Amer: 26 mL/min — ABNORMAL LOW (ref >60)
GFR, EST AFRICAN AMERICAN: 30 mL/min — AB (ref >60)
Glucose, Bld: 144 mg/dL — ABNORMAL HIGH (ref 70–99)
Potassium: 3.7 mmol/L (ref 3.5–5.1)
Sodium: 138 mmol/L (ref 135–145)

## 2017-09-19 LAB — TSH: TSH: 0.651 u[IU]/mL (ref 0.350–4.500)

## 2017-09-19 MED ORDER — METOPROLOL SUCCINATE ER 50 MG PO TB24: 50.0000 mg | ORAL_TABLET | Freq: Every day | ORAL | 3 refills | Status: AC

## 2017-09-19 MED ORDER — HYDROCODONE-ACETAMINOPHEN 5-325 MG PO TABS
1.0000 | ORAL_TABLET | Freq: Once | ORAL | Status: AC
  Administered 2017-09-19: 1 via ORAL
  Filled 2017-09-19: qty 1

## 2017-09-19 NOTE — Progress Notes (Addendum)
CSW aware that pt is from [facility]. CSW has made multiple attempts to speak with [facility] regarding further needs in pt's care, however no one has called back at this time. CSW attempted to speak with pt's daughter as it is documented that pt is sometimes alert due to dementia. CSW left daughter voicemail asking that she call CSW back.   CSW spoke with RN on unit regarding barriers to placement for pt into a SNF as needed. Barriers include pt having Medicare Part A and B, however not having the three night qualifying inpt stay as well as pt having a secondary insurance but secondary insurance not paying for SNF unless primary (Medicare) does. At this time pt is expected to have a PT evaluation. CSW will follow for further needs.    Allison Page, MSW, [facility] Clinical Social Worker [phone]

## 2017-09-19 NOTE — Progress Notes (Signed)
CSW spoke with pt's daughter [name] and was informed that she would be to get pt around 10-10:30am today. [name] expressed that she has spoken with [facility] and that she is hiring private sitters to sit with pt 24 hours a day. CSW has updated RN and will update MD. There are no further CSW needs. CSW will sign off.    Virgie Dad. Fabienne Nolasco, MSW, [facility] Clinical Social Worker [phone]

## 2017-09-19 NOTE — ED Notes (Signed)
Patient ate about 3 spoon fulls oatmeal and drink all her Orange Juice.

## 2017-09-19 NOTE — ED Notes (Signed)
Pt moved to room assisted on/off bedpan. Adult depends placed on patient.  Pt c/o arthritic pain, pt states she takes pain medication x 3 a day.  Tramadol crushed and fed to patient with applesauce.  Yellow socks and yellow bracelet placed on patient.  Pt continues to be agitated pulling off linen, pt provided verbal reassurance and reorientation

## 2017-09-20 ENCOUNTER — Ambulatory Visit: Payer: Medicare Other

## 2017-09-20 LAB — PTH, INTACT AND CALCIUM
Calcium, Total (PTH): 13.8 mg/dL (ref 8.7–10.3)
PTH: 264 pg/mL — AB (ref 15–65)

## 2017-09-21 LAB — URINE CULTURE

## 2017-09-22 ENCOUNTER — Telehealth: Payer: Self-pay | Admitting: *Deleted

## 2017-09-22 NOTE — Progress Notes (Signed)
ED Antimicrobial Stewardship Positive Culture Follow Up   Allison Page is an 84 y.o. female who presented to [facility] on 09/18/2017 with a chief complaint of  Chief Complaint  Patient presents with  . Fall    Recent Results (from the past 720 hour(s))  Urine culture     Status: Abnormal   Collection Time: 09/18/17  8:53 PM  Result Value Ref Range Status   Specimen Description URINE, RANDOM  Final   Special Requests   Final    NONE Performed at [facility], [address] [place], [place] [address]    Culture >=100,000 COLONIES/mL CITROBACTER FREUNDII (A)  Final   Report Status 09/21/2017 FINAL  Final   Organism ID, Bacteria CITROBACTER FREUNDII (A)  Final      Susceptibility   Citrobacter freundii - MIC*    CEFAZOLIN >=64 RESISTANT Resistant     CEFTRIAXONE <=1 SENSITIVE Sensitive     CIPROFLOXACIN <=0.25 SENSITIVE Sensitive     GENTAMICIN <=1 SENSITIVE Sensitive     IMIPENEM <=0.25 SENSITIVE Sensitive     NITROFURANTOIN <=16 SENSITIVE Sensitive     TRIMETH/SULFA <=20 SENSITIVE Sensitive     PIP/TAZO <=4 SENSITIVE Sensitive     * >=100,000 COLONIES/mL CITROBACTER FREUNDII    [x]  Patient discharged originally without antimicrobial agent and treatment now may be indicated  New antibiotic prescription: Have Flow Manager call patient's daughter. If patient back to normal and experiencing no urinary symptoms, no further treatment necessary. If patient not back to baseline or experiencing urinary symptoms, treat with Cipro 250mg  PO daily x 3 days.   ED Provider: Nuala Alpha, PA-C   [name] 09/22/2017, 8:17 AM Clinical Pharmacist Monday - Friday phone -  [phone] Saturday - Sunday phone - [phone]

## 2017-09-22 NOTE — Telephone Encounter (Signed)
Post ED Visit - Positive Culture Follow-up  Culture report reviewed by antimicrobial stewardship pharmacist:  []  Elenor Quinones, Pharm.D. []  Heide Guile, Pharm.D., BCPS AQ-ID []  Parks Neptune, Pharm.D., BCPS []  Alycia Rossetti, Pharm.D., BCPS []  [place], [place].D., BCPS, AAHIVP []  Legrand Como, Pharm.D., BCPS, AAHIVP []  Salome Arnt, PharmD, BCPS []  Johnnette Gourd, PharmD, BCPS []  Hughes Better, PharmD, BCPS []  Leeroy Cha, PharmD Elicia Lamp PharmD  Positive urine culture Spoke with [facility] where patient is a resident.  Asymptomatic at present and no further patient follow-up is required at this time.  Harlon Flor [facility] 09/22/2017, 10:09 AM

## 2017-09-24 ENCOUNTER — Telehealth: Payer: Self-pay | Admitting: Surgery

## 2017-09-24 NOTE — Telephone Encounter (Signed)
ED CM noted message in my in-basket messages in Epic dated 8/18 concerning patient and family needing assistance with transitional care from the ED. CM noted that ED CSW assisted patient and family on 8/19 with needs.

## 2017-09-28 ENCOUNTER — Other Ambulatory Visit: Payer: Self-pay | Admitting: Family

## 2017-09-28 DIAGNOSIS — D631 Anemia in chronic kidney disease: Secondary | ICD-10-CM

## 2017-09-28 DIAGNOSIS — N183 Chronic kidney disease, stage 3 (moderate): Principal | ICD-10-CM

## 2017-09-30 ENCOUNTER — Other Ambulatory Visit: Payer: Self-pay

## 2017-09-30 ENCOUNTER — Inpatient Hospital Stay: Payer: Medicare Other

## 2017-09-30 VITALS — BP 160/58 | HR 58 | Temp 98.3°F | Resp 18

## 2017-09-30 DIAGNOSIS — D631 Anemia in chronic kidney disease: Secondary | ICD-10-CM

## 2017-09-30 DIAGNOSIS — D500 Iron deficiency anemia secondary to blood loss (chronic): Secondary | ICD-10-CM

## 2017-09-30 DIAGNOSIS — N183 Chronic kidney disease, stage 3 unspecified: Secondary | ICD-10-CM

## 2017-09-30 DIAGNOSIS — N189 Chronic kidney disease, unspecified: Secondary | ICD-10-CM | POA: Diagnosis not present

## 2017-09-30 LAB — CBC WITH DIFFERENTIAL (CANCER CENTER ONLY)
BAND NEUTROPHILS: 0 %
BASOS PCT: 0 %
Basophils Absolute: 0 10*3/uL (ref 0.0–0.1)
Blasts: 0 %
EOS ABS: 0.1 10*3/uL (ref 0.0–0.5)
EOS PCT: 2 %
HCT: 28.3 % — ABNORMAL LOW (ref 34.8–46.6)
HEMOGLOBIN: 8.8 g/dL — AB (ref 11.6–15.9)
LYMPHS ABS: 0.6 10*3/uL — AB (ref 0.9–3.3)
LYMPHS PCT: 19 %
MCH: 33.5 pg (ref 26.0–34.0)
MCHC: 31.1 g/dL — AB (ref 32.0–36.0)
MCV: 107.6 fL — ABNORMAL HIGH (ref 81.0–101.0)
MONO ABS: 0.2 10*3/uL (ref 0.1–0.9)
Metamyelocytes Relative: 1 %
Monocytes Relative: 7 %
Myelocytes: 0 %
NEUTROS ABS: 2.5 10*3/uL (ref 1.5–6.5)
Neutrophils Relative %: 71 %
OTHER: 0 %
PLATELETS: 539 10*3/uL — AB (ref 145–400)
PROMYELOCYTES RELATIVE: 0 %
RBC: 2.63 MIL/uL — ABNORMAL LOW (ref 3.70–5.32)
RDW: 14.7 % (ref 11.1–15.7)
WBC Count: 3.4 10*3/uL — ABNORMAL LOW (ref 3.9–10.0)
nRBC: 0 /100 WBC

## 2017-09-30 LAB — CMP (CANCER CENTER ONLY)
ALBUMIN: 3.5 g/dL (ref 3.5–5.0)
ALT: 54 U/L — ABNORMAL HIGH (ref 10–47)
AST: 45 U/L — ABNORMAL HIGH (ref 11–38)
Alkaline Phosphatase: 126 U/L — ABNORMAL HIGH (ref 26–84)
Anion gap: 5 (ref 5–15)
BUN: 41 mg/dL — AB (ref 7–22)
CHLORIDE: 103 mmol/L (ref 98–108)
CO2: 29 mmol/L (ref 18–33)
CREATININE: 2.1 mg/dL — AB (ref 0.60–1.20)
Calcium: 13 mg/dL — ABNORMAL HIGH (ref 8.0–10.3)
Glucose, Bld: 140 mg/dL — ABNORMAL HIGH (ref 73–118)
POTASSIUM: 4.3 mmol/L (ref 3.3–4.7)
SODIUM: 137 mmol/L (ref 128–145)
Total Bilirubin: 0.4 mg/dL (ref 0.2–1.6)
Total Protein: 6.9 g/dL (ref 6.4–8.1)

## 2017-09-30 MED ORDER — DARBEPOETIN ALFA 300 MCG/0.6ML IJ SOSY
PREFILLED_SYRINGE | INTRAMUSCULAR | Status: AC
  Filled 2017-09-30: qty 0.6

## 2017-09-30 MED ORDER — DARBEPOETIN ALFA 300 MCG/0.6ML IJ SOSY
300.0000 ug | PREFILLED_SYRINGE | Freq: Once | INTRAMUSCULAR | Status: AC
  Administered 2017-09-30: 300 ug via SUBCUTANEOUS

## 2017-09-30 NOTE — Patient Instructions (Signed)

## 2017-10-28 ENCOUNTER — Encounter: Payer: Self-pay | Admitting: Family

## 2017-11-09 DIAGNOSIS — E119 Type 2 diabetes mellitus without complications: Secondary | ICD-10-CM

## 2017-11-09 DIAGNOSIS — E039 Hypothyroidism, unspecified: Secondary | ICD-10-CM

## 2017-11-09 DIAGNOSIS — R2689 Other abnormalities of gait and mobility: Secondary | ICD-10-CM | POA: Diagnosis not present

## 2017-11-09 DIAGNOSIS — I10 Essential (primary) hypertension: Secondary | ICD-10-CM

## 2017-11-09 DIAGNOSIS — M1990 Unspecified osteoarthritis, unspecified site: Secondary | ICD-10-CM

## 2017-11-09 DIAGNOSIS — K219 Gastro-esophageal reflux disease without esophagitis: Secondary | ICD-10-CM

## 2017-11-09 DIAGNOSIS — D649 Anemia, unspecified: Secondary | ICD-10-CM | POA: Diagnosis not present

## 2017-11-15 ENCOUNTER — Inpatient Hospital Stay: Payer: Medicare Other

## 2017-11-15 ENCOUNTER — Other Ambulatory Visit: Payer: Self-pay

## 2017-11-15 ENCOUNTER — Inpatient Hospital Stay (HOSPITAL_BASED_OUTPATIENT_CLINIC_OR_DEPARTMENT_OTHER): Payer: Medicare Other | Admitting: Family

## 2017-11-15 VITALS — BP 128/39 | HR 66 | Temp 97.6°F | Resp 15

## 2017-11-15 DIAGNOSIS — N183 Chronic kidney disease, stage 3 unspecified: Secondary | ICD-10-CM

## 2017-11-15 DIAGNOSIS — R05 Cough: Secondary | ICD-10-CM | POA: Insufficient documentation

## 2017-11-15 DIAGNOSIS — R5383 Other fatigue: Secondary | ICD-10-CM

## 2017-11-15 DIAGNOSIS — D631 Anemia in chronic kidney disease: Secondary | ICD-10-CM

## 2017-11-15 DIAGNOSIS — Z7982 Long term (current) use of aspirin: Secondary | ICD-10-CM

## 2017-11-15 DIAGNOSIS — R531 Weakness: Secondary | ICD-10-CM | POA: Insufficient documentation

## 2017-11-15 DIAGNOSIS — Z79899 Other long term (current) drug therapy: Secondary | ICD-10-CM | POA: Insufficient documentation

## 2017-11-15 DIAGNOSIS — J181 Lobar pneumonia, unspecified organism: Secondary | ICD-10-CM | POA: Diagnosis not present

## 2017-11-15 DIAGNOSIS — D500 Iron deficiency anemia secondary to blood loss (chronic): Secondary | ICD-10-CM | POA: Insufficient documentation

## 2017-11-15 DIAGNOSIS — C183 Malignant neoplasm of hepatic flexure: Secondary | ICD-10-CM

## 2017-11-15 LAB — CBC WITH DIFFERENTIAL (CANCER CENTER ONLY)
ABS IMMATURE GRANULOCYTES: 0.02 10*3/uL (ref 0.00–0.07)
BASOS PCT: 0 %
Basophils Absolute: 0 10*3/uL (ref 0.0–0.1)
Eosinophils Absolute: 0.3 10*3/uL (ref 0.0–0.5)
Eosinophils Relative: 6 %
HEMATOCRIT: 26.1 % — AB (ref 36.0–46.0)
Hemoglobin: 7.9 g/dL — ABNORMAL LOW (ref 12.0–15.0)
Immature Granulocytes: 0 %
LYMPHS ABS: 1.4 10*3/uL (ref 0.7–4.0)
Lymphocytes Relative: 27 %
MCH: 33.5 pg (ref 26.0–34.0)
MCHC: 30.3 g/dL (ref 30.0–36.0)
MCV: 110.6 fL — AB (ref 80.0–100.0)
MONO ABS: 0.2 10*3/uL (ref 0.1–1.0)
MONOS PCT: 3 %
NEUTROS ABS: 3.3 10*3/uL (ref 1.7–7.7)
Neutrophils Relative %: 64 %
PLATELETS: 375 10*3/uL (ref 150–400)
RBC: 2.36 MIL/uL — ABNORMAL LOW (ref 3.87–5.11)
RDW: 14.3 % (ref 11.5–15.5)
WBC Count: 5.2 10*3/uL (ref 4.0–10.5)
nRBC: 0 % (ref 0.0–0.2)

## 2017-11-15 LAB — CMP (CANCER CENTER ONLY)
ALK PHOS: 104 U/L — AB (ref 26–84)
ALT: 16 U/L (ref 10–47)
AST: 21 U/L (ref 11–38)
Albumin: 3.3 g/dL — ABNORMAL LOW (ref 3.5–5.0)
Anion gap: 0 — ABNORMAL LOW (ref 5–15)
BILIRUBIN TOTAL: 0.4 mg/dL (ref 0.2–1.6)
BUN: 39 mg/dL — ABNORMAL HIGH (ref 7–22)
CALCIUM: 12.8 mg/dL — AB (ref 8.0–10.3)
CO2: 26 mmol/L (ref 18–33)
Chloride: 110 mmol/L — ABNORMAL HIGH (ref 98–108)
Creatinine: 2.2 mg/dL — ABNORMAL HIGH (ref 0.60–1.20)
Glucose, Bld: 161 mg/dL — ABNORMAL HIGH (ref 73–118)
POTASSIUM: 3.9 mmol/L (ref 3.3–4.7)
Sodium: 135 mmol/L (ref 128–145)
TOTAL PROTEIN: 7.1 g/dL (ref 6.4–8.1)

## 2017-11-15 MED ORDER — DARBEPOETIN ALFA 300 MCG/0.6ML IJ SOSY
PREFILLED_SYRINGE | INTRAMUSCULAR | Status: AC
  Filled 2017-11-15: qty 0.6

## 2017-11-15 MED ORDER — DARBEPOETIN ALFA 300 MCG/0.6ML IJ SOSY: 300.0000 ug | PREFILLED_SYRINGE | Freq: Once | INTRAMUSCULAR | Status: DC

## 2017-11-15 MED ORDER — DARBEPOETIN ALFA 500 MCG/ML IJ SOSY
500.0000 ug | PREFILLED_SYRINGE | Freq: Once | INTRAMUSCULAR | Status: AC
  Administered 2017-11-15: 500 ug via SUBCUTANEOUS

## 2017-11-15 NOTE — Patient Instructions (Signed)

## 2017-11-15 NOTE — Progress Notes (Signed)
Hematology and Oncology Follow Up Visit  NISHAT LIVINGSTON [id number] [birth year] 84 y.o. 11/15/2017   Principle Diagnosis:  Anemia of renal insufficiency Iron deficiency anemia  Current Therapy:   IV iron as indicated Aranesp 300 g subcutaneous for hemoglobin less than 10   Interim History:  Ms. Gover is here today with her daughter for follow-up. She appears to have lost quite a bit of weight but was too fatigued and weak to stand for a weight today. Her daughter states that she was down to 102 lbs last week at another appointment.  Hgb today is 7.9, MCV 110. BP 128/39, HR 66. She had 2 falls in August and states that she is still aching and having pain "all over". She is taking Norco as needed for the pain.  She denies noting any blood loss. Her skin is thin and bruises easily.  She has some mild SOB with over exertion and has a dry cough. She has audible wheezes throughout her lungs. Her daughter states that her PCP has her on Robitussin and will add Mucinex if needed.  No fever, chills, n/v, rash, dizziness, chest pain, palpitations, abdominal pain or changes in bowel or bladder habits.  She has no appetite but is trying to stay well hydrated. She is supplementing with boost and her daughter has been fixing her soups.  She is sleeping and staying in bed most of the time.  No lymphadenopathy noted on exam.   ECOG Performance Status: 1 - Symptomatic but completely ambulatory  Medications:  Allergies as of 11/15/2017      Reactions   Allegra Allergy [fexofenadine Hcl] Anaphylaxis   Amoxicillin    Penicillins    Sulfa Antibiotics       Medication List        Accurate as of 11/15/17  2:15 PM. Always use your most recent med list.          acetaminophen 325 MG tablet Commonly known as:  TYLENOL Take 650 mg by mouth 2 (two) times daily.   acetaminophen 500 MG tablet Commonly known as:  TYLENOL Take 1 tablet (500 mg total) by mouth every 6 (six) hours as needed.   Acetaminophen-Codeine 300-30 MG tablet Take 1 tablet by mouth every 6 (six) hours as needed for pain.   amLODipine 10 MG tablet Commonly known as:  NORVASC TAKE (1/2) TABLET BY MOUTH ONCE DAILY.   aspirin 81 MG tablet TAKE ONE TABLET BY MOUTH ONCE DAILY.   atorvastatin 20 MG tablet Commonly known as:  LIPITOR TAKE (1) TABLET BY MOUTH AT BEDTIME.   BLOOD GLUCOSE TEST STRIPS Strp Use as directed once weekly E11.9   conjugated estrogens vaginal cream Commonly known as:  PREMARIN Place 1 Applicatorful vaginally daily.   darifenacin 7.5 MG 24 hr tablet Commonly known as:  ENABLEX Take 1 tablet (7.5 mg total) by mouth daily.   donepezil 10 MG tablet Commonly known as:  ARICEPT Take 1 tablet (10 mg total) by mouth daily.   fexofenadine 180 MG tablet Commonly known as:  ALLEGRA Take 180 mg by mouth daily.   guaiFENesin 600 MG 12 hr tablet Commonly known as:  MUCINEX Take 1 tablet (600 mg total) by mouth 2 (two) times daily as needed.   hydrALAZINE 50 MG tablet Commonly known as:  APRESOLINE Take 1 tablet (50 mg total) by mouth 3 (three) times daily.   hydrALAZINE 50 MG tablet Commonly known as:  APRESOLINE TAKE 1 TABLET BY MOUTH THREE TIMES A DAY.  levothyroxine 100 MCG tablet Commonly known as:  SYNTHROID, LEVOTHROID TAKE ONE TABLET BY MOUTH ONCE DAILY.   magnesium oxide 400 MG tablet Commonly known as:  MAG-OX Take 400 mg by mouth daily.   Melatonin 3 MG Subl Place 3 mg under the tongue at bedtime.   metoprolol succinate 50 MG 24 hr tablet Commonly known as:  TOPROL-XL Take 1 tablet (50 mg total) by mouth daily. Take with or immediately following a meal.   NASACORT ALLERGY 24HR 55 MCG/ACT Aero nasal inhaler Generic drug:  triamcinolone SPRAY 2 SPRAYS INTO EACH NOSTRIL DAILY.   oseltamivir 75 MG capsule Commonly known as:  TAMIFLU Take 1 capsule (75 mg total) by mouth 2 (two) times daily.   pantoprazole 40 MG tablet Commonly known as:  PROTONIX TAKE  (1) TABLET BY MOUTH TWICE DAILY 30-60 MINUTES BEFORE BREAKFAST AND DINNER.   polyethylene glycol packet Commonly known as:  MIRALAX / GLYCOLAX Take 17 g by mouth daily.   GAVILAX powder Generic drug:  polyethylene glycol powder MIX 1 CAPFUL (17G) IN 8 OUNCES OF JUICE/WATER AND DRINK ONCE DAILY.   predniSONE 10 MG tablet Commonly known as:  DELTASONE Take 2 tablets (20 mg total) by mouth daily with breakfast.   senna-docusate 8.6-50 MG tablet Commonly known as:  Senokot-S Take 1-2 tablets, IF NEEDED, every 12 hrs for constipation   traMADol 50 MG tablet Commonly known as:  ULTRAM TAKE (1) TABLET BY MOUTH 4 TIMES DAILY.   triamcinolone cream 0.1 % Commonly known as:  KENALOG Apply 1 application topically 2 (two) times daily as needed.   vitamin B-12 1000 MCG tablet Commonly known as:  CYANOCOBALAMIN TAKE ONE TABLET BY MOUTH ONCE DAILY.   Vitamin D 2000 units tablet TAKE (1) TABLET BY MOUTH ONCE DAILY.       Allergies:  Allergies  Allergen Reactions  . Allegra Allergy [Fexofenadine Hcl] Anaphylaxis  . Amoxicillin   . Penicillins   . Sulfa Antibiotics     Past Medical History, Surgical history, Social history, and Family History were reviewed and updated.  Review of Systems: All other 10 point review of systems is negative.   Physical Exam:  oral temperature is 97.6 F (36.4 C). Her blood pressure is 128/39 (abnormal) and her pulse is 66. Her respiration is 15 and oxygen saturation is 94%.   Wt Readings from Last 3 Encounters:  09/18/17 112 lb 15.8 oz (51.2 kg)  09/06/17 113 lb (51.3 kg)  07/21/17 117 lb (53.1 kg)    Ocular: Sclerae unicteric, pupils equal, round and reactive to light Ear-nose-throat: Oropharynx clear, dentition fair Lymphatic: No cervical, supraclavicular or axillary adenopathy Lungs no rales or rhonchi, good excursion bilaterally Heart regular rate and rhythm, no murmur appreciated Abd soft, nontender, positive bowel sounds, no liver or  spleen tip palpated one exam, no fluid wave  MSK no focal spinal tenderness, no joint edema Neuro: non-focal, well-oriented, appropriate affect Breasts: Deferred   Lab Results  Component Value Date   WBC 5.2 11/15/2017   HGB 7.9 (L) 11/15/2017   HCT 26.1 (L) 11/15/2017   MCV 110.6 (H) 11/15/2017   PLT 375 11/15/2017   Lab Results  Component Value Date   FERRITIN 127 09/06/2017   IRON 102 09/06/2017   TIBC 238 09/06/2017   UIBC 136 09/06/2017   IRONPCTSAT 43 09/06/2017   Lab Results  Component Value Date   RETICCTPCT 1.5 04/25/2017   RBC 2.36 (L) 11/15/2017   RETICCTABS 60.5 12/13/2014   No results found  for: KPAFRELGTCHN, LAMBDASER, KAPLAMBRATIO No results found for: IGGSERUM, IGA, IGMSERUM No results found for: Odetta Pink, SPEI   Chemistry      Component Value Date/Time   NA 135 11/15/2017 1333   NA 141 11/29/2016 1314   K 3.9 11/15/2017 1333   K 3.9 11/29/2016 1314   CL 110 (H) 11/15/2017 1333   CL 106 11/29/2016 1314   CO2 26 11/15/2017 1333   CO2 27 11/29/2016 1314   BUN 39 (H) 11/15/2017 1333   BUN 28 (H) 11/29/2016 1314   CREATININE 2.20 (H) 11/15/2017 1333   CREATININE 2.1 (H) 11/29/2016 1314      Component Value Date/Time   CALCIUM 12.8 (H) 11/15/2017 1333   CALCIUM 13.8 (HH) 09/19/2017 0254   ALKPHOS 104 (H) 11/15/2017 1333   ALKPHOS 63 11/29/2016 1314   AST 21 11/15/2017 1333   ALT 16 11/15/2017 1333   ALT 19 11/29/2016 1314   BILITOT 0.4 11/15/2017 1333       Impression and Plan: Ms. Heaphy is a very pleasant 84 yo caucasian female with both iron deficiency ane anemia of chronic renal insufficiency. She is symptomatic with anemia.  Hgb is 7.9 with an MCV of 110. We discussed giving her blood and she states that she can not sit in a chair for more than an hour due to her pain and does not want to be transfused.  She appears to have really gone down hill since her falls in August.  She  would prefer to have an injection instead. I spoke with Dr. Marin Olp about this and we will increase her Aranesp to 500 mcg. They are in agreement with this and will contact our office with any questions or concerns. We will plan to see her back in another 3 weeks for follow-up. We can certainly see her sooner if need be.   Laverna Peace, NP 10/15/20192:15 PM

## 2017-11-16 ENCOUNTER — Emergency Department (HOSPITAL_COMMUNITY): Payer: Medicare Other

## 2017-11-16 ENCOUNTER — Other Ambulatory Visit: Payer: Self-pay

## 2017-11-16 ENCOUNTER — Encounter (HOSPITAL_COMMUNITY): Payer: Self-pay

## 2017-11-16 ENCOUNTER — Inpatient Hospital Stay (HOSPITAL_COMMUNITY)
Admission: EM | Admit: 2017-11-16 | Discharge: 2017-11-24 | DRG: 193 | Disposition: A | Discharge status: Skilled Nursing Facility | Payer: Medicare Other | Attending: Internal Medicine | Admitting: Internal Medicine

## 2017-11-16 DIAGNOSIS — D631 Anemia in chronic kidney disease: Secondary | ICD-10-CM | POA: Diagnosis present

## 2017-11-16 DIAGNOSIS — I10 Essential (primary) hypertension: Secondary | ICD-10-CM | POA: Diagnosis not present

## 2017-11-16 DIAGNOSIS — Z515 Encounter for palliative care: Secondary | ICD-10-CM | POA: Diagnosis present

## 2017-11-16 DIAGNOSIS — J181 Lobar pneumonia, unspecified organism: Principal | ICD-10-CM | POA: Diagnosis present

## 2017-11-16 DIAGNOSIS — J209 Acute bronchitis, unspecified: Secondary | ICD-10-CM | POA: Diagnosis present

## 2017-11-16 DIAGNOSIS — Z888 Allergy status to other drugs, medicaments and biological substances status: Secondary | ICD-10-CM

## 2017-11-16 DIAGNOSIS — E039 Hypothyroidism, unspecified: Secondary | ICD-10-CM | POA: Diagnosis present

## 2017-11-16 DIAGNOSIS — Z882 Allergy status to sulfonamides status: Secondary | ICD-10-CM

## 2017-11-16 DIAGNOSIS — I129 Hypertensive chronic kidney disease with stage 1 through stage 4 chronic kidney disease, or unspecified chronic kidney disease: Secondary | ICD-10-CM | POA: Diagnosis present

## 2017-11-16 DIAGNOSIS — Z66 Do not resuscitate: Secondary | ICD-10-CM | POA: Diagnosis present

## 2017-11-16 DIAGNOSIS — F0390 Unspecified dementia without behavioral disturbance: Secondary | ICD-10-CM | POA: Diagnosis present

## 2017-11-16 DIAGNOSIS — Z90710 Acquired absence of both cervix and uterus: Secondary | ICD-10-CM

## 2017-11-16 DIAGNOSIS — Z8249 Family history of ischemic heart disease and other diseases of the circulatory system: Secondary | ICD-10-CM

## 2017-11-16 DIAGNOSIS — Z96642 Presence of left artificial hip joint: Secondary | ICD-10-CM | POA: Diagnosis present

## 2017-11-16 DIAGNOSIS — J9811 Atelectasis: Secondary | ICD-10-CM | POA: Diagnosis present

## 2017-11-16 DIAGNOSIS — R0902 Hypoxemia: Secondary | ICD-10-CM | POA: Diagnosis present

## 2017-11-16 DIAGNOSIS — J189 Pneumonia, unspecified organism: Secondary | ICD-10-CM

## 2017-11-16 DIAGNOSIS — E785 Hyperlipidemia, unspecified: Secondary | ICD-10-CM | POA: Diagnosis present

## 2017-11-16 DIAGNOSIS — M1990 Unspecified osteoarthritis, unspecified site: Secondary | ICD-10-CM | POA: Diagnosis present

## 2017-11-16 DIAGNOSIS — E1122 Type 2 diabetes mellitus with diabetic chronic kidney disease: Secondary | ICD-10-CM | POA: Diagnosis present

## 2017-11-16 DIAGNOSIS — N390 Urinary tract infection, site not specified: Secondary | ICD-10-CM | POA: Diagnosis present

## 2017-11-16 DIAGNOSIS — Z794 Long term (current) use of insulin: Secondary | ICD-10-CM | POA: Diagnosis not present

## 2017-11-16 DIAGNOSIS — Z9049 Acquired absence of other specified parts of digestive tract: Secondary | ICD-10-CM | POA: Diagnosis not present

## 2017-11-16 DIAGNOSIS — Z79891 Long term (current) use of opiate analgesic: Secondary | ICD-10-CM

## 2017-11-16 DIAGNOSIS — Z8673 Personal history of transient ischemic attack (TIA), and cerebral infarction without residual deficits: Secondary | ICD-10-CM

## 2017-11-16 DIAGNOSIS — Z79890 Hormone replacement therapy (postmenopausal): Secondary | ICD-10-CM

## 2017-11-16 DIAGNOSIS — R0600 Dyspnea, unspecified: Secondary | ICD-10-CM

## 2017-11-16 DIAGNOSIS — G8929 Other chronic pain: Secondary | ICD-10-CM | POA: Diagnosis present

## 2017-11-16 DIAGNOSIS — D509 Iron deficiency anemia, unspecified: Secondary | ICD-10-CM | POA: Diagnosis present

## 2017-11-16 DIAGNOSIS — G9341 Metabolic encephalopathy: Secondary | ICD-10-CM | POA: Diagnosis present

## 2017-11-16 DIAGNOSIS — Z7951 Long term (current) use of inhaled steroids: Secondary | ICD-10-CM

## 2017-11-16 DIAGNOSIS — Z79899 Other long term (current) drug therapy: Secondary | ICD-10-CM

## 2017-11-16 DIAGNOSIS — K219 Gastro-esophageal reflux disease without esophagitis: Secondary | ICD-10-CM | POA: Diagnosis present

## 2017-11-16 DIAGNOSIS — E119 Type 2 diabetes mellitus without complications: Secondary | ICD-10-CM

## 2017-11-16 DIAGNOSIS — N183 Chronic kidney disease, stage 3 unspecified: Secondary | ICD-10-CM | POA: Diagnosis present

## 2017-11-16 DIAGNOSIS — J9601 Acute respiratory failure with hypoxia: Secondary | ICD-10-CM | POA: Diagnosis present

## 2017-11-16 DIAGNOSIS — Z87891 Personal history of nicotine dependence: Secondary | ICD-10-CM

## 2017-11-16 DIAGNOSIS — N179 Acute kidney failure, unspecified: Secondary | ICD-10-CM | POA: Diagnosis present

## 2017-11-16 DIAGNOSIS — Z880 Allergy status to penicillin: Secondary | ICD-10-CM

## 2017-11-16 DIAGNOSIS — E210 Primary hyperparathyroidism: Secondary | ICD-10-CM | POA: Diagnosis present

## 2017-11-16 DIAGNOSIS — Z7982 Long term (current) use of aspirin: Secondary | ICD-10-CM

## 2017-11-16 DIAGNOSIS — E1110 Type 2 diabetes mellitus with ketoacidosis without coma: Secondary | ICD-10-CM | POA: Diagnosis not present

## 2017-11-16 LAB — BASIC METABOLIC PANEL
Anion gap: 7 (ref 5–15)
BUN: 43 mg/dL — AB (ref 8–23)
CO2: 23 mmol/L (ref 22–32)
Calcium: 12.8 mg/dL — ABNORMAL HIGH (ref 8.9–10.3)
Chloride: 104 mmol/L (ref 98–111)
Creatinine, Ser: 2.28 mg/dL — ABNORMAL HIGH (ref 0.44–1.00)
GFR, EST AFRICAN AMERICAN: 20 mL/min — AB (ref >60)
GFR, EST NON AFRICAN AMERICAN: 17 mL/min — AB (ref >60)
GLUCOSE: 130 mg/dL — AB (ref 70–99)
Potassium: 3.5 mmol/L (ref 3.5–5.1)
Sodium: 134 mmol/L — ABNORMAL LOW (ref 135–145)

## 2017-11-16 LAB — CBG MONITORING, ED: GLUCOSE-CAPILLARY: 132 mg/dL — AB (ref 70–99)

## 2017-11-16 LAB — CBC
HCT: 23.9 % — ABNORMAL LOW (ref 36.0–46.0)
Hemoglobin: 7.4 g/dL — ABNORMAL LOW (ref 12.0–15.0)
MCH: 34.3 pg — AB (ref 26.0–34.0)
MCHC: 31 g/dL (ref 30.0–36.0)
MCV: 110.6 fL — ABNORMAL HIGH (ref 80.0–100.0)
PLATELETS: 352 10*3/uL (ref 150–400)
RBC: 2.16 MIL/uL — ABNORMAL LOW (ref 3.87–5.11)
RDW: 14.3 % (ref 11.5–15.5)
WBC: 4.8 10*3/uL (ref 4.0–10.5)
nRBC: 0 % (ref 0.0–0.2)

## 2017-11-16 LAB — I-STAT TROPONIN, ED: Troponin i, poc: 0 ng/mL (ref 0.00–0.08)

## 2017-11-16 LAB — I-STAT CG4 LACTIC ACID, ED: Lactic Acid, Venous: 0.99 mmol/L (ref 0.5–1.9)

## 2017-11-16 LAB — INFLUENZA PANEL BY PCR (TYPE A & B)
Influenza A By PCR: NEGATIVE
Influenza B By PCR: NEGATIVE

## 2017-11-16 MED ORDER — ONDANSETRON HCL 4 MG PO TABS: 4.0000 mg | ORAL_TABLET | Freq: Four times a day (QID) | ORAL | Status: DC | PRN

## 2017-11-16 MED ORDER — ACETAMINOPHEN 650 MG RE SUPP: 650.0000 mg | Freq: Four times a day (QID) | RECTAL | Status: DC | PRN

## 2017-11-16 MED ORDER — PANTOPRAZOLE SODIUM 40 MG PO TBEC
40.0000 mg | DELAYED_RELEASE_TABLET | Freq: Two times a day (BID) | ORAL | Status: DC
  Administered 2017-11-17 – 2017-11-23 (×11): 40 mg via ORAL
  Filled 2017-11-16 (×11): qty 1

## 2017-11-16 MED ORDER — LEVOTHYROXINE SODIUM 100 MCG PO TABS
100.0000 ug | ORAL_TABLET | Freq: Every day | ORAL | Status: DC
  Administered 2017-11-18 – 2017-11-24 (×7): 100 ug via ORAL
  Filled 2017-11-16 (×9): qty 1

## 2017-11-16 MED ORDER — LORAZEPAM 0.5 MG PO TABS
0.2500 mg | ORAL_TABLET | Freq: Every day | ORAL | Status: DC
  Administered 2017-11-17 – 2017-11-18 (×2): 0.25 mg via ORAL
  Filled 2017-11-16 (×2): qty 1

## 2017-11-16 MED ORDER — VITAMIN B-12 1000 MCG PO TABS
1000.0000 ug | ORAL_TABLET | Freq: Every day | ORAL | Status: DC
  Administered 2017-11-17 – 2017-11-23 (×7): 1000 ug via ORAL
  Filled 2017-11-16 (×7): qty 1

## 2017-11-16 MED ORDER — SODIUM CHLORIDE 0.9 % IV SOLN
100.0000 mg | Freq: Once | INTRAVENOUS | Status: AC
  Administered 2017-11-16: 100 mg via INTRAVENOUS
  Filled 2017-11-16: qty 100

## 2017-11-16 MED ORDER — ONDANSETRON HCL 4 MG/2ML IJ SOLN: 4.0000 mg | Freq: Four times a day (QID) | INTRAMUSCULAR | Status: DC | PRN

## 2017-11-16 MED ORDER — POLYETHYLENE GLYCOL 3350 17 G PO PACK
17.0000 g | PACK | Freq: Every day | ORAL | Status: DC
  Administered 2017-11-17 – 2017-11-24 (×6): 17 g via ORAL
  Filled 2017-11-16 (×6): qty 1

## 2017-11-16 MED ORDER — ACETAMINOPHEN 325 MG PO TABS
650.0000 mg | ORAL_TABLET | Freq: Four times a day (QID) | ORAL | Status: DC | PRN
  Administered 2017-11-20: 650 mg via ORAL
  Filled 2017-11-16: qty 2

## 2017-11-16 MED ORDER — SODIUM CHLORIDE 0.9 % IV BOLUS
1000.0000 mL | Freq: Once | INTRAVENOUS | Status: AC
  Administered 2017-11-16: 1000 mL via INTRAVENOUS

## 2017-11-16 MED ORDER — ASPIRIN EC 81 MG PO TBEC
81.0000 mg | DELAYED_RELEASE_TABLET | Freq: Every day | ORAL | Status: DC
  Administered 2017-11-17 – 2017-11-23 (×6): 81 mg via ORAL
  Filled 2017-11-16 (×7): qty 1

## 2017-11-16 MED ORDER — DONEPEZIL HCL 10 MG PO TABS
10.0000 mg | ORAL_TABLET | Freq: Every day | ORAL | Status: DC
  Administered 2017-11-17 – 2017-11-24 (×8): 10 mg via ORAL
  Filled 2017-11-16 (×8): qty 1

## 2017-11-16 MED ORDER — VITAMIN D 1000 UNITS PO TABS
2000.0000 [IU] | ORAL_TABLET | Freq: Every day | ORAL | Status: DC
  Administered 2017-11-17 – 2017-11-23 (×7): 2000 [IU] via ORAL
  Filled 2017-11-16 (×7): qty 2

## 2017-11-16 MED ORDER — ATORVASTATIN CALCIUM 20 MG PO TABS
20.0000 mg | ORAL_TABLET | Freq: Every day | ORAL | Status: DC
  Administered 2017-11-17 – 2017-11-22 (×7): 20 mg via ORAL
  Filled 2017-11-16 (×6): qty 1

## 2017-11-16 MED ORDER — TRIAMCINOLONE ACETONIDE 55 MCG/ACT NA AERO
2.0000 | INHALATION_SPRAY | Freq: Every day | NASAL | Status: DC
  Administered 2017-11-17 – 2017-11-24 (×8): 2 via NASAL
  Filled 2017-11-16: qty 21.6

## 2017-11-16 MED ORDER — ALBUTEROL SULFATE (2.5 MG/3ML) 0.083% IN NEBU: 2.5000 mg | INHALATION_SOLUTION | Freq: Four times a day (QID) | RESPIRATORY_TRACT | Status: DC

## 2017-11-16 MED ORDER — METOPROLOL SUCCINATE ER 25 MG PO TB24
25.0000 mg | ORAL_TABLET | Freq: Every day | ORAL | Status: DC
  Administered 2017-11-17 – 2017-11-24 (×8): 25 mg via ORAL
  Filled 2017-11-16 (×9): qty 1

## 2017-11-16 MED ORDER — ACETAMINOPHEN 325 MG PO TABS
650.0000 mg | ORAL_TABLET | Freq: Two times a day (BID) | ORAL | Status: DC
  Administered 2017-11-17 – 2017-11-24 (×14): 650 mg via ORAL
  Filled 2017-11-16 (×14): qty 2

## 2017-11-16 MED ORDER — ENOXAPARIN SODIUM 30 MG/0.3ML ~~LOC~~ SOLN
30.0000 mg | Freq: Every day | SUBCUTANEOUS | Status: DC
  Administered 2017-11-17 – 2017-11-23 (×7): 30 mg via SUBCUTANEOUS
  Filled 2017-11-16 (×7): qty 0.3

## 2017-11-16 MED ORDER — DARIFENACIN HYDROBROMIDE ER 7.5 MG PO TB24
7.5000 mg | ORAL_TABLET | Freq: Every day | ORAL | Status: DC
  Administered 2017-11-17 – 2017-11-24 (×8): 7.5 mg via ORAL
  Filled 2017-11-16 (×8): qty 1

## 2017-11-16 MED ORDER — MAGNESIUM OXIDE 400 (241.3 MG) MG PO TABS
400.0000 mg | ORAL_TABLET | Freq: Every day | ORAL | Status: DC
  Administered 2017-11-17 – 2017-11-23 (×7): 400 mg via ORAL
  Filled 2017-11-16 (×7): qty 1

## 2017-11-16 NOTE — ED Notes (Signed)
Patient transported to CT 

## 2017-11-16 NOTE — ED Provider Notes (Addendum)
[facility] DEPARTMENT Provider Note   CSN: [id number] Arrival date & time: 11/16/17  [place]     History   Chief Complaint Chief Complaint  Patient presents with  . Weakness    HPI Pernella Ackerley Keeler is a 84 y.o. female.  84 yo F with a chief complaint of a cough and shortness of breath.  This been going on for the past week and worsening over the past couple days.  She began complaining to the nurses at the assisted living facility that she was having trouble breathing worse this afternoon and they made a decision to send her here.  Patient has had a precipitous decline over the past few months.  She had a fall and since then has been on chronic narcotics and has not wanted to get out of the chair due to pain all over her body.  Family denies fevers or chills.  Such has been coughing quite a bit over the past week.  Unsure of sick contacts.  She is stopped eating and drinking over the past week as well.  There is some thought that she was having some difficulty with sensation to the left side of her body as well.  Patient does not remember that happening.  Complains of a cough but nothing else currently.  The history is provided by the patient.  Weakness  Primary symptoms include no dizziness. Associated symptoms include shortness of breath. Pertinent negatives include no chest pain, no vomiting and no headaches.  Illness  This is a new problem. The current episode started more than 1 week ago. The problem occurs constantly. The problem has been rapidly worsening. Associated symptoms include shortness of breath. Pertinent negatives include no chest pain, no abdominal pain and no headaches. Nothing aggravates the symptoms. Nothing relieves the symptoms. She has tried nothing for the symptoms. The treatment provided no relief.    Past Medical History:  Diagnosis Date  . Acid reflux   . Anemia of chronic renal failure, stage 3 (moderate) ([place]) 03/21/2014  .  Arthritis   . Dementia ([place]) 11/19/2014  . Diabetes ([place])   . High blood pressure   . High cholesterol   . Hypothyroidism   . Iron deficiency anemia 03/21/2014  . TIA (transient ischemic attack)     Patient Active Problem List   Diagnosis Date Noted  . Hypoxemia 11/16/2017  . Degenerative joint disease of knee, right 05/31/2017  . Vitamin D deficiency 02/09/2017  . Rash 02/09/2017  . Low back pain 02/09/2017  . Elevated PTHrP level 09/28/2016  . Cough 07/14/2016  . Right leg swelling 03/30/2016  . Dysphagia 03/30/2016  . Urinary incontinence 03/30/2016  . Chronic pain 03/30/2016  . Bilateral shoulder pain 03/30/2016  . Left lumbar radiculitis 02/06/2015  . Moderate dementia without behavioral disturbance ([place]) 01/01/2015  . Essential hypertension 01/01/2015  . Other specified hypothyroidism 01/01/2015  . Type 2 diabetes mellitus without complication, without long-term current use of insulin ([place]) 01/01/2015  . Rotator cuff tear arthropathy of both shoulders 12/09/2014  . Dementia ([place]) 11/19/2014  . Constipation 11/19/2014  . Right shoulder pain 11/19/2014  . Preventative health care 09/05/2014  . Hyperlipidemia 09/05/2014  . Urinary frequency 09/05/2014  . Allergic rhinitis 09/05/2014  . Hypothyroidism 03/22/2014  . TIA (transient ischemic attack) 03/22/2014  . DM type 2 (diabetes mellitus, type 2) ([place]) 03/22/2014  . GERD (gastroesophageal reflux disease) 03/22/2014  . Essential hypertension, benign 03/22/2014  . Neck pain, chronic 03/22/2014  .  Diverticulosis of colon without hemorrhage 03/22/2014  . Iron deficiency anemia 03/21/2014  . Anemia of chronic renal failure, stage 3 (moderate) ([place]) 03/21/2014    Past Surgical History:  Procedure Laterality Date  . ANTERIOR (CYSTOCELE) AND POSTERIOR REPAIR (RECTOCELE) WITH XENFORM GRAFT AND SACROSPINOUS FIXATION    . BLADDER SUSPENSION    . BREAST BIOPSY    . CATARACT EXTRACTION    . CHOLECYSTECTOMY  2013  .  COLONOSCOPY  05/02/2007  . ERCP  2014  . TOTAL HIP ARTHROPLASTY Left 05/06/2008  . VAGINAL HYSTERECTOMY       OB History   None      Home Medications    Prior to Admission medications   Medication Sig Start Date End Date Taking? Authorizing Provider  darifenacin (ENABLEX) 7.5 MG 24 hr tablet Take 1 tablet (7.5 mg total) by mouth daily. 05/01/15  Yes Biagio Borg, MD  donepezil (ARICEPT) 10 MG tablet Take 1 tablet (10 mg total) by mouth daily. 06/10/17  Yes Cameron Sprang, MD  guaifenesin (ROBITUSSIN) 100 MG/5ML syrup Take 15 mLs by mouth 2 (two) times daily.   Yes [provider]  levothyroxine (SYNTHROID, LEVOTHROID) 100 MCG tablet TAKE ONE TABLET BY MOUTH ONCE DAILY. Patient taking differently: Take 100 mcg by mouth daily.  07/28/16  Yes Biagio Borg, MD  metoprolol succinate (TOPROL-XL) 50 MG 24 hr tablet Take 1 tablet (50 mg total) by mouth daily. Take with or immediately following a meal. Patient taking differently: Take 25 mg by mouth daily. Take with or immediately following a meal. 09/19/17  Yes Biagio Borg, MD  acetaminophen (TYLENOL) 325 MG tablet Take 650 mg by mouth 2 (two) times daily.    [provider]  albuterol (PROVENTIL HFA;VENTOLIN HFA) 108 (90 Base) MCG/ACT inhaler Inhale 2 puffs into the lungs 4 (four) times daily. 11/15/17   [provider]  atropine 1 % ophthalmic solution Place 4 drops under the tongue every 4 (four) hours as needed (excessive secretions). 11/24/17   Lavina Hamman, MD  HYDROcodone-acetaminophen (NORCO/VICODIN) 5-325 MG tablet Take 1 tablet by mouth every 6 (six) hours as needed for moderate pain. 11/22/17   Domenic Polite, MD  LORazepam (ATIVAN) 2 MG/ML concentrated solution Place 0.5 mLs (1 mg total) under the tongue every 4 (four) hours as needed for anxiety. 11/24/17   Lavina Hamman, MD  OLANZapine zydis (ZYPREXA) 5 MG disintegrating tablet Take 1 tablet (5 mg total) by mouth daily. 11/25/17   Lavina Hamman, MD    oxyCODONE (ROXICODONE) 5 MG/5ML solution Take 5 mLs (5 mg total) by mouth every 4 (four) hours as needed for severe pain. 11/24/17   Lavina Hamman, MD  senna-docusate (SENOKOT-S) 8.6-50 MG per tablet Take 1-2 tablets, IF NEEDED, every 12 hrs for constipation Patient taking differently: Take 1 tablet by mouth every 12 (twelve) hours as needed for mild constipation.  04/11/14   Volanda Napoleon, MD    Family History Family History  Problem Relation Age of Onset  . Heart attack Father   . Arthritis Mother   . Cancer Sister   . Esophageal cancer Daughter   . Seizures Daughter        As a teenager  . Colon cancer Son     Social History Social History   Tobacco Use  . Smoking status: Former Smoker    Packs/day: 1.00    Years: 25.00    Pack years: 25.00    Types: Cigarettes  Last attempt to quit: 04/11/1970    Years since quitting: 47.6  . Smokeless tobacco: Never Used  . Tobacco comment: quit 42 years ago  Substance Use Topics  . Alcohol use: No    Alcohol/week: 0.0 standard drinks  . Drug use: No     Allergies   Allegra allergy [fexofenadine hcl]; Amoxicillin; Penicillins; and Sulfa antibiotics   Review of Systems Review of Systems  Constitutional: Negative for chills and fever.  HENT: Negative for congestion and rhinorrhea.   Eyes: Negative for redness and visual disturbance.  Respiratory: Positive for shortness of breath. Negative for wheezing.   Cardiovascular: Negative for chest pain and palpitations.  Gastrointestinal: Negative for abdominal pain, nausea and vomiting.  Genitourinary: Negative for dysuria and urgency.  Musculoskeletal: Negative for arthralgias and myalgias.  Skin: Negative for pallor and wound.  Neurological: Positive for weakness. Negative for dizziness and headaches.     Physical Exam Updated Vital Signs BP (!) 162/49 (BP Location: Right Arm)   Pulse 62   Temp 98.2 F (36.8 C) (Oral)   Resp 14   Ht 5\' 3"  (1.6 m)   Wt 47.5 kg    SpO2 95%   BMI 18.55 kg/m   Physical Exam  Constitutional: She is oriented to person, place, and time. She appears well-developed and well-nourished. No distress.  HENT:  Head: Normocephalic and atraumatic.  Eyes: Pupils are equal, round, and reactive to light. EOM are normal.  Neck: Normal range of motion. Neck supple.  Cardiovascular: Normal rate and regular rhythm. Exam reveals no gallop and no friction rub.  No murmur heard. Pulmonary/Chest: Effort normal. She has no wheezes. She has no rales.  Diminished breath sounds to the RLL field  Abdominal: Soft. She exhibits no distension and no mass. There is no tenderness. There is no guarding.  Musculoskeletal: She exhibits no edema or tenderness.  Neurological: She is alert and oriented to person, place, and time.  Skin: Skin is warm and dry. She is not diaphoretic.  Psychiatric: She has a normal mood and affect. Her behavior is normal.  Nursing note and vitals reviewed.    ED Treatments / Results  Labs (all labs ordered are listed, but only abnormal results are displayed) Labs Reviewed  BASIC METABOLIC PANEL - Abnormal; Notable for the following components:      Result Value   Sodium 134 (*)    Glucose, Bld 130 (*)    BUN 43 (*)    Creatinine, Ser 2.28 (*)    Calcium 12.8 (*)    GFR calc non Af Amer 17 (*)    GFR calc Af Amer 20 (*)    All other components within normal limits  CBC - Abnormal; Notable for the following components:   RBC 2.16 (*)    Hemoglobin 7.4 (*)    HCT 23.9 (*)    MCV 110.6 (*)    MCH 34.3 (*)    All other components within normal limits  URINALYSIS, ROUTINE W REFLEX MICROSCOPIC - Abnormal; Notable for the following components:   APPearance CLOUDY (*)    Hgb urine dipstick SMALL (*)    Protein, ur 30 (*)    Bacteria, UA MANY (*)    All other components within normal limits  BASIC METABOLIC PANEL - Abnormal; Notable for the following components:   Potassium 3.2 (*)    Glucose, Bld 126 (*)     BUN 34 (*)    Creatinine, Ser 2.01 (*)    Calcium 12.3 (*)  GFR calc non Af Amer 20 (*)    GFR calc Af Amer 23 (*)    All other components within normal limits  CBC - Abnormal; Notable for the following components:   RBC 2.03 (*)    Hemoglobin 6.9 (*)    HCT 21.9 (*)    MCV 107.9 (*)    All other components within normal limits  CBC - Abnormal; Notable for the following components:   RBC 3.35 (*)    Hemoglobin 10.2 (*)    HCT 32.8 (*)    RDW 21.7 (*)    Platelets 479 (*)    nRBC 0.4 (*)    All other components within normal limits  VITAMIN B12 - Abnormal; Notable for the following components:   Vitamin B-12 3,660 (*)    All other components within normal limits  RETICULOCYTES - Abnormal; Notable for the following components:   RBC. 2.91 (*)    Immature Retic Fract 26.9 (*)    All other components within normal limits  CBC - Abnormal; Notable for the following components:   RBC 3.36 (*)    Hemoglobin 10.4 (*)    HCT 32.3 (*)    RDW 21.6 (*)    Platelets 479 (*)    nRBC 0.8 (*)    All other components within normal limits  BASIC METABOLIC PANEL - Abnormal; Notable for the following components:   Potassium 3.3 (*)    Glucose, Bld 154 (*)    BUN 25 (*)    Creatinine, Ser 1.71 (*)    Calcium 12.9 (*)    GFR calc non Af Amer 25 (*)    GFR calc Af Amer 29 (*)    All other components within normal limits  CBC - Abnormal; Notable for the following components:   WBC 3.6 (*)    RBC 3.13 (*)    Hemoglobin 9.3 (*)    HCT 31.2 (*)    MCHC 29.8 (*)    RDW 22.0 (*)    Platelets 452 (*)    nRBC 1.1 (*)    All other components within normal limits  BASIC METABOLIC PANEL - Abnormal; Notable for the following components:   BUN 34 (*)    Creatinine, Ser 2.21 (*)    Calcium 12.9 (*)    GFR calc non Af Amer 18 (*)    GFR calc Af Amer 21 (*)    All other components within normal limits  GLUCOSE, CAPILLARY - Abnormal; Notable for the following components:   Glucose-Capillary  100 (*)    All other components within normal limits  URINALYSIS, ROUTINE W REFLEX MICROSCOPIC - Abnormal; Notable for the following components:   APPearance CLOUDY (*)    Hgb urine dipstick SMALL (*)    Protein, ur 100 (*)    WBC, UA >50 (*)    Bacteria, UA RARE (*)    All other components within normal limits  CBC - Abnormal; Notable for the following components:   RBC 2.84 (*)    Hemoglobin 8.6 (*)    HCT 28.6 (*)    MCV 100.7 (*)    RDW 21.8 (*)    Platelets 467 (*)    nRBC 0.8 (*)    All other components within normal limits  BASIC METABOLIC PANEL - Abnormal; Notable for the following components:   Glucose, Bld 126 (*)    BUN 44 (*)    Creatinine, Ser 2.30 (*)    Calcium 13.0 (*)    GFR  calc non Af Amer 17 (*)    GFR calc Af Amer 20 (*)    Anion gap 4 (*)    All other components within normal limits  CBC - Abnormal; Notable for the following components:   RBC 2.80 (*)    Hemoglobin 8.6 (*)    HCT 28.2 (*)    MCV 100.7 (*)    RDW 21.3 (*)    Platelets 465 (*)    nRBC 0.8 (*)    All other components within normal limits  BASIC METABOLIC PANEL - Abnormal; Notable for the following components:   Glucose, Bld 114 (*)    BUN 36 (*)    Creatinine, Ser 1.82 (*)    Calcium 12.7 (*)    GFR calc non Af Amer 23 (*)    GFR calc Af Amer 26 (*)    Anion gap 3 (*)    All other components within normal limits  CBC - Abnormal; Notable for the following components:   RBC 2.83 (*)    Hemoglobin 8.8 (*)    HCT 29.0 (*)    MCV 102.5 (*)    RDW 21.0 (*)    Platelets 496 (*)    nRBC 0.6 (*)    All other components within normal limits  BASIC METABOLIC PANEL - Abnormal; Notable for the following components:   Glucose, Bld 121 (*)    BUN 31 (*)    Creatinine, Ser 1.79 (*)    Calcium 13.4 (*)    GFR calc non Af Amer 23 (*)    GFR calc Af Amer 27 (*)    All other components within normal limits  PTH, INTACT AND CALCIUM - Abnormal; Notable for the following components:   PTH  274 (*)    Calcium, Total (PTH) 13.3 (*)    All other components within normal limits  BASIC METABOLIC PANEL - Abnormal; Notable for the following components:   BUN 29 (*)    Creatinine, Ser 1.69 (*)    Calcium 12.3 (*)    GFR calc non Af Amer 25 (*)    GFR calc Af Amer 29 (*)    All other components within normal limits  CBC - Abnormal; Notable for the following components:   RBC 2.65 (*)    Hemoglobin 8.1 (*)    HCT 27.1 (*)    MCV 102.3 (*)    MCHC 29.9 (*)    RDW 20.8 (*)    Platelets 530 (*)    nRBC 0.7 (*)    All other components within normal limits  CBG MONITORING, ED - Abnormal; Notable for the following components:   Glucose-Capillary 132 (*)    All other components within normal limits  CULTURE, BLOOD (ROUTINE X 2)  CULTURE, BLOOD (ROUTINE X 2)  RESPIRATORY PANEL BY PCR  URINE CULTURE  INFLUENZA PANEL BY PCR (TYPE A & B)  PROCALCITONIN  FOLATE  IRON AND TIBC  FERRITIN  VITAMIN D 25 HYDROXY (VIT D DEFICIENCY, FRACTURES)  I-STAT TROPONIN, ED  I-STAT CG4 LACTIC ACID, ED  TYPE AND SCREEN  PREPARE RBC (CROSSMATCH)  ABO/RH    EKG EKG Interpretation  Date/Time:  Wednesday November 16 2017 18:58:54 EDT Ventricular Rate:  60 PR Interval:    QRS Duration: 103 QT Interval:  427 QTC Calculation: 427 R Axis:   -29 Text Interpretation:  Sinus rhythm Inferior infarct, old No significant change since last tracing 18 Sep 2017 Confirmed by Rolland Porter [phone]) on 11/17/2017 7:13:46 PM  Radiology No results found.  Procedures Procedures (including critical care time)  Medications Ordered in ED Medications  0.9 %  sodium chloride infusion ( Intravenous Stopped 11/20/17 2146)  sodium chloride 0.9 % bolus 1,000 mL (0 mLs Intravenous Stopped 11/16/17 2057)  doxycycline (VIBRAMYCIN) 100 mg in sodium chloride 0.9 % 250 mL IVPB (0 mg Intravenous Stopped 11/17/17 0025)  levofloxacin (LEVAQUIN) IVPB 500 mg (500 mg Intravenous New Bag/Given 11/17/17 0123)  haloperidol  lactate (HALDOL) injection 2.5 mg (2.5 mg Intravenous Given 11/17/17 0116)  furosemide (LASIX) injection 20 mg (20 mg Intravenous Given 11/18/17 1355)  potassium chloride SA (K-DUR,KLOR-CON) CR tablet 40 mEq (40 mEq Oral Given 11/18/17 2105)  cefdinir (OMNICEF) capsule 300 mg (300 mg Oral Given 11/24/17 1121)  zolendronic acid (ZOMETA) 4 mg in sodium chloride 0.9 % 100 mL IVPB (4 mg Intravenous New Bag/Given 11/22/17 1322)  calcitonin (MIACALCIN) injection 190 Units (190 Units Intramuscular Given 11/22/17 1214)     Initial Impression / Assessment and Plan / ED Course  I have reviewed the triage vital signs and the nursing notes.  Pertinent labs & imaging results that were available during my care of the patient were reviewed by me and considered in my medical decision making (see chart for details).     84 yo F with a cc of shortness of breath and cough.  Patient was found to be hypoxic on arrival at rest had an O2 sat in the mid 80s.  Not normally on oxygen was placed on 4 L to get her up to 92%.  Will obtain a chest x-ray lab work blood cultures urine give IV fluids and reassess.  CXR viewed by me with likely RLL infiltrate, seen posteriorly as well on lateral.  Clinically with pna with hypoxia, cough.  Will screen for influenza.  Start on doxy.  Discuss with hospitalist.   CRITICAL CARE Performed by: Cecilio Asper   Total critical care time: 35 minutes  Critical care time was exclusive of separately billable procedures and treating other patients.  Critical care was necessary to treat or prevent imminent or life-threatening deterioration.  Critical care was time spent personally by me on the following activities: development of treatment plan with patient and/or surrogate as well as nursing, discussions with consultants, evaluation of patient's response to treatment, examination of patient, obtaining history from patient or surrogate, ordering and performing treatments and  interventions, ordering and review of laboratory studies, ordering and review of radiographic studies, pulse oximetry and re-evaluation of patient's condition.   The patients results and plan were reviewed and discussed.   Any x-rays performed were independently reviewed by myself.   Differential diagnosis were considered with the presenting HPI.  Medications  0.9 %  sodium chloride infusion ( Intravenous Stopped 11/20/17 2146)  sodium chloride 0.9 % bolus 1,000 mL (0 mLs Intravenous Stopped 11/16/17 2057)  doxycycline (VIBRAMYCIN) 100 mg in sodium chloride 0.9 % 250 mL IVPB (0 mg Intravenous Stopped 11/17/17 0025)  levofloxacin (LEVAQUIN) IVPB 500 mg (500 mg Intravenous New Bag/Given 11/17/17 0123)  haloperidol lactate (HALDOL) injection 2.5 mg (2.5 mg Intravenous Given 11/17/17 0116)  furosemide (LASIX) injection 20 mg (20 mg Intravenous Given 11/18/17 1355)  potassium chloride SA (K-DUR,KLOR-CON) CR tablet 40 mEq (40 mEq Oral Given 11/18/17 2105)  cefdinir (OMNICEF) capsule 300 mg (300 mg Oral Given 11/24/17 1121)  zolendronic acid (ZOMETA) 4 mg in sodium chloride 0.9 % 100 mL IVPB (4 mg Intravenous New Bag/Given 11/22/17 1322)  calcitonin (MIACALCIN) injection 190  Units (190 Units Intramuscular Given 11/22/17 1214)    Vitals:   11/23/17 2011 11/23/17 2129 11/24/17 0736 11/24/17 0905  BP: (!) 165/50  (!) 162/49   Pulse: 66 64 62   Resp: 16 14    Temp: 99 F (37.2 C)  98.2 F (36.8 C)   TempSrc: Oral  Oral   SpO2: 94% 93% 93% 95%  Weight:      Height:        Final diagnoses:  Community acquired pneumonia of right lower lobe of lung ([place])    Admission/ observation were discussed with the admitting physician, patient and/or family and they are comfortable with the plan.    Final Clinical Impressions(s) / ED Diagnoses   Final diagnoses:  Community acquired pneumonia of right lower lobe of lung [facility])    ED Discharge Orders         Ordered    OLANZapine zydis (ZYPREXA)  5 MG disintegrating tablet  Daily     11/24/17 1213    atropine 1 % ophthalmic solution  Every 4 hours PRN     11/24/17 1213    LORazepam (ATIVAN) 2 MG/ML concentrated solution  Every 4 hours PRN     11/24/17 1213    oxyCODONE (ROXICODONE) 5 MG/5ML solution  Every 4 hours PRN     11/24/17 1213    Increase activity slowly     11/24/17 1349    cefdinir (OMNICEF) 300 MG capsule  Daily,   Status:  Discontinued     11/22/17 0949    HYDROcodone-acetaminophen (NORCO/VICODIN) 5-325 MG tablet  Every 6 hours PRN     11/22/17 0949    hydrALAZINE (APRESOLINE) 10 MG tablet  3 times daily,   Status:  Discontinued     11/22/17 0949    benzonatate (TESSALON) 100 MG capsule  2 times daily,   Status:  Discontinued     11/22/17 0949    Increase activity slowly     11/22/17 0949    Diet - low sodium heart healthy     11/22/17 [place], Yarianna Varble, DO 11/16/17 [place], [place], DO 11/28/17 [phone]

## 2017-11-16 NOTE — ED Notes (Signed)
ED Provider at bedside. 

## 2017-11-16 NOTE — ED Notes (Signed)
Patient transported to X-ray 

## 2017-11-16 NOTE — H&P (Signed)
History and Physical    [facility] GGY:[id number] DOB: 08-Feb-1922 DOA: 11/16/2017  PCP: Jacelyn Pi, NP   Patient coming from: Assisted living  Chief Complaint: Dyspnea and cough.   HPI: Allison Page is a 84 y.o. female with medical history significant of osteoarthritis, dementia, type 2 diabetes mellitus, hypertension, dyslipidemia, hypothyroidism, iron deficiency anemia, history of transitory ischemic attacks, chronic kidney disease stage III, and anemia of chronic disease.  About 7 days ago she was diagnosed with bronchitis, she had an inhaler/bronchodilator and a cough suppressive agent prescribed.  Her symptoms continued to deteriorate, consistent with productive cough, congestion, and wheezing.  On the day of admission she had severe dyspnea, no improving or worsening factors, associated with confusion more than her baseline.  Due to her worsening symptoms she was brought to the hospital for evaluation.  At her baseline her physical functional capacity is very limited due to her osteoarthritis.  Denies any fever or sick contacts.   ED Course: Patient was found hypoxic in the emergency department (ED report), that slowly improved with supplemental oxygen.  X-ray was suspicious for pneumonia, she was referred for admission for evaluation.  Review of Systems:  1. General: No fevers, no chills, no weight gain or weight loss, generalized weakness, worsening confusion, compared to baseline. 2. ENT: No runny nose or sore throat, no hearing disturbances 3. Pulmonary: Positive dyspnea, with productive cough, and wheezing, no hemoptysis 4. Cardiovascular: No angina, claudication, positive lower extremity edema, but not pnd or orthopnea 5. Gastrointestinal: No nausea or vomiting, no diarrhea or constipation 6. Hematology: No easy bruisability or frequent infections 7. Urology: No dysuria, hematuria or increased urinary frequency 8. Dermatology: No rashes. 9. Neurology: No  seizures or paresthesias 10. Musculoskeletal: polyarthralgia, extremities and back.   Past Medical History:  Diagnosis Date  . Acid reflux   . Anemia of chronic renal failure, stage 3 (moderate) ([place]) 03/21/2014  . Arthritis   . Dementia ([place]) 11/19/2014  . Diabetes ([place])   . High blood pressure   . High cholesterol   . Hypothyroidism   . Iron deficiency anemia 03/21/2014  . TIA (transient ischemic attack)     Past Surgical History:  Procedure Laterality Date  . ANTERIOR (CYSTOCELE) AND POSTERIOR REPAIR (RECTOCELE) WITH XENFORM GRAFT AND SACROSPINOUS FIXATION    . BLADDER SUSPENSION    . BREAST BIOPSY    . CATARACT EXTRACTION    . CHOLECYSTECTOMY  2013  . COLONOSCOPY  05/02/2007  . ERCP  2014  . TOTAL HIP ARTHROPLASTY Left 05/06/2008  . VAGINAL HYSTERECTOMY       reports that she quit smoking about 47 years ago. Her smoking use included cigarettes. She has a 25.00 pack-year smoking history. She has never used smokeless tobacco. She reports that she does not drink alcohol or use drugs.  Allergies  Allergen Reactions  . Allegra Allergy [Fexofenadine Hcl] Anaphylaxis  . Amoxicillin   . Penicillins   . Sulfa Antibiotics     Family History  Problem Relation Age of Onset  . Heart attack Father   . Arthritis Mother   . Cancer Sister   . Esophageal cancer Daughter   . Seizures Daughter        As a teenager  . Colon cancer Son      Prior to Admission medications   Medication Sig Start Date End Date Taking? Authorizing Provider  amLODipine (NORVASC) 10 MG tablet TAKE (1/2) TABLET BY MOUTH ONCE DAILY. Patient taking differently: Take  10 mg by mouth daily.  08/02/16  Yes Biagio Borg, MD  aspirin 81 MG tablet TAKE ONE TABLET BY MOUTH ONCE DAILY. Patient taking differently: Take 81 mg by mouth daily.  08/12/16  Yes Biagio Borg, MD  atorvastatin (LIPITOR) 20 MG tablet TAKE (1) TABLET BY MOUTH AT BEDTIME. Patient taking differently: Take 20 mg by mouth at bedtime.  08/02/16  Yes  Biagio Borg, MD  Cholecalciferol (VITAMIN D) 2000 units tablet TAKE (1) TABLET BY MOUTH ONCE DAILY. Patient taking differently: Take 2,000 Units by mouth daily.  09/13/17  Yes Biagio Borg, MD  darifenacin (ENABLEX) 7.5 MG 24 hr tablet Take 1 tablet (7.5 mg total) by mouth daily. 05/01/15  Yes Biagio Borg, MD  docusate (COLACE) 50 MG/5ML liquid Take 100 mg by mouth daily.   Yes [provider]  donepezil (ARICEPT) 10 MG tablet Take 1 tablet (10 mg total) by mouth daily. 06/10/17  Yes Cameron Sprang, MD  guaifenesin (ROBITUSSIN) 100 MG/5ML syrup Take 15 mLs by mouth 2 (two) times daily.   Yes [provider]  hydrALAZINE (APRESOLINE) 50 MG tablet Take 1 tablet (50 mg total) by mouth 3 (three) times daily. 08/26/16  Yes Biagio Borg, MD  levothyroxine (SYNTHROID, LEVOTHROID) 100 MCG tablet TAKE ONE TABLET BY MOUTH ONCE DAILY. Patient taking differently: Take 100 mcg by mouth daily.  07/28/16  Yes Biagio Borg, MD  magnesium oxide (MAG-OX) 400 MG tablet Take 400 mg by mouth daily.   Yes [provider]  Melatonin 3 MG SUBL Place 3 mg under the tongue at bedtime.   Yes [provider]  metoprolol succinate (TOPROL-XL) 50 MG 24 hr tablet Take 1 tablet (50 mg total) by mouth daily. Take with or immediately following a meal. Patient taking differently: Take 25 mg by mouth daily. Take with or immediately following a meal. 09/19/17  Yes Biagio Borg, MD  mupirocin ointment (BACTROBAN) 2 % Apply 1 application topically 3 (three) times daily. 11/09/17  Yes [provider]  NASACORT ALLERGY 24HR 55 MCG/ACT AERO nasal inhaler SPRAY 2 SPRAYS INTO EACH NOSTRIL DAILY. Patient taking differently: Place 2 sprays into the nose daily.  08/02/16  Yes Biagio Borg, MD  pantoprazole (PROTONIX) 40 MG tablet TAKE (1) TABLET BY MOUTH TWICE DAILY 30-60 MINUTES BEFORE BREAKFAST AND DINNER. Patient taking differently: Take 40 mg by mouth 2 (two) times daily before a meal.  08/16/16   Yes Biagio Borg, MD  polyethylene glycol [facility] Floria Raveling) packet Take 17 g by mouth daily. 09/28/16  Yes Biagio Borg, MD  trolamine salicylate (ASPERCREME) 10 % cream Apply 1 application topically 3 (three) times daily.   Yes [provider]  vitamin B-12 (CYANOCOBALAMIN) 1000 MCG tablet TAKE ONE TABLET BY MOUTH ONCE DAILY. Patient taking differently: Take 1,000 mcg by mouth daily.  08/11/16  Yes Biagio Borg, MD  acetaminophen (TYLENOL) 325 MG tablet Take 650 mg by mouth 2 (two) times daily.    [provider]  acetaminophen (TYLENOL) 500 MG tablet Take 1 tablet (500 mg total) by mouth every 6 (six) hours as needed. Patient not taking: Reported on 11/16/2017 08/06/15   Biagio Borg, MD  Acetaminophen-Codeine (TYLENOL/CODEINE #3) 300-30 MG tablet Take 1 tablet by mouth every 6 (six) hours as needed for pain. Patient not taking: Reported on 11/16/2017 08/09/17   Biagio Borg, MD  albuterol (PROVENTIL HFA;VENTOLIN HFA) 108 [phone] Base) MCG/ACT inhaler Inhale 2  puffs into the lungs 4 (four) times daily. 11/15/17   [provider]  conjugated estrogens (PREMARIN) vaginal cream Place 1 Applicatorful vaginally daily. Patient not taking: Reported on 11/16/2017 03/30/16   Biagio Borg, MD  fexofenadine (ALLEGRA) 180 MG tablet Take 180 mg by mouth daily.    [provider]  GAVILAX powder MIX 1 CAPFUL (17G) IN 8 OUNCES OF JUICE/WATER AND DRINK ONCE DAILY. Patient not taking: Reported on 11/16/2017 07/19/17   Biagio Borg, MD  Glucose Blood (BLOOD GLUCOSE TEST STRIPS) STRP Use as directed once weekly E11.9 09/01/17   Biagio Borg, MD  guaiFENesin (MUCINEX) 600 MG 12 hr tablet Take 1 tablet (600 mg total) by mouth 2 (two) times daily as needed. Patient taking differently: Take 600 mg by mouth 2 (two) times daily as needed for to loosen phlegm.  08/24/17   Biagio Borg, MD  hydrALAZINE (APRESOLINE) 50 MG tablet TAKE 1 TABLET BY MOUTH THREE TIMES A DAY. 08/16/17   Biagio Borg, MD  HYDROcodone-acetaminophen (NORCO/VICODIN) 5-325 MG tablet Take 1 tablet by mouth 4 (four) times daily. 10/26/17   [provider]  LORazepam (ATIVAN) 0.5 MG tablet Take 0.5 tablets by mouth daily. 10/28/17   [provider]  oseltamivir (TAMIFLU) 75 MG capsule Take 1 capsule (75 mg total) by mouth 2 (two) times daily. Patient not taking: Reported on 11/16/2017 03/14/17   Biagio Borg, MD  predniSONE (DELTASONE) 10 MG tablet Take 2 tablets (20 mg total) by mouth daily with breakfast. Patient not taking: Reported on 11/16/2017 09/13/17   Elnora Morrison, MD  senna-docusate (SENOKOT-S) 8.6-50 MG per tablet Take 1-2 tablets, IF NEEDED, every 12 hrs for constipation Patient taking differently: Take 1 tablet by mouth every 12 (twelve) hours as needed for mild constipation.  04/11/14   Volanda Napoleon, MD  traMADol (ULTRAM) 50 MG tablet TAKE (1) TABLET BY MOUTH 4 TIMES DAILY. Patient not taking: Reported on 11/16/2017 06/06/17   Biagio Borg, MD  triamcinolone cream (KENALOG) 0.1 % Apply 1 application topically 2 (two) times daily as needed. Patient not taking: Reported on 11/16/2017 02/09/17 02/09/18  Biagio Borg, MD    Physical Exam: Vitals:   11/16/17 1859 11/16/17 1930 11/16/17 2000 11/16/17 2100  BP: (!) 153/51 140/62 (!) 146/67 128/78  Pulse: 61 62 (!) 57 67  Resp: 15 18 14 17   Temp: (!) 97.5 F (36.4 C)     TempSrc: Oral     SpO2: 98% 96% 98% 96%  Weight:      Height:        Vitals:   11/16/17 1859 11/16/17 1930 11/16/17 2000 11/16/17 2100  BP: (!) 153/51 140/62 (!) 146/67 128/78  Pulse: 61 62 (!) 57 67  Resp: 15 18 14 17   Temp: (!) 97.5 F (36.4 C)     TempSrc: Oral     SpO2: 98% 96% 98% 96%  Weight:      Height:       General: Deconditioned any looking appearing Neurology: Awake, positive disorientation, worse than baseline  Head and Neck. Head normocephalic. Neck supple with no adenopathy or thyromegaly.   E ENT: no pallor, no icterus, oral  mucosa dry.  Cardiovascular: No JVD. S1-S2 present, rhythmic, no gallops, rubs, or murmurs. ++ pitting lower extremity edema. Pulmonary: positive breath sounds bilaterally, decreased air movement, no wheezing, but scattered bilateral rhonchi and rales. Gastrointestinal. Abdomen with no organomegaly, non tender, no rebound or guarding Skin. Left distal  medial leg with erythematous region, tender to palpation, ill defined margins, about 3 cm diameter.  Musculoskeletal: positive joint deformities, finger and knees.     Labs on Admission: I have personally reviewed following labs and imaging studies  CBC: Recent Labs  Lab 11/15/17 1333 11/16/17 1909  WBC 5.2 4.8  NEUTROABS 3.3  --   HGB 7.9* 7.4*  HCT 26.1* 23.9*  MCV 110.6* 110.6*  PLT 375 427   Basic Metabolic Panel: Recent Labs  Lab 11/15/17 1333 11/16/17 1909  NA 135 134*  K 3.9 3.5  CL 110* 104  CO2 26 23  GLUCOSE 161* 130*  BUN 39* 43*  CREATININE 2.20* 2.28*  CALCIUM 12.8* 12.8*   GFR: Estimated Creatinine Clearance: 12.5 mL/min (A) (by C-G formula based on SCr of 2.28 mg/dL (H)). Liver Function Tests: Recent Labs  Lab 11/15/17 1333  AST 21  ALT 16  ALKPHOS 104*  BILITOT 0.4  PROT 7.1  ALBUMIN 3.3*   No results for input(s): LIPASE, AMYLASE in the last 168 hours. No results for input(s): AMMONIA in the last 168 hours. Coagulation Profile: No results for input(s): INR, PROTIME in the last 168 hours. Cardiac Enzymes: No results for input(s): CKTOTAL, CKMB, CKMBINDEX, TROPONINI in the last 168 hours. BNP (last 3 results) No results for input(s): PROBNP in the last 8760 hours. HbA1C: No results for input(s): HGBA1C in the last 72 hours. CBG: Recent Labs  Lab 11/16/17 1928  GLUCAP 132*   Lipid Profile: No results for input(s): CHOL, HDL, LDLCALC, TRIG, CHOLHDL, LDLDIRECT in the last 72 hours. Thyroid Function Tests: No results for input(s): TSH, T4TOTAL, FREET4, T3FREE, THYROIDAB in the last 72  hours. Anemia Panel: No results for input(s): VITAMINB12, FOLATE, FERRITIN, TIBC, IRON, RETICCTPCT in the last 72 hours. Urine analysis:    Component Value Date/Time   COLORURINE YELLOW 09/18/2017 2115   APPEARANCEUR CLOUDY (A) 09/18/2017 2115   APPEARANCEUR Cloudy (A) 07/05/2014 1622   LABSPEC 1.012 09/18/2017 2115   PHURINE 7.0 09/18/2017 2115   GLUCOSEU 50 (A) 09/18/2017 2115   GLUCOSEU NEGATIVE 09/28/2016 1500   HGBUR NEGATIVE 09/18/2017 2115   BILIRUBINUR NEGATIVE 09/18/2017 2115   BILIRUBINUR Negative 07/05/2014 1622   KETONESUR NEGATIVE 09/18/2017 2115   PROTEINUR 100 (A) 09/18/2017 2115   UROBILINOGEN 0.2 09/28/2016 1500   NITRITE NEGATIVE 09/18/2017 2115   LEUKOCYTESUR NEGATIVE 09/18/2017 2115   LEUKOCYTESUR Trace (A) 07/05/2014 1622    Radiological Exams on Admission: Dg Chest 2 View  Result Date: 11/16/2017 CLINICAL DATA:  84 year old female with generalized weakness and dysphagia. EXAM: CHEST - 2 VIEW COMPARISON:  Chest radiographs 09/18/2017 and earlier. FINDINGS: Calcified aortic atherosclerosis. Stable cardiac size at the upper limits of normal to mildly increased. Mildly lower lung volumes. No pneumothorax, pulmonary edema, pleural effusion or confluent pulmonary opacity. Osteopenia. No acute osseous abnormality identified. Negative visible bowel gas pattern. IMPRESSION: 1. Lower lung volumes, otherwise no acute cardiopulmonary abnormality. 2.  Aortic Atherosclerosis (ICD10-I70.0). Electronically Signed   By: Genevie Ann M.D.   On: 11/16/2017 21:20   Ct Head Wo Contrast  Result Date: 11/16/2017 CLINICAL DATA:  84 year old female with weakness and dysphagia. EXAM: CT HEAD WITHOUT CONTRAST TECHNIQUE: Contiguous axial images were obtained from the base of the skull through the vertex without intravenous contrast. COMPARISON:  09/18/2017 head CT and earlier. FINDINGS: Brain: Stable cerebral volume. Stable gray-white matter differentiation throughout the brain. No midline  shift, ventriculomegaly, mass effect, evidence of mass lesion, intracranial hemorrhage or evidence of cortically  based acute infarction. No cortical encephalomalacia identified. Vascular: Calcified atherosclerosis at the skull base. Skull: No suspicious intracranial vascular hyperdensity. Sinuses/Orbits: Visualized paranasal sinuses and mastoids are stable and well pneumatized. Other: Stable orbit and scalp soft tissues. IMPRESSION: No acute findings.  Stable non contrast CT appearance of the brain. Electronically Signed   By: Genevie Ann M.D.   On: 11/16/2017 21:09    EKG: Independently reviewed.  Normal sinus rhythm, normal axis, normal intervals, poor R wave progression.   Assessment/Plan Active Problems:   Hypoxemia  84 year old female who presented with worsening dyspnea over the last 7 days, associated with increased productive cough, congestion and worsening confusion compared to her baseline.  On the physical examination she is disoriented, more than her baseline, temperature 97.6, blood pressure 128/39, heart rate 66, respiratory rate 15, oxygen saturation 94% on supplemental oxygen.  She has dry mucous membranes, positive rhonchi and rales bilaterally, abdomen soft, trace lower extremity edema, erythematous lesion on the distal medial left leg.  Sodium 134, potassium 3.5, chloride 104, bicarb 23, glucose 130, BUN 43, creatinine 2.28, white count 4.8, hemoglobin 7.4, hematocrit 23.9, platelets 352.  Head CT with no acute findings.  Her chest x-ray hypoinflated, increased lung markings bilaterally, bibasilar atelectasis.  Patient will be admitted to the hospital with working diagnosis of acute hypoxic respiratory failure due to tracheobronchitis, complicated by metabolic encephalopathy and acute kidney injury.  1.  Acute tracheobronchitis complicated by acute hypoxic respiratory failure, to rule out community-acquired pneumonia.  Patient will be admitted to the medical ward, she will be placed on a  remote telemetry monitor, antibiotic therapy with IV levofloxacin (penicillin allergy), will follow-up chest film in the morning, depending on patient's evolution the antibiotics will be modified or even discontinued.  Gentle hydration with isotonic saline, follow-up chest film in the morning.  Will add bronchodilator therapy with DuoNeb, oximetry monitoring and supplemental oxygen per nasal cannula, target O2 saturation greater 92%.    2.  Metabolic encephalopathy in the setting of chronic dementia.  Patient mentation is worse than her baseline, corroborated by her daughter at the bedside.  Continue neurochecks per unit protocol, hydration with isotonic saline.  Physical therapy evaluation.  Continue donepezil. Continue daily lorazepam.   3.  Acute kidney injury chronic kidney disease stage 3.  Will continue hydration with isotonic saline, likely prerenal acute kidney injury.  Follow-up kidney function in the morning, avoid hypotension and nephrotoxic agents.  4.  Anemia of chronic renal disease.  Admission hemoglobin 7.4, hematocrit 23.9, with follow-up cell count in the morning if below 7 we will proceed with PRBC transfusion.   5.  Hypothyroid.  Continue levothyroxine.  6.  Hypertension.  Will hold all antihypertensives except metoprolol, at home she is taking 25 mg daily.  Will avoid hypotension.  7.  Osteoarthritis.  Continue pain control with oxycodone.  DVT prophylaxis:  enoxaparin Code Status:  dnr   Family Communication: I spoke with patient's family at the bedside and all questions were addressed.   Disposition Plan: Telemetry    Consults called:  None   Admission status:  Inpatient.     Clifford Benninger Gerome Apley MD [facility] Pager [phone]  If 7PM-7AM, please contact night-coverage [email or web address] Password [email or web address]  11/16/2017, 9:55 PM

## 2017-11-16 NOTE — ED Triage Notes (Signed)
Pt from [facility] with ems for generalized weakness and increased dysphagia. Pt has hx of dementia, alert and oriented to self and place. Non productive cough that ems reports is chronic. Pt found to be 89% on room air, 4L applied and pt now 98%. BP 142/50 HR62. Denies any pain just gen fatigue.

## 2017-11-17 ENCOUNTER — Inpatient Hospital Stay (HOSPITAL_COMMUNITY): Payer: Medicare Other

## 2017-11-17 ENCOUNTER — Encounter (HOSPITAL_COMMUNITY): Payer: Self-pay

## 2017-11-17 DIAGNOSIS — Z794 Long term (current) use of insulin: Secondary | ICD-10-CM

## 2017-11-17 DIAGNOSIS — D631 Anemia in chronic kidney disease: Secondary | ICD-10-CM

## 2017-11-17 DIAGNOSIS — E1110 Type 2 diabetes mellitus with ketoacidosis without coma: Secondary | ICD-10-CM

## 2017-11-17 DIAGNOSIS — N183 Chronic kidney disease, stage 3 (moderate): Secondary | ICD-10-CM

## 2017-11-17 LAB — RESPIRATORY PANEL BY PCR
ADENOVIRUS-RVPPCR: NOT DETECTED
Bordetella pertussis: NOT DETECTED
CORONAVIRUS NL63-RVPPCR: NOT DETECTED
CORONAVIRUS OC43-RVPPCR: NOT DETECTED
Chlamydophila pneumoniae: NOT DETECTED
Coronavirus 229E: NOT DETECTED
Coronavirus HKU1: NOT DETECTED
INFLUENZA A-RVPPCR: NOT DETECTED
Influenza B: NOT DETECTED
MYCOPLASMA PNEUMONIAE-RVPPCR: NOT DETECTED
Metapneumovirus: NOT DETECTED
PARAINFLUENZA VIRUS 1-RVPPCR: NOT DETECTED
PARAINFLUENZA VIRUS 3-RVPPCR: NOT DETECTED
PARAINFLUENZA VIRUS 4-RVPPCR: NOT DETECTED
Parainfluenza Virus 2: NOT DETECTED
RESPIRATORY SYNCYTIAL VIRUS-RVPPCR: NOT DETECTED
RHINOVIRUS / ENTEROVIRUS - RVPPCR: NOT DETECTED

## 2017-11-17 LAB — URINALYSIS, ROUTINE W REFLEX MICROSCOPIC
Bilirubin Urine: NEGATIVE
GLUCOSE, UA: NEGATIVE mg/dL
Ketones, ur: NEGATIVE mg/dL
Leukocytes, UA: NEGATIVE
Nitrite: NEGATIVE
PH: 6 (ref 5.0–8.0)
Protein, ur: 30 mg/dL — AB
SPECIFIC GRAVITY, URINE: 1.01 (ref 1.005–1.030)

## 2017-11-17 LAB — IRON AND TIBC
IRON: 76 ug/dL (ref 28–170)
Saturation Ratios: 30 % (ref 10.4–31.8)
TIBC: 252 ug/dL (ref 250–450)
UIBC: 176 ug/dL

## 2017-11-17 LAB — CBC
HCT: 21.9 % — ABNORMAL LOW (ref 36.0–46.0)
HCT: 32.8 % — ABNORMAL LOW (ref 36.0–46.0)
HEMOGLOBIN: 6.9 g/dL — AB (ref 12.0–15.0)
Hemoglobin: 10.2 g/dL — ABNORMAL LOW (ref 12.0–15.0)
MCH: 34 pg (ref 26.0–34.0)
MCH: 30.4 pg (ref 26.0–34.0)
MCHC: 31.5 g/dL (ref 30.0–36.0)
MCHC: 31.1 g/dL (ref 30.0–36.0)
MCV: 107.9 fL — ABNORMAL HIGH (ref 80.0–100.0)
MCV: 97.9 fL (ref 80.0–100.0)
PLATELETS: 344 10*3/uL (ref 150–400)
PLATELETS: 479 10*3/uL — AB (ref 150–400)
RBC: 2.03 MIL/uL — ABNORMAL LOW (ref 3.87–5.11)
RBC: 3.35 MIL/uL — AB (ref 3.87–5.11)
RDW: 14.4 % (ref 11.5–15.5)
RDW: 21.7 % — AB (ref 11.5–15.5)
WBC: 4.8 10*3/uL (ref 4.0–10.5)
WBC: 5.6 10*3/uL (ref 4.0–10.5)
nRBC: 0 % (ref 0.0–0.2)
nRBC: 0.4 % — ABNORMAL HIGH (ref 0.0–0.2)

## 2017-11-17 LAB — RETICULOCYTES
IMMATURE RETIC FRACT: 26.9 % — AB (ref 2.3–15.9)
RBC.: 2.91 MIL/uL — ABNORMAL LOW (ref 3.87–5.11)
RETIC COUNT ABSOLUTE: 49.8 10*3/uL (ref 19.0–186.0)
Retic Ct Pct: 1.7 % (ref 0.4–3.1)

## 2017-11-17 LAB — FERRITIN: Ferritin: 117 ng/mL (ref 11–307)

## 2017-11-17 LAB — BASIC METABOLIC PANEL
ANION GAP: 8 (ref 5–15)
BUN: 34 mg/dL — AB (ref 8–23)
CALCIUM: 12.3 mg/dL — AB (ref 8.9–10.3)
CO2: 22 mmol/L (ref 22–32)
Chloride: 106 mmol/L (ref 98–111)
Creatinine, Ser: 2.01 mg/dL — ABNORMAL HIGH (ref 0.44–1.00)
GFR calc Af Amer: 23 mL/min — ABNORMAL LOW (ref >60)
GFR, EST NON AFRICAN AMERICAN: 20 mL/min — AB (ref >60)
GLUCOSE: 126 mg/dL — AB (ref 70–99)
POTASSIUM: 3.2 mmol/L — AB (ref 3.5–5.1)
Sodium: 136 mmol/L (ref 135–145)

## 2017-11-17 LAB — VITAMIN B12: VITAMIN B 12: 3660 pg/mL — AB (ref 180–914)

## 2017-11-17 LAB — PREPARE RBC (CROSSMATCH)

## 2017-11-17 LAB — PROCALCITONIN: Procalcitonin: 0.61 ng/mL

## 2017-11-17 LAB — FOLATE: Folate: 10.5 ng/mL (ref >5.9)

## 2017-11-17 LAB — ABO/RH: ABO/RH(D): A POS

## 2017-11-17 MED ORDER — HALOPERIDOL LACTATE 5 MG/ML IJ SOLN
2.5000 mg | Freq: Once | INTRAMUSCULAR | Status: AC
  Administered 2017-11-17: 2.5 mg via INTRAVENOUS
  Filled 2017-11-17: qty 1

## 2017-11-17 MED ORDER — SODIUM CHLORIDE 0.9% IV SOLUTION: Freq: Once | INTRAVENOUS | Status: DC

## 2017-11-17 MED ORDER — SODIUM CHLORIDE 0.9 % IV SOLN
1.0000 g | INTRAVENOUS | Status: DC
  Administered 2017-11-17 – 2017-11-20 (×3): 1 g via INTRAVENOUS
  Filled 2017-11-17 (×3): qty 10

## 2017-11-17 MED ORDER — IPRATROPIUM-ALBUTEROL 0.5-2.5 (3) MG/3ML IN SOLN: 3.0000 mL | RESPIRATORY_TRACT | Status: DC | PRN

## 2017-11-17 MED ORDER — GUAIFENESIN 100 MG/5ML PO SOLN
5.0000 mL | ORAL | Status: DC | PRN
  Administered 2017-11-17 – 2017-11-20 (×7): 100 mg via ORAL
  Filled 2017-11-17 (×7): qty 5

## 2017-11-17 MED ORDER — HYDROCODONE-ACETAMINOPHEN 5-325 MG PO TABS
1.0000 | ORAL_TABLET | ORAL | Status: DC | PRN
  Administered 2017-11-17 – 2017-11-23 (×7): 1 via ORAL
  Filled 2017-11-17 (×8): qty 1

## 2017-11-17 MED ORDER — IPRATROPIUM-ALBUTEROL 0.5-2.5 (3) MG/3ML IN SOLN
3.0000 mL | Freq: Three times a day (TID) | RESPIRATORY_TRACT | Status: DC
  Administered 2017-11-17 – 2017-11-20 (×8): 3 mL via RESPIRATORY_TRACT
  Filled 2017-11-17 (×10): qty 3

## 2017-11-17 MED ORDER — LEVOFLOXACIN IN D5W 500 MG/100ML IV SOLN: 500.0000 mg | INTRAVENOUS | Status: DC

## 2017-11-17 MED ORDER — IPRATROPIUM-ALBUTEROL 0.5-2.5 (3) MG/3ML IN SOLN
3.0000 mL | Freq: Four times a day (QID) | RESPIRATORY_TRACT | Status: DC
  Administered 2017-11-17: 3 mL via RESPIRATORY_TRACT
  Filled 2017-11-17: qty 3

## 2017-11-17 MED ORDER — SODIUM CHLORIDE 0.9 % IV SOLN
500.0000 mg | INTRAVENOUS | Status: DC
  Administered 2017-11-17 – 2017-11-18 (×2): 500 mg via INTRAVENOUS
  Filled 2017-11-17 (×2): qty 500

## 2017-11-17 MED ORDER — ORAL CARE MOUTH RINSE
15.0000 mL | Freq: Two times a day (BID) | OROMUCOSAL | Status: DC
  Administered 2017-11-17 – 2017-11-24 (×9): 15 mL via OROMUCOSAL

## 2017-11-17 MED ORDER — SODIUM CHLORIDE 0.9 % IV SOLN: INTRAVENOUS | Status: DC

## 2017-11-17 MED ORDER — LEVOFLOXACIN IN D5W 500 MG/100ML IV SOLN
500.0000 mg | Freq: Once | INTRAVENOUS | Status: AC
  Administered 2017-11-17: 500 mg via INTRAVENOUS
  Filled 2017-11-17: qty 100

## 2017-11-17 MED ORDER — POTASSIUM CHLORIDE CRYS ER 20 MEQ PO TBCR
40.0000 meq | EXTENDED_RELEASE_TABLET | Freq: Once | ORAL | Status: DC
  Filled 2017-11-17: qty 2

## 2017-11-17 NOTE — Progress Notes (Signed)
CRITICAL VALUE ALERT  Critical Value:  Hemoglobin 6.9  Date & Time Notied:  0400 11/17/17  Provider Notified: Schorr, K, NP  Orders Received/Actions taken: Received orders to transfuse 1 unit of blood. Consent for blood transfusion obtain from patient's daughter, Reina Fuse. Consent placed in patient chart.

## 2017-11-17 NOTE — Evaluation (Signed)
Clinical/Bedside Swallow Evaluation Patient Details  Name: Allison Page MRN: [id number] Date of Birth: 05-14-1922  Today's Date: 11/17/2017 Time: SLP Start Time (ACUTE ONLY): 2694 SLP Stop Time (ACUTE ONLY): 0954 SLP Time Calculation (min) (ACUTE ONLY): 16 min  Past Medical History:  Past Medical History:  Diagnosis Date  . Acid reflux   . Anemia of chronic renal failure, stage 3 (moderate) ([place]) 03/21/2014  . Arthritis   . Dementia ([place]) 11/19/2014  . Diabetes ([place])   . High blood pressure   . High cholesterol   . Hypothyroidism   . Iron deficiency anemia 03/21/2014  . TIA (transient ischemic attack)    Past Surgical History:  Past Surgical History:  Procedure Laterality Date  . ANTERIOR (CYSTOCELE) AND POSTERIOR REPAIR (RECTOCELE) WITH XENFORM GRAFT AND SACROSPINOUS FIXATION    . BLADDER SUSPENSION    . BREAST BIOPSY    . CATARACT EXTRACTION    . CHOLECYSTECTOMY  2013  . COLONOSCOPY  05/02/2007  . ERCP  2014  . TOTAL HIP ARTHROPLASTY Left 05/06/2008  . VAGINAL HYSTERECTOMY     HPI:  Pt is a 84 y.o. female with medical history significant of acid reflux, osteoarthritis, dementia, type 2 diabetes mellitus, hypertension, dyslipidemia, hypothyroidism, iron deficiency anemia, history of transitory ischemic attacks, chronic kidney disease stage III, and anemia of chronic disease. She is admitted with worsening dyspnea and cough, AMS. CT Head negative; CXR showed lower lung volumes.   Assessment / Plan / Recommendation Clinical Impression  Pt has no overt signs of aspiration but is highly distractible, requiring Max cues and encouragement to consume minimal amounts of food or drink. Mastication is quite prolonged given only one small bite of food (suspect related to sustained attention), but she does not have any significant residue once she is done. Recommend adjusting diet to Dys 2 textures, continuing thin liquids. She will likely need full supervision during meals to initiate  and sustain attention to intake. Given limited amount of intake today and concern for dysphagia PTA, will f/u for tolerance. Suspect that pt's mentation will be her biggest barrier to adequate intake. SLP Visit Diagnosis: Dysphagia, unspecified (R13.10)    Aspiration Risk  Mild aspiration risk    Diet Recommendation Dysphagia 2 (Fine chop);Thin liquid   Liquid Administration via: Straw;Cup Medication Administration: Whole meds with puree Supervision: Patient able to self feed;Full supervision/cueing for compensatory strategies Compensations: Slow rate;Small sips/bites;Minimize environmental distractions Postural Changes: Seated upright at 90 degrees    Other  Recommendations Oral Care Recommendations: Oral care BID   Follow up Recommendations 24 hour supervision/assistance      Frequency and Duration min 2x/week  2 weeks       Prognosis Prognosis for Safe Diet Advancement: Fair Barriers to Reach Goals: Cognitive deficits      Swallow Study   General HPI: Pt is a 84 y.o. female with medical history significant of acid reflux, osteoarthritis, dementia, type 2 diabetes mellitus, hypertension, dyslipidemia, hypothyroidism, iron deficiency anemia, history of transitory ischemic attacks, chronic kidney disease stage III, and anemia of chronic disease. She is admitted with worsening dyspnea and cough, AMS. CT Head negative; CXR showed lower lung volumes. Type of Study: Bedside Swallow Evaluation Previous Swallow Assessment: none in chart Diet Prior to this Study: Regular;Thin liquids Temperature Spikes Noted: No Respiratory Status: Nasal cannula History of Recent Intubation: No Behavior/Cognition: Alert;Confused;Distractible;Requires cueing Oral Cavity Assessment: Within Functional Limits Oral Care Completed by SLP: No Vision: Functional for self-feeding Self-Feeding Abilities: Able to feed self;Needs assist  Patient Positioning: Upright in bed Baseline Vocal Quality: Normal     Oral/Motor/Sensory Function Overall Oral Motor/Sensory Function: (appears functional - limited assessment)   Ice Chips Ice chips: Not tested   Thin Liquid Thin Liquid: Within functional limits Presentation: Cup;Self Fed;Straw    Nectar Thick Nectar Thick Liquid: Not tested   Honey Thick Honey Thick Liquid: Not tested   Puree Puree: Not tested   Solid     Solid: Impaired Presentation: Self Fed Oral Phase Impairments: Impaired mastication      Germain Osgood 11/17/2017,10:14 AM   Germain Osgood, M.A. [place] Acute Environmental education officer [phone] Office [phone]

## 2017-11-17 NOTE — Progress Notes (Signed)
Pharmacy Antibiotic Note  Allison Page is a 84 y.o. female admitted on 11/16/2017 with pneumonia.  Pharmacy has been consulted for abx dosing.  She was admitted last night for CAP/bronchitis. Started on Levaquin because of uncertain PCN allergy. Discuss on round today and we will place her on ceftriaxone/azith x 6d  Plan: Dc levaquin Cetriaxone 1g IV q24 x 6 Azith 500mg  IV q24 x 6 Rx sign off  Height: 5\' 3"  (160 cm) Weight: 104 lb 11.5 oz (47.5 kg) IBW/kg (Calculated) : 52.4  Temp (24hrs), Avg:98.1 F (36.7 C), Min:97.5 F (36.4 C), Max:98.4 F (36.9 C)  Recent Labs  Lab 11/15/17 1333 11/16/17 1909 11/16/17 1952 11/17/17 0305  WBC 5.2 4.8  --  4.8  CREATININE 2.20* 2.28*  --  2.01*  LATICACIDVEN  --   --  0.99  --     Estimated Creatinine Clearance: 13.1 mL/min (A) (by C-G formula based on SCr of 2.01 mg/dL (H)).    Allergies  Allergen Reactions  . Allegra Allergy [Fexofenadine Hcl] Anaphylaxis  . Amoxicillin   . Penicillins   . Sulfa Antibiotics     Antimicrobials this admission: 10/17 doxy x1 10/17 levaquin x1  Ceftriaxone 10/17 x 6d Azith 10/17 x 6d  Dose adjustments this admission:   Microbiology results:  Onnie Boer, PharmD, BCIDP, AAHIVP, CPP Infectious Disease Pharmacist Pager: [phone] 11/17/2017 11:45 AM

## 2017-11-17 NOTE — Evaluation (Signed)
Physical Therapy Evaluation Patient Details Name: Allison Page MRN: [id number] DOB: 1922-11-19 Today's Date: 11/17/2017   History of Present Illness  Pt is a 84 y/o female admitted from ALF secondary to hypoxemia. Imaging revealed PNA vs. atelectasis. PMH includes dementia, HTN, TIA, DM, and anemia.   Clinical Impression  Pt admitted secondary to problem above with deficits below. Pt very limited secondary to back pain. Pt also with heavy posterior lean in standing and unable to correct with manual/verbal cues, so unable to ambulate away from EOB. Pt required mod to max A to stand and take side steps at EOB. Pt's daughter reports she would like pt to return to ALF, however, currently feel pt will require increased assist and may benefit from short term SNF at d/c. If ALF able to provide necessary assist needed, may be able to d/c back to ALF. Will continue to follow acutely to maximize functional mobility independence and safety.     Follow Up Recommendations SNF;Supervision/Assistance - 24 hour    Equipment Recommendations  None recommended by PT    Recommendations for Other Services       Precautions / Restrictions Precautions Precautions: Fall Restrictions Weight Bearing Restrictions: No      Mobility  Bed Mobility Overal bed mobility: Needs Assistance Bed Mobility: Supine to Sit;Sit to Supine     Supine to sit: Max assist Sit to supine: Max assist   General bed mobility comments: Max A for LE assist and trunk assist throughout bed mobility tasks. Pt requiring max encouragement to participate.   Transfers Overall transfer level: Needs assistance Equipment used: Rolling walker (2 wheeled) Transfers: Sit to/from Stand Sit to Stand: Mod assist;Max assist         General transfer comment: Mod to max A to stand as pt with increased back pain. Pt with heavy posterior lean and unable to correct with manual/verbal cues.   Ambulation/Gait Ambulation/Gait assistance:  Mod assist;Max assist   Assistive device: Rolling walker (2 wheeled)   Gait velocity: Decreased    General Gait Details: Performed side steps at EOB. Pt with posterior lean and unable to ambulate away from EOB. Mod-max A for steadying assist.   Stairs            Wheelchair Mobility    Modified Rankin (Stroke Patients Only)       Balance Overall balance assessment: Needs assistance Sitting-balance support: Bilateral upper extremity supported;Feet supported Sitting balance-Leahy Scale: Poor Sitting balance - Comments: Required min to min guard for sitting balance as pt would throw herself backwards secondary to increased pain.  Postural control: Posterior lean Standing balance support: Bilateral upper extremity supported;During functional activity Standing balance-Leahy Scale: Poor Standing balance comment: Pt with posterior lean in standing. Reliant on UE and external support.                              Pertinent Vitals/Pain Pain Assessment: Faces Faces Pain Scale: Hurts even more Pain Location: back Pain Descriptors / Indicators: Grimacing;Guarding Pain Intervention(s): Limited activity within patient's tolerance;Monitored during session;Repositioned    Home Living Family/patient expects to be discharged to:: Assisted living               Home Equipment: Walker - 2 wheels      Prior Function Level of Independence: Needs assistance   Gait / Transfers Assistance Needed: Pt's daughter reports staff assists pt with ambulation to bathroom with RW, however, spent most of  her time in her recliner.   ADL's / Homemaking Assistance Needed: Pt's daughter reports pt needed assist with ADL staff.         Hand Dominance        Extremity/Trunk Assessment   Upper Extremity Assessment Upper Extremity Assessment: Defer to OT evaluation    Lower Extremity Assessment Lower Extremity Assessment: Generalized weakness    Cervical / Trunk  Assessment Cervical / Trunk Assessment: Kyphotic  Communication   Communication: No difficulties  Cognition Arousal/Alertness: Awake/alert Behavior During Therapy: WFL for tasks assessed/performed Overall Cognitive Status: History of cognitive impairments - at baseline                                 General Comments: Dementia at baseline       General Comments General comments (skin integrity, edema, etc.): Pt's daughter reports they would like for pt to go back to ALF if possible.     Exercises     Assessment/Plan    PT Assessment Patient needs continued PT services  PT Problem List Decreased strength;Decreased activity tolerance;Decreased mobility;Decreased balance;Decreased cognition;Decreased knowledge of use of DME;Decreased safety awareness;Decreased knowledge of precautions;Pain       PT Treatment Interventions DME instruction;Gait training;Functional mobility training;Therapeutic activities;Therapeutic exercise;Balance training;Patient/family education;Cognitive remediation    PT Goals (Current goals can be found in the Care Plan section)  Acute Rehab PT Goals Patient Stated Goal: for pt to go back to ALF per daughter PT Goal Formulation: With family Time For Goal Achievement: 11/24/2017 Potential to Achieve Goals: Fair    Frequency Min 2X/week   Barriers to discharge        Co-evaluation               AM-PAC PT "6 Clicks" Daily Activity  Outcome Measure Difficulty turning over in bed (including adjusting bedclothes, sheets and blankets)?: A Lot Difficulty moving from lying on back to sitting on the side of the bed? : Unable Difficulty sitting down on and standing up from a chair with arms (e.g., wheelchair, bedside commode, etc,.)?: Unable Help needed moving to and from a bed to chair (including a wheelchair)?: A Lot Help needed walking in hospital room?: A Lot Help needed climbing 3-5 steps with a railing? : Total 6 Click Score: 9     End of Session Equipment Utilized During Treatment: Gait belt Activity Tolerance: Patient limited by pain Patient left: in bed;with call bell/phone within reach;with bed alarm set Nurse Communication: Mobility status PT Visit Diagnosis: Unsteadiness on feet (R26.81);Other abnormalities of gait and mobility (R26.89);Muscle weakness (generalized) (M62.81);History of falling (Z91.81);Pain Pain - part of body: (back )    Time: 2979-8921 PT Time Calculation (min) (ACUTE ONLY): 18 min   Charges:   PT Evaluation $PT Eval Moderate Complexity: 1 Mod          Leighton Ruff, PT, DPT  Acute Rehabilitation Services  Pager: [phone] Office: [phone]   Rudean Hitt 11/17/2017, 4:39 PM

## 2017-11-17 NOTE — Clinical Social Work Note (Signed)
Pt is from [facility]. CSW continuing to follow for a determination of pt's needed level of care at d/c.    [place], [place]

## 2017-11-17 NOTE — Progress Notes (Addendum)
PROGRESS NOTE    Kalany Diekmann [facility]  BSJ:[id number] DOB: 08-Sep-1922 DOA: 11/16/2017 PCP: Jacelyn Pi, NP  Brief Narrative: Allison Page is a 84 y.o. female with medical history significant of osteoarthritis, dementia, type 2 diabetes mellitus, hypertension, dyslipidemia, hypothyroidism, iron deficiency anemia, history of transitory ischemic attacks, chronic kidney disease stage III, and anemia of chronic disease.  About 7 days ago she was diagnosed with bronchitis, she had an inhaler/bronchodilator and a cough suppressive agent prescribed.  Her symptoms continued to deteriorate, consistent with productive cough, congestion, and wheezing.  On the day of admission she had severe dyspnea. -in ED she was hypoxic, chest x-ray showed pneumonia versus atelectasis   Assessment & Plan:   Acute hypoxic respiratory failure -Due to bronchitis versus early pneumonia -Repeat chest x-ray with bibasilar atelectasis -We will attempt to transition to ceftriaxone and azithromycin if she has tolerated cephalosporins in the past -Pulm toilet, nebs scheduled and as needed -Wean O2 -SLP evaluation  Chronic anemia -Due to chronic kidney disease and iron deficiency -transfused 1 unit PRBC overnight -Check anemia panel  Acute metabolic encephalopathy -In the setting of infection, chronic dementia -Caution with sedating meds benzos etc.  Chronic kidney disease stage III -Creatinine at baseline of 2  Hypothyroidism -Continue Synthroid  Hypertension -Continue metoprolol  Osteoarthritis/chronic pain -Continued home regimen of oxycodone  DVT prophylaxis: Lovenox Code Status: DNR Family Communication: No family at bedside Disposition Plan: Back to ALF versus SNF depending on clinical course  Consultants:      Procedures:   Antimicrobials:    Subjective: -confused, agitated overnight required single dose of Haldol  Objective: Vitals:   11/17/17 0613 11/17/17 0637 11/17/17 0736  11/17/17 0822  BP: (!) 156/54 (!) 156/50 (!) 168/62   Pulse: 74 67 72 70  Resp: 20 16  16   Temp: 98.1 F (36.7 C) 98.2 F (36.8 C) 98.4 F (36.9 C)   TempSrc: Oral Oral Oral   SpO2: 94% 97% 96% 97%  Weight:      Height:        Intake/Output Summary (Last 24 hours) at 11/17/2017 1014 Last data filed at 11/17/2017 0840 Gross per 24 hour  Intake 1625.24 ml  Output 600 ml  Net 1025.24 ml   Filed Weights   11/16/17 1855 11/16/17 2314  Weight: 51.2 kg 47.5 kg    Examination:  General exam: Appears calm and comfortable, restless, oriented x2 Respiratory system: Poor air movement, scattered rhonchi Cardiovascular system: S1 & S2 heard, RRR  Gastrointestinal system: Soft, slightly distended, nontender, bowel sounds present Central nervous system: Alert and oriented. No focal neurological deficits. Extremities: trace Edema Skin: No rashes, lesions or ulcers Psychiatry: Judgement and insight appear normal. Mood & affect appropriate.     Data Reviewed:   CBC: Recent Labs  Lab 11/15/17 1333 11/16/17 1909 11/17/17 0305  WBC 5.2 4.8 4.8  NEUTROABS 3.3  --   --   HGB 7.9* 7.4* 6.9*  HCT 26.1* 23.9* 21.9*  MCV 110.6* 110.6* 107.9*  PLT 375 352 765   Basic Metabolic Panel: Recent Labs  Lab 11/15/17 1333 11/16/17 1909 11/17/17 0305  NA 135 134* 136  K 3.9 3.5 3.2*  CL 110* 104 106  CO2 26 23 22   GLUCOSE 161* 130* 126*  BUN 39* 43* 34*  CREATININE 2.20* 2.28* 2.01*  CALCIUM 12.8* 12.8* 12.3*   GFR: Estimated Creatinine Clearance: 13.1 mL/min (A) (by C-G formula based on SCr of 2.01 mg/dL (H)). Liver Function Tests: Recent Labs  Lab 11/15/17 1333  AST 21  ALT 16  ALKPHOS 104*  BILITOT 0.4  PROT 7.1  ALBUMIN 3.3*   No results for input(s): LIPASE, AMYLASE in the last 168 hours. No results for input(s): AMMONIA in the last 168 hours. Coagulation Profile: No results for input(s): INR, PROTIME in the last 168 hours. Cardiac Enzymes: No results for  input(s): CKTOTAL, CKMB, CKMBINDEX, TROPONINI in the last 168 hours. BNP (last 3 results) No results for input(s): PROBNP in the last 8760 hours. HbA1C: No results for input(s): HGBA1C in the last 72 hours. CBG: Recent Labs  Lab 11/16/17 1928  GLUCAP 132*   Lipid Profile: No results for input(s): CHOL, HDL, LDLCALC, TRIG, CHOLHDL, LDLDIRECT in the last 72 hours. Thyroid Function Tests: No results for input(s): TSH, T4TOTAL, FREET4, T3FREE, THYROIDAB in the last 72 hours. Anemia Panel: No results for input(s): VITAMINB12, FOLATE, FERRITIN, TIBC, IRON, RETICCTPCT in the last 72 hours. Urine analysis:    Component Value Date/Time   COLORURINE YELLOW 11/16/2017 0000   APPEARANCEUR CLOUDY (A) 11/16/2017 0000   APPEARANCEUR Cloudy (A) 07/05/2014 1622   LABSPEC 1.010 11/16/2017 0000   PHURINE 6.0 11/16/2017 0000   GLUCOSEU NEGATIVE 11/16/2017 0000   GLUCOSEU NEGATIVE 09/28/2016 1500   HGBUR SMALL (A) 11/16/2017 0000   BILIRUBINUR NEGATIVE 11/16/2017 0000   BILIRUBINUR Negative 07/05/2014 1622   KETONESUR NEGATIVE 11/16/2017 0000   PROTEINUR 30 (A) 11/16/2017 0000   UROBILINOGEN 0.2 09/28/2016 1500   NITRITE NEGATIVE 11/16/2017 0000   LEUKOCYTESUR NEGATIVE 11/16/2017 0000   LEUKOCYTESUR Trace (A) 07/05/2014 1622   Sepsis Labs: @LABRCNTIP (procalcitonin:4,lacticidven:4)  ) Recent Results (from the past 240 hour(s))  Blood culture (routine x 2)     Status: None (Preliminary result)   Collection Time: 11/16/17  7:35 PM  Result Value Ref Range Status   Specimen Description BLOOD RIGHT ANTECUBITAL  Final   Special Requests   Final    BOTTLES DRAWN AEROBIC AND ANAEROBIC Blood Culture adequate volume   Culture   Final    NO GROWTH < 12 HOURS Performed at [facility], [place] [address] [place], [place] [address]    Report Status PENDING  Incomplete  Blood culture (routine x 2)     Status: None (Preliminary result)   Collection Time: 11/16/17  7:42 PM  Result Value Ref Range  Status   Specimen Description BLOOD RIGHT HAND  Final   Special Requests   Final    BOTTLES DRAWN AEROBIC AND ANAEROBIC Blood Culture results may not be optimal due to an inadequate volume of blood received in culture bottles   Culture   Final    NO GROWTH < 12 HOURS Performed at [facility], [place] [address], [place], [place] [address]    Report Status PENDING  Incomplete  Respiratory Panel by PCR     Status: None   Collection Time: 11/17/17  5:57 AM  Result Value Ref Range Status   Adenovirus NOT DETECTED NOT DETECTED Final   Coronavirus 229E NOT DETECTED NOT DETECTED Final   Coronavirus HKU1 NOT DETECTED NOT DETECTED Final   Coronavirus NL63 NOT DETECTED NOT DETECTED Final   Coronavirus OC43 NOT DETECTED NOT DETECTED Final   Metapneumovirus NOT DETECTED NOT DETECTED Final   Rhinovirus / Enterovirus NOT DETECTED NOT DETECTED Final   Influenza A NOT DETECTED NOT DETECTED Final   Influenza B NOT DETECTED NOT DETECTED Final   Parainfluenza Virus 1 NOT DETECTED NOT DETECTED Final   Parainfluenza Virus  2 NOT DETECTED NOT DETECTED Final   Parainfluenza Virus 3 NOT DETECTED NOT DETECTED Final   Parainfluenza Virus 4 NOT DETECTED NOT DETECTED Final   Respiratory Syncytial Virus NOT DETECTED NOT DETECTED Final   Bordetella pertussis NOT DETECTED NOT DETECTED Final   Chlamydophila pneumoniae NOT DETECTED NOT DETECTED Final   Mycoplasma pneumoniae NOT DETECTED NOT DETECTED Final         Radiology Studies: Dg Chest 1 View  Result Date: 11/17/2017 CLINICAL DATA:  Shortness of breath.  Cough.  Congestion. EXAM: CHEST  1 VIEW COMPARISON:  11/16/2017. FINDINGS: Stable mild cardiomegaly with normal pulmonary vascular mild bibasilar atelectasis/scarring. No pleural effusion or pneumothorax. Degenerative change thoracic spine with thoracic spine scoliosis. Stable lower thoracic vertebral body compression fractures. Degenerative changes thoracic spine. Degenerative changes both shoulders.  No acute bony abnormality identified. Surgical clips left chest. IMPRESSION: 1.  Stable mild cardiomegaly. 2.  Mild bibasilar atelectasis and/or scarring. Electronically Signed   By: Marcello Moores  Register   On: 11/17/2017 09:42   Dg Chest 2 View  Result Date: 11/16/2017 CLINICAL DATA:  84 year old female with generalized weakness and dysphagia. EXAM: CHEST - 2 VIEW COMPARISON:  Chest radiographs 09/18/2017 and earlier. FINDINGS: Calcified aortic atherosclerosis. Stable cardiac size at the upper limits of normal to mildly increased. Mildly lower lung volumes. No pneumothorax, pulmonary edema, pleural effusion or confluent pulmonary opacity. Osteopenia. No acute osseous abnormality identified. Negative visible bowel gas pattern. IMPRESSION: 1. Lower lung volumes, otherwise no acute cardiopulmonary abnormality. 2.  Aortic Atherosclerosis (ICD10-I70.0). Electronically Signed   By: Genevie Ann M.D.   On: 11/16/2017 21:20   Ct Head Wo Contrast  Result Date: 11/16/2017 CLINICAL DATA:  84 year old female with weakness and dysphagia. EXAM: CT HEAD WITHOUT CONTRAST TECHNIQUE: Contiguous axial images were obtained from the base of the skull through the vertex without intravenous contrast. COMPARISON:  09/18/2017 head CT and earlier. FINDINGS: Brain: Stable cerebral volume. Stable gray-white matter differentiation throughout the brain. No midline shift, ventriculomegaly, mass effect, evidence of mass lesion, intracranial hemorrhage or evidence of cortically based acute infarction. No cortical encephalomalacia identified. Vascular: Calcified atherosclerosis at the skull base. Skull: No suspicious intracranial vascular hyperdensity. Sinuses/Orbits: Visualized paranasal sinuses and mastoids are stable and well pneumatized. Other: Stable orbit and scalp soft tissues. IMPRESSION: No acute findings.  Stable non contrast CT appearance of the brain. Electronically Signed   By: Genevie Ann M.D.   On: 11/16/2017 21:09         Scheduled Meds: . sodium chloride   Intravenous Once  . acetaminophen  650 mg Oral BID  . aspirin EC  81 mg Oral Daily  . atorvastatin  20 mg Oral QHS  . cholecalciferol  2,000 Units Oral Daily  . darifenacin  7.5 mg Oral Daily  . donepezil  10 mg Oral Daily  . enoxaparin (LOVENOX) injection  30 mg Subcutaneous Daily  . ipratropium-albuterol  3 mL Nebulization TID  . levothyroxine  100 mcg Oral QAC breakfast  . LORazepam  0.25 mg Oral Daily  . magnesium oxide  400 mg Oral Daily  . mouth rinse  15 mL Mouth Rinse BID  . metoprolol succinate  25 mg Oral Daily  . pantoprazole  40 mg Oral BID AC  . polyethylene glycol  17 g Oral Daily  . potassium chloride  40 mEq Oral Once  . triamcinolone  2 spray Nasal Daily  . vitamin B-12  1,000 mcg Oral Daily   Continuous Infusions: . [START ON 11/18/2017] levofloxacin (  LEVAQUIN) IV       LOS: 1 day    Time spent: 27min    Domenic Polite, MD [facility] Page via [email or web address], password [email or web address] After 7PM please contact night-coverage  11/17/2017, 10:14 AM

## 2017-11-18 ENCOUNTER — Encounter: Payer: Self-pay | Admitting: Internal Medicine

## 2017-11-18 ENCOUNTER — Encounter (HOSPITAL_COMMUNITY): Payer: Self-pay

## 2017-11-18 LAB — CBC
HCT: 32.3 % — ABNORMAL LOW (ref 36.0–46.0)
Hemoglobin: 10.4 g/dL — ABNORMAL LOW (ref 12.0–15.0)
MCH: 31 pg (ref 26.0–34.0)
MCHC: 32.2 g/dL (ref 30.0–36.0)
MCV: 96.1 fL (ref 80.0–100.0)
Platelets: 479 10*3/uL — ABNORMAL HIGH (ref 150–400)
RBC: 3.36 MIL/uL — ABNORMAL LOW (ref 3.87–5.11)
RDW: 21.6 % — AB (ref 11.5–15.5)
WBC: 5 10*3/uL (ref 4.0–10.5)
nRBC: 0.8 % — ABNORMAL HIGH (ref 0.0–0.2)

## 2017-11-18 LAB — BASIC METABOLIC PANEL
Anion gap: 11 (ref 5–15)
BUN: 25 mg/dL — ABNORMAL HIGH (ref 8–23)
CALCIUM: 12.9 mg/dL — AB (ref 8.9–10.3)
CO2: 22 mmol/L (ref 22–32)
CREATININE: 1.71 mg/dL — AB (ref 0.44–1.00)
Chloride: 104 mmol/L (ref 98–111)
GFR calc Af Amer: 29 mL/min — ABNORMAL LOW (ref >60)
GFR, EST NON AFRICAN AMERICAN: 25 mL/min — AB (ref >60)
Glucose, Bld: 154 mg/dL — ABNORMAL HIGH (ref 70–99)
Potassium: 3.3 mmol/L — ABNORMAL LOW (ref 3.5–5.1)
SODIUM: 137 mmol/L (ref 135–145)

## 2017-11-18 LAB — TYPE AND SCREEN
ABO/RH(D): A POS
Antibody Screen: NEGATIVE
Unit division: 0

## 2017-11-18 LAB — BPAM RBC
Blood Product Expiration Date: 201911142359
ISSUE DATE / TIME: 201910170616
Unit Type and Rh: 6200

## 2017-11-18 MED ORDER — FUROSEMIDE 10 MG/ML IJ SOLN
20.0000 mg | Freq: Once | INTRAMUSCULAR | Status: AC
  Administered 2017-11-18: 20 mg via INTRAVENOUS
  Filled 2017-11-18: qty 2

## 2017-11-18 MED ORDER — BENZONATATE 100 MG PO CAPS
100.0000 mg | ORAL_CAPSULE | Freq: Two times a day (BID) | ORAL | Status: DC
  Administered 2017-11-18 – 2017-11-23 (×10): 100 mg via ORAL
  Filled 2017-11-18 (×10): qty 1

## 2017-11-18 MED ORDER — POTASSIUM CHLORIDE CRYS ER 20 MEQ PO TBCR
40.0000 meq | EXTENDED_RELEASE_TABLET | Freq: Two times a day (BID) | ORAL | Status: AC
  Administered 2017-11-18 (×2): 40 meq via ORAL
  Filled 2017-11-18 (×2): qty 2

## 2017-11-18 NOTE — Clinical Social Work Note (Signed)
Clinical Social Work Assessment  Patient Details  Name: Allison Page MRN: [id number] Date of Birth: 04-19-1922  Date of referral:  11/18/17               Reason for consult:  Facility Placement                Permission sought to share information with:  Facility Sport and exercise psychologist, Family Supports Permission granted to share information::  Yes, Release of Information Signed  Name::     [name]  Agency::  [facility]  Relationship::  Daughter  Sport and exercise psychologist Information:     Housing/Transportation Living arrangements for the past 2 months:  [place] of Information:  Adult Children Patient Interpreter Needed:  None Criminal Activity/Legal Involvement Pertinent to Current Situation/Hospitalization:  No - Comment as needed Significant Relationships:  Adult Children Lives with:  Self Do you feel safe going back to the place where you live?  Yes Need for family participation in patient care:  No (Coment)  Care giving concerns:  Pt is only alert to self. CSW spoke with pt's daughter via telephone.  Social Worker assessment / plan:  CSW spoke with pt's daughter. Per pt's daughter pt resides at [facility]. Pt's daughter has hired a Dentist with the pt until 10:00 PM. After 10:00PM the pt utilizes the call bell if needed--per pt's daughter the facility does a great job at checking in on pt frequently. CSW explained to pt's daughter PT's recommendation for SNF at d/c. Pt's daughter does not see how SNF will be helpful--given that pt does not participate in much activity and pt is 84 years old. Per pt's daughter pt is a max assist at baseline. Pt's daughter states pt's pain level prevents pt from doing a lot of activity. Per Pt's daughter pt gets PT at the facility and they are well know of pt's pain tolerance and does a great job with pt. Pt's daughter is requesting that pt return to ALF at d/c. CSW will reach out to ALF to determine if they are agreeable to  take pt back per PT recommendation for SNF.   CSW spoke with pt's med teach at ALF and she will pass this on to the director and the director will call CSW back.  Employment status:  Retired Forensic scientist:  Medicare PT Recommendations:  [place] / Referral to community resources:  [place]  Patient/Family's Response to care:  Pt's daughter verbalized understanding of CSW role and expressed appreciation for support. Pt's daughter is concerned regarding d/c plan for Pt.   Patient/Family's Understanding of and Emotional Response to Diagnosis, Current Treatment, and Prognosis:  Pt's daughter understanding and realistic regarding physical limitations--however pt's daughter states this is baseline for pt. Pt's daughter states ALF is accustom to pt and pt needing assistance. Pt's daughter does not understands the need for SNF placement at d/c and does not believe it would be beneficial but believes it would cause pt immense amount of pain. Pt's daughter did not agree to SNF placement at d/c, at this time. Pt's daughter prefers for pt to return to ALF. Pt denies any other concern regarding treatment plan at this time. CSW will continue to provide support and facilitate d/c needs.   Emotional Assessment Appearance:  Appears stated age Attitude/Demeanor/Rapport:  Unable to Assess Affect (typically observed):  Unable to Assess Orientation:  Oriented to Self Alcohol / Substance use:  Not Applicable Psych involvement (Current and /or in  the community):  No (Comment)  Discharge Needs  Concerns to be addressed:  Care Coordination, Basic Needs Readmission within the last 30 days:  No Current discharge risk:  Dependent with Mobility Barriers to Discharge:  Continued Medical Work up   [employer], LCSW 11/18/2017, 9:28 AM

## 2017-11-18 NOTE — Care Management Note (Signed)
Case Management Note  Patient Details  Name: Allison Page MRN: [id number] Date of Birth: 1922-07-23  Subjective/Objective:   From ALF [facility], daughter states she wants patient to be dc tomorrow back to ALF, she has care givers around the clock 24/7.  She would like MD to call her at [facility] because she will have to give sitters advance notice of when to be there with patient.  NCM left message for MD to call daughter.                  Action/Plan: NCM will follow for transitional care needs.   Expected Discharge Date:                  Expected Discharge Plan:  Assisted Living / Rest Home  In-House Referral:  Clinical Social Work  Discharge planning Services  CM Consult  Post Acute Care Choice:    Choice offered to:     DME Arranged:    DME Agency:     HH Arranged:    HH Agency:     Status of Service:  In process, will continue to follow  If discussed at Long Length of Stay Meetings, dates discussed:    Additional Comments:  Zenon Mayo, RN 11/18/2017, 4:24 PM

## 2017-11-18 NOTE — Consult Note (Signed)
            [facility] CM Primary Care Navigator  11/18/2017  [name] [facility] 1923-01-05 [id number]   Went to seepatient at the bedside but she was fast asleep. Was able to meet with daughter Allison Page) in the room to identify possible discharge needs. Daughter reportsthat patient "had trouble breathing, coughing and weakness in the facility" that had led to thisadmission. (acute hypoxic respiratory failure due to bronchitis versus early pneumonia, anemia, acute metabolic encephalopathy in the setting of infection, chronic dementia, osteoarthritis/ chronic pain)  [facility].  Patient's daughter mentioned switching primary care provider from Dr. Cathlean Cower with [facility] to the facility physician at [facility]- Dr. McDonald/ Thu Benedict Needy, NP as the current primary careprovider.   Patient's daughter states that facility staff dispense and administer patient's medications.  Daughter and care sitter had been providingtransportation to patient's doctors'appointments (specialty).  Per daughter, facility staff and care sitter (that she hired from [facility]- who comes in 19 hours per day) have been providing assistance with patient's care needs 24/7.   According to MD note, anticipated discharge plan is back to ALF- assisted living facility vs. SNF-skilled nursing facility depending on clinical course.   Daughter hopes that patient will return back to [facility]. As per Inpatient social worker note, will reach out to [facility] to determine if they are agreeable to take patient back inspite PT recommendation for SNF- skilled nursing facility.   Per daughter, primary care provider will be following patient in the facility upon discharge.  Daughter has indicated having no current needs or concerns at this point.  [facility] care management information  provided for future needs that patient may have.   For additional questions please contact:  Edwena Felty A. Jadea Shiffer, BSN, RN-BC [facility] PRIMARY CARE Navigator Cell: [phone]

## 2017-11-18 NOTE — Evaluation (Signed)
Occupational Therapy Evaluation Patient Details Name: Allison Page MRN: [id number] DOB: [birth year] Today's Date: 11/18/2017    History of Present Illness Pt is a 84 y/o female admitted from ALF secondary to hypoxemia. Imaging revealed PNA vs. atelectasis. PMH includes dementia, HTN, TIA, DM, and anemia.    Clinical Impression   Pt admitted secondary to problem above and presents to OT with deficits impacting her participation in ADLs.  Pt's cognition and pain limiting participation in evaluation.  Per report, pt with increased agitation over night.  Presenting more impaired this session than during PT evaluation.  Pt required max to total +2 for bed mobility and to maintain sitting balance at EOB due to severe posterior lean.  Pt confused, stating she was in [place], and requiring increased time to complete one step commands.  Unable to complete any standing this session as pt extending at trunk and therefore unable to safely transfer OOB.    Acknowledge LCSW note that family requesting return to ALF vs SNF, however given pt current presentation this therapist would strongly recommend SNF at this time.  Due to dementia and increased confusion at this time, feel pt would be unsafe when unsupervised after 10PM.    Follow Up Recommendations  SNF;Supervision/Assistance - 24 hour    Equipment Recommendations  None recommended by OT       Precautions / Restrictions Precautions Precautions: Fall Restrictions Weight Bearing Restrictions: No      Mobility Bed Mobility Overal bed mobility: Needs Assistance Bed Mobility: Supine to Sit;Sit to Supine     Supine to sit: +2 for physical assistance;Max assist;Total assist Sit to supine: Max assist;Total assist;+2 for physical assistance   General bed mobility comments: Max to Total A of 2 to bring legs over EOB and support trunk. no control, strong posterior lean noted.   Transfers                 General transfer comment:  attempted but defered for patient safety. Pt likely more confused this visit than prior PT evaluation and unable to follow basic cues for standing/transfer. fully extened back and legs upon EOB and required return to bed, grimacing in pain     Balance Overall balance assessment: Needs assistance Sitting-balance support: Bilateral upper extremity supported;Feet supported Sitting balance-Leahy Scale: Poor Sitting balance - Comments: required max to total assist for sitting balance due to increased posterior lean Postural control: Posterior lean                                 ADL either performed or assessed with clinical judgement   ADL Overall ADL's : Needs assistance/impaired     Grooming: Sitting;Total assistance   Upper Body Bathing: Total assistance;Bed level   Lower Body Bathing: Bed level;Total assistance;+2 for physical assistance Lower Body Bathing Details (indicate cue type and reason): Pt required max assist and increased time for bed mobility, requiring up to +2 for rolling as she fatigued and pain in back increased.  Unable to safely come to standing with +2 assistance this session                     Functional mobility during ADLs: +2 for physical assistance;Cueing for sequencing General ADL Comments: Pt required max assist and increased time for bed mobility, requiring up to +2 for rolling as she fatigued and pain in back increased.  Severe posterior lean in sitting at EOB  with max-total assist to maintain sitting EOB.  Unable to safely come to standing with +2 assistance this session     Vision Baseline Vision/History: Wears glasses Wears Glasses: At all times Patient Visual Report: No change from baseline Vision Assessment?: Vision impaired- to be further tested in functional context Additional Comments: Pt demonstrating difficulty maintaining eyes open throughout session, due to decreased cognition and inability to maintain eyes open unable to  formally assess            Pertinent Vitals/Pain Pain Assessment: Faces Faces Pain Scale: Hurts even more Pain Location: back Pain Descriptors / Indicators: Grimacing;Guarding Pain Intervention(s): Limited activity within patient's tolerance;Monitored during session        Extremity/Trunk Assessment Upper Extremity Assessment Upper Extremity Assessment: Generalized weakness;Difficult to assess due to impaired cognition(Decreased Rt shoulder ROM due to arthritis)       Cervical / Trunk Assessment Cervical / Trunk Assessment: Kyphotic   Communication Communication Communication: No difficulties   Cognition Arousal/Alertness: Awake/alert Behavior During Therapy: WFL for tasks assessed/performed Overall Cognitive Status: History of cognitive impairments - at baseline                                 General Comments: Dementia at baseline, following commands 50% of time with delayed processing. Pt reports that we are in [place] expects to be discharged to:: Assisted living                             Home Equipment: Gilford Rile - 2 wheels          Prior Functioning/Environment Level of Independence: Needs assistance  Gait / Transfers Assistance Needed: Per report from PT as no family present, pt's daughter reports staff assists pt with ambulation to bathroom with RW, however, spent most of her time in her recliner.  ADL's / Homemaking Assistance Needed: Per report from PT as no family present, pt's daughter reports pt needed assist with ADL staff.             OT Problem List: Decreased strength;Decreased range of motion;Decreased activity tolerance;Impaired balance (sitting and/or standing);Decreased cognition;Decreased safety awareness;Decreased knowledge of use of DME or AE;Pain;Impaired UE functional use      OT Treatment/Interventions: Self-care/ADL training;Therapeutic activities;Balance  training;Patient/family education    OT Goals(Current goals can be found in the care plan section) Acute Rehab OT Goals Patient Stated Goal: non stated Time For Goal Achievement: 12/02/17 Potential to Achieve Goals: Fair  OT Frequency: Min 2X/week   Barriers to D/C: Decreased caregiver support  has a sitter until 10PM, concerned about safety overnight due to impaired cognition       Co-evaluation PT/OT/SLP Co-Evaluation/Treatment: Yes Reason for Co-Treatment: Complexity of the patient's impairments (multi-system involvement);For patient/therapist safety PT goals addressed during session: Mobility/safety with mobility;Balance;Proper use of DME OT goals addressed during session: ADL's and self-care;Proper use of Adaptive equipment and DME      AM-PAC PT "6 Clicks" Daily Activity     Outcome Measure Help from another person eating meals?: Total Help from another person taking care of personal grooming?: Total Help from another person toileting, which includes using toliet, bedpan, or urinal?: Total Help from another person bathing (including washing, rinsing, drying)?: Total Help from another person to put on and taking off regular upper  body clothing?: Total Help from another person to put on and taking off regular lower body clothing?: Total 6 Click Score: 6   End of Session Nurse Communication: Mobility status  Activity Tolerance: Patient limited by pain;Patient limited by fatigue Patient left: in bed;with bed alarm set;with call bell/phone within reach  OT Visit Diagnosis: Unsteadiness on feet (R26.81);Pain;Other symptoms and signs involving cognitive function Pain - part of body: (back)                Time: 5825-1898 OT Time Calculation (min): 26 min Charges:  OT General Charges $OT Visit: 1 Visit OT Evaluation $OT Eval Moderate Complexity: [place], [place], [place] 11/18/2017, 11:57 AM

## 2017-11-18 NOTE — Clinical Social Work Note (Addendum)
CSW spoke with ALF and they have agreed to talk pt back at d/c. ALF can not do IV meds or injections. Meds would need to be PO. CSW continuing to follow.    3:58: Pt's daughter requesting to speak to CSW at bedside. Pt's daughter states she has spoke with ALF as well and she will be taking pt back to ALF tomorrow. Pt's daughter states pt is medically stable and there is no need for pt to stay here. Pt's daughter states pt is at her baseline and any more PT will not help pt. CSW will let MD know.    [place], [place]

## 2017-11-18 NOTE — Progress Notes (Signed)
PROGRESS NOTE    Allison Page [facility]  ZJQ:[id number] DOB: 26-Jul-1922 DOA: 11/16/2017 PCP: Jacelyn Pi, NP  Brief Narrative: Allison Page is a 84 y.o. female with medical history significant of osteoarthritis, dementia, type 2 diabetes mellitus, hypertension, dyslipidemia, hypothyroidism, iron deficiency anemia, history of transitory ischemic attacks, chronic kidney disease stage III, and anemia of chronic disease.  About 7 days ago she was diagnosed with bronchitis, she had an inhaler/bronchodilator and a cough suppressive agent prescribed.  Her symptoms continued to deteriorate, consistent with productive cough, congestion, and wheezing.  On the day of admission she had severe dyspnea. -in ED she was hypoxic, chest x-ray showed pneumonia versus atelectasis   Assessment & Plan:   Acute hypoxic respiratory failure -Due to bronchitis versus early pneumonia -Repeat chest x-ray with bibasilar atelectasis -transitioned to ceftriaxone and azithromycin  -improving -Pulm toilet, nebs scheduled and as needed -Mucinex, add Tessalon Perles -Weaned off O2 -SLP evaluation: mild Aspiration risk suspected, started on dysphagia 2 diet with thin liquids  Chronic anemia -Due to chronic kidney disease and iron deficiency -transfused 1 unit PRBC overnight on admission -Anemia panel suggestive of chronic disease  Acute metabolic encephalopathy -In the setting of infection, chronic dementia -Caution with sedating meds benzos etc.  Chronic kidney disease stage III -Creatinine at baseline of 2  Hypothyroidism -Continue Synthroid  Hypertension -Continue metoprolol  Osteoarthritis/chronic pain -Continue home regimen of oxycodone  DVT prophylaxis: Lovenox Code Status: DNR Family Communication: No family at bedside Disposition Plan: Back to ALF versus SNF depending on clinical course  Consultants:      Procedures:   Antimicrobials:    Subjective: -Continues to have dry cough,  breathing a little better than yesterday  Objective: Vitals:   11/18/17 0044 11/18/17 0608 11/18/17 0815 11/18/17 0850  BP: (!) 162/61   (!) 176/94  Pulse: (!) 156 90 90 (!) 111  Resp: (!) 24  (!) 21   Temp: 98.8 F (37.1 C)   98.1 F (36.7 C)  TempSrc:    Oral  SpO2: 99% 94% 96% 93%  Weight:      Height:        Intake/Output Summary (Last 24 hours) at 11/18/2017 1346 Last data filed at 11/18/2017 0600 Gross per 24 hour  Intake 350 ml  Output 1200 ml  Net -850 ml   Filed Weights   11/16/17 1855 11/16/17 2314  Weight: 51.2 kg 47.5 kg    Examination:  Gen: Elderly, frail, cachectic female laying in bed, no distress today, oriented to self and partly to place only HEENT: PERRLA, Neck supple, no JVD Lungs: Scattered rhonchi  CVS: RRR,No Gallops,Rubs or new Murmurs Abd: soft, Non tender, non distended, BS present Extremities: trace edema Psychiatry: Judgement and insight appear normal. Mood & affect appropriate.     Data Reviewed:   CBC: Recent Labs  Lab 11/15/17 1333 11/16/17 1909 11/17/17 0305 11/17/17 1522 11/18/17 0628  WBC 5.2 4.8 4.8 5.6 5.0  NEUTROABS 3.3  --   --   --   --   HGB 7.9* 7.4* 6.9* 10.2* 10.4*  HCT 26.1* 23.9* 21.9* 32.8* 32.3*  MCV 110.6* 110.6* 107.9* 97.9 96.1  PLT 375 352 344 479* 240*   Basic Metabolic Panel: Recent Labs  Lab 11/15/17 1333 11/16/17 1909 11/17/17 0305 11/18/17 0628  NA 135 134* 136 137  K 3.9 3.5 3.2* 3.3*  CL 110* 104 106 104  CO2 26 23 22 22   GLUCOSE 161* 130* 126* 154*  BUN 39*  43* 34* 25*  CREATININE 2.20* 2.28* 2.01* 1.71*  CALCIUM 12.8* 12.8* 12.3* 12.9*   GFR: Estimated Creatinine Clearance: 15.4 mL/min (A) (by C-G formula based on SCr of 1.71 mg/dL (H)). Liver Function Tests: Recent Labs  Lab 11/15/17 1333  AST 21  ALT 16  ALKPHOS 104*  BILITOT 0.4  PROT 7.1  ALBUMIN 3.3*   No results for input(s): LIPASE, AMYLASE in the last 168 hours. No results for input(s): AMMONIA in the last 168  hours. Coagulation Profile: No results for input(s): INR, PROTIME in the last 168 hours. Cardiac Enzymes: No results for input(s): CKTOTAL, CKMB, CKMBINDEX, TROPONINI in the last 168 hours. BNP (last 3 results) No results for input(s): PROBNP in the last 8760 hours. HbA1C: No results for input(s): HGBA1C in the last 72 hours. CBG: Recent Labs  Lab 11/16/17 1928  GLUCAP 132*   Lipid Profile: No results for input(s): CHOL, HDL, LDLCALC, TRIG, CHOLHDL, LDLDIRECT in the last 72 hours. Thyroid Function Tests: No results for input(s): TSH, T4TOTAL, FREET4, T3FREE, THYROIDAB in the last 72 hours. Anemia Panel: Recent Labs    11/17/17 1051  VITAMINB12 3,660*  FOLATE 10.5  FERRITIN 117  TIBC 252  IRON 76  RETICCTPCT 1.7   Urine analysis:    Component Value Date/Time   COLORURINE YELLOW 11/16/2017 0000   APPEARANCEUR CLOUDY (A) 11/16/2017 0000   APPEARANCEUR Cloudy (A) 07/05/2014 1622   LABSPEC 1.010 11/16/2017 0000   PHURINE 6.0 11/16/2017 0000   GLUCOSEU NEGATIVE 11/16/2017 0000   GLUCOSEU NEGATIVE 09/28/2016 1500   HGBUR SMALL (A) 11/16/2017 0000   BILIRUBINUR NEGATIVE 11/16/2017 0000   BILIRUBINUR Negative 07/05/2014 1622   KETONESUR NEGATIVE 11/16/2017 0000   PROTEINUR 30 (A) 11/16/2017 0000   UROBILINOGEN 0.2 09/28/2016 1500   NITRITE NEGATIVE 11/16/2017 0000   LEUKOCYTESUR NEGATIVE 11/16/2017 0000   LEUKOCYTESUR Trace (A) 07/05/2014 1622   Sepsis Labs: @LABRCNTIP (procalcitonin:4,lacticidven:4)  ) Recent Results (from the past 240 hour(s))  Blood culture (routine x 2)     Status: None (Preliminary result)   Collection Time: 11/16/17  7:35 PM  Result Value Ref Range Status   Specimen Description BLOOD RIGHT ANTECUBITAL  Final   Special Requests   Final    BOTTLES DRAWN AEROBIC AND ANAEROBIC Blood Culture adequate volume   Culture   Final    NO GROWTH 2 DAYS Performed at [facility], [place] [address] [place], [place] [address]    Report Status PENDING   Incomplete  Blood culture (routine x 2)     Status: None (Preliminary result)   Collection Time: 11/16/17  7:42 PM  Result Value Ref Range Status   Specimen Description BLOOD RIGHT HAND  Final   Special Requests   Final    BOTTLES DRAWN AEROBIC AND ANAEROBIC Blood Culture results may not be optimal due to an inadequate volume of blood received in culture bottles   Culture   Final    NO GROWTH 2 DAYS Performed at [facility], [place] [address] [place], [place] [address]    Report Status PENDING  Incomplete  Respiratory Panel by PCR     Status: None   Collection Time: 11/17/17  5:57 AM  Result Value Ref Range Status   Adenovirus NOT DETECTED NOT DETECTED Final   Coronavirus 229E NOT DETECTED NOT DETECTED Final   Coronavirus HKU1 NOT DETECTED NOT DETECTED Final   Coronavirus NL63 NOT DETECTED NOT DETECTED Final   Coronavirus OC43 NOT DETECTED NOT DETECTED Final  Metapneumovirus NOT DETECTED NOT DETECTED Final   Rhinovirus / Enterovirus NOT DETECTED NOT DETECTED Final   Influenza A NOT DETECTED NOT DETECTED Final   Influenza B NOT DETECTED NOT DETECTED Final   Parainfluenza Virus 1 NOT DETECTED NOT DETECTED Final   Parainfluenza Virus 2 NOT DETECTED NOT DETECTED Final   Parainfluenza Virus 3 NOT DETECTED NOT DETECTED Final   Parainfluenza Virus 4 NOT DETECTED NOT DETECTED Final   Respiratory Syncytial Virus NOT DETECTED NOT DETECTED Final   Bordetella pertussis NOT DETECTED NOT DETECTED Final   Chlamydophila pneumoniae NOT DETECTED NOT DETECTED Final   Mycoplasma pneumoniae NOT DETECTED NOT DETECTED Final         Radiology Studies: Dg Chest 1 View  Result Date: 11/17/2017 CLINICAL DATA:  Shortness of breath.  Cough.  Congestion. EXAM: CHEST  1 VIEW COMPARISON:  11/16/2017. FINDINGS: Stable mild cardiomegaly with normal pulmonary vascular mild bibasilar atelectasis/scarring. No pleural effusion or pneumothorax. Degenerative change thoracic spine with thoracic spine  scoliosis. Stable lower thoracic vertebral body compression fractures. Degenerative changes thoracic spine. Degenerative changes both shoulders. No acute bony abnormality identified. Surgical clips left chest. IMPRESSION: 1.  Stable mild cardiomegaly. 2.  Mild bibasilar atelectasis and/or scarring. Electronically Signed   By: Marcello Moores  Register   On: 11/17/2017 09:42   Dg Chest 2 View  Result Date: 11/16/2017 CLINICAL DATA:  84 year old female with generalized weakness and dysphagia. EXAM: CHEST - 2 VIEW COMPARISON:  Chest radiographs 09/18/2017 and earlier. FINDINGS: Calcified aortic atherosclerosis. Stable cardiac size at the upper limits of normal to mildly increased. Mildly lower lung volumes. No pneumothorax, pulmonary edema, pleural effusion or confluent pulmonary opacity. Osteopenia. No acute osseous abnormality identified. Negative visible bowel gas pattern. IMPRESSION: 1. Lower lung volumes, otherwise no acute cardiopulmonary abnormality. 2.  Aortic Atherosclerosis (ICD10-I70.0). Electronically Signed   By: Genevie Ann M.D.   On: 11/16/2017 21:20   Ct Head Wo Contrast  Result Date: 11/16/2017 CLINICAL DATA:  84 year old female with weakness and dysphagia. EXAM: CT HEAD WITHOUT CONTRAST TECHNIQUE: Contiguous axial images were obtained from the base of the skull through the vertex without intravenous contrast. COMPARISON:  09/18/2017 head CT and earlier. FINDINGS: Brain: Stable cerebral volume. Stable gray-white matter differentiation throughout the brain. No midline shift, ventriculomegaly, mass effect, evidence of mass lesion, intracranial hemorrhage or evidence of cortically based acute infarction. No cortical encephalomalacia identified. Vascular: Calcified atherosclerosis at the skull base. Skull: No suspicious intracranial vascular hyperdensity. Sinuses/Orbits: Visualized paranasal sinuses and mastoids are stable and well pneumatized. Other: Stable orbit and scalp soft tissues. IMPRESSION: No acute  findings.  Stable non contrast CT appearance of the brain. Electronically Signed   By: Genevie Ann M.D.   On: 11/16/2017 21:09        Scheduled Meds: . sodium chloride   Intravenous Once  . acetaminophen  650 mg Oral BID  . aspirin EC  81 mg Oral Daily  . atorvastatin  20 mg Oral QHS  . benzonatate  100 mg Oral BID  . cholecalciferol  2,000 Units Oral Daily  . darifenacin  7.5 mg Oral Daily  . donepezil  10 mg Oral Daily  . enoxaparin (LOVENOX) injection  30 mg Subcutaneous Daily  . furosemide  20 mg Intravenous Once  . ipratropium-albuterol  3 mL Nebulization TID  . levothyroxine  100 mcg Oral QAC breakfast  . LORazepam  0.25 mg Oral Daily  . magnesium oxide  400 mg Oral Daily  . mouth rinse  15 mL Mouth  Rinse BID  . metoprolol succinate  25 mg Oral Daily  . pantoprazole  40 mg Oral BID AC  . polyethylene glycol  17 g Oral Daily  . potassium chloride  40 mEq Oral BID  . triamcinolone  2 spray Nasal Daily  . vitamin B-12  1,000 mcg Oral Daily   Continuous Infusions: . azithromycin 500 mg (11/17/17 2100)  . cefTRIAXone (ROCEPHIN)  IV 1 g (11/17/17 2002)     LOS: 2 days    Time spent: 69min    Domenic Polite, MD [facility] Page via [email or web address], password [email or web address] After 7PM please contact night-coverage  11/18/2017, 1:46 PM

## 2017-11-18 NOTE — Progress Notes (Signed)
Physical Therapy Treatment Patient Details Name: Allison Page MRN: [id number] DOB: 1922-03-27 Today's Date: 11/18/2017    History of Present Illness Pt is a 84 y/o female admitted from ALF secondary to hypoxemia. Imaging revealed PNA vs. atelectasis. PMH includes dementia, HTN, TIA, DM, and anemia.     PT Comments    Pt unable to progress with therapy today, limited by cognition and pain. Acknowledge LCSW note that family requesting return to ALF vs SNF. Given patients current presentation, I strongly feel this would be highly unsafe. Daughter states after 10pm patient unsupervised  is to use call bell when needing assist, but due to dementia and confusion my and evaluating PT opinion she would be unable to reliably use call bell and be at high risk of falling. Max to total A x2 for any mobility an unable  to safely transfer to standing or chair today.     Follow Up Recommendations  SNF;Supervision/Assistance - 24 hour     Equipment Recommendations  None recommended by PT    Recommendations for Other Services       Precautions / Restrictions Precautions Precautions: Fall Restrictions Weight Bearing Restrictions: No    Mobility  Bed Mobility Overal bed mobility: Needs Assistance Bed Mobility: Supine to Sit;Sit to Supine     Supine to sit: +2 for physical assistance;Max assist;Total assist     General bed mobility comments: Max to Total A of 2 to bring legs over EOB and support trunk. no control, strong posterior lean noted.   Transfers                 General transfer comment: attempted but defered for patient safety. Pt likely more confused this visit than prior PT evaluation and unable to follow basic cues for standing/transfer. fully extened back and legs upon EOB and required return to bed, grimacing in pain   Ambulation/Gait                 Stairs             Wheelchair Mobility    Modified Rankin (Stroke Patients Only)        Balance Overall balance assessment: Needs assistance Sitting-balance support: Bilateral upper extremity supported;Feet supported Sitting balance-Leahy Scale: Poor                                      Cognition Arousal/Alertness: Awake/alert Behavior During Therapy: WFL for tasks assessed/performed Overall Cognitive Status: History of cognitive impairments - at baseline                                 General Comments: Dementia at baseline, following commands 50% of time with delayed processing.       Exercises      General Comments        Pertinent Vitals/Pain Pain Assessment: Faces Faces Pain Scale: Hurts even more Pain Location: back Pain Descriptors / Indicators: Grimacing;Guarding Pain Intervention(s): Limited activity within patient's tolerance;Monitored during session    Home Living                      Prior Function            PT Goals (current goals can now be found in the care plan section) Acute Rehab PT Goals Patient Stated Goal: non stated PT  Goal Formulation: With family Time For Goal Achievement: 11/13/2017 Potential to Achieve Goals: Fair Progress towards PT goals: Not progressing toward goals - comment(limited by confusion/pain )    Frequency    Min 2X/week      PT Plan Current plan remains appropriate    Co-evaluation PT/OT/SLP Co-Evaluation/Treatment: Yes Reason for Co-Treatment: For patient/therapist safety PT goals addressed during session: Mobility/safety with mobility;Balance;Proper use of DME        AM-PAC PT "6 Clicks" Daily Activity  Outcome Measure  Difficulty turning over in bed (including adjusting bedclothes, sheets and blankets)?: Unable Difficulty moving from lying on back to sitting on the side of the bed? : Unable Difficulty sitting down on and standing up from a chair with arms (e.g., wheelchair, bedside commode, etc,.)?: Unable Help needed moving to and from a bed to chair  (including a wheelchair)?: Total Help needed walking in hospital room?: Total Help needed climbing 3-5 steps with a railing? : Total 6 Click Score: 6    End of Session Equipment Utilized During Treatment: Gait belt Activity Tolerance: Patient limited by pain Patient left: in bed;with call bell/phone within reach;with bed alarm set Nurse Communication: Mobility status PT Visit Diagnosis: Unsteadiness on feet (R26.81);Other abnormalities of gait and mobility (R26.89);Muscle weakness (generalized) (M62.81);History of falling (Z91.81);Pain Pain - part of body: (back)     Time: 1025-8527 PT Time Calculation (min) (ACUTE ONLY): 26 min  Charges:  $Therapeutic Activity: 8-22 mins                     Reinaldo Berber, PT, DPT Acute Rehabilitation Services Pager: [phone] Office: [phone]     Reinaldo Berber 11/18/2017, 11:22 AM

## 2017-11-18 NOTE — NC FL2 (Signed)
[place] LEVEL OF CARE SCREENING TOOL     IDENTIFICATION  Patient Name: Allison Page Birthdate: April 08, 1922 Sex: female Admission Date (Current Location): 11/16/2017  [facility] and [place] Number:  Herbalist and Address:  [facility] [address], [place], [place] [address]      Provider Number: [id number]  Attending Physician Name and Address:  Domenic Polite, MD  Relative Name and Phone Number:       Current Level of Care: Hospital Recommended Level of Care: [place] Prior Approval Number:    Date Approved/Denied:   PASRR Number:    Discharge Plan: Other (Comment)(Assisted Living)    Current Diagnoses: Patient Active Problem List   Diagnosis Date Noted  . Hypoxemia 11/16/2017  . Degenerative joint disease of knee, right 05/31/2017  . Vitamin D deficiency 02/09/2017  . Rash 02/09/2017  . Low back pain 02/09/2017  . Elevated PTHrP level 09/28/2016  . Cough 07/14/2016  . Right leg swelling 03/30/2016  . Dysphagia 03/30/2016  . Urinary incontinence 03/30/2016  . Chronic pain 03/30/2016  . Bilateral shoulder pain 03/30/2016  . Left lumbar radiculitis 02/06/2015  . Moderate dementia without behavioral disturbance ([place]) 01/01/2015  . Essential hypertension 01/01/2015  . Other specified hypothyroidism 01/01/2015  . Type 2 diabetes mellitus without complication, without long-term current use of insulin ([place]) 01/01/2015  . Rotator cuff tear arthropathy of both shoulders 12/09/2014  . Dementia ([place]) 11/19/2014  . Constipation 11/19/2014  . Right shoulder pain 11/19/2014  . Preventative health care 09/05/2014  . Hyperlipidemia 09/05/2014  . Urinary frequency 09/05/2014  . Allergic rhinitis 09/05/2014  . Hypothyroidism 03/22/2014  . TIA (transient ischemic attack) 03/22/2014  . DM type 2 (diabetes mellitus, type 2) ([place]) 03/22/2014  . GERD (gastroesophageal reflux disease) 03/22/2014  . Essential  hypertension, benign 03/22/2014  . Neck pain, chronic 03/22/2014  . Diverticulosis of colon without hemorrhage 03/22/2014  . Iron deficiency anemia 03/21/2014  . Anemia of chronic renal failure, stage 3 (moderate) (HCC) 03/21/2014    Orientation RESPIRATION BLADDER Height & Weight     Self  O2(Nasal Cannula 3L) External catheter, Incontinent(placed 10/16) Weight: 104 lb 11.5 oz (47.5 kg) Height:  5\' 3"  (160 cm)  BEHAVIORAL SYMPTOMS/MOOD NEUROLOGICAL BOWEL NUTRITION STATUS      Continent Diet(DYS 2, thin liquids)  AMBULATORY STATUS COMMUNICATION OF NEEDS Skin   Extensive Assist Verbally Normal                       Personal Care Assistance Level of Assistance  Bathing, Feeding, Dressing Bathing Assistance: Maximum assistance Feeding assistance: Independent Dressing Assistance: Maximum assistance     Functional Limitations Info  Sight, Hearing, Speech Sight Info: Adequate Hearing Info: Adequate Speech Info: Adequate    SPECIAL CARE FACTORS FREQUENCY  PT (By licensed PT), OT (By licensed OT)     PT Frequency: 2x OT Frequency: 2x            Contractures Contractures Info: Not present    Additional Factors Info  Code Status, Allergies Code Status Info: DNR Allergies Info: Allegra Allergy Fexofenadine Hcl, Amoxicillin, Penicillins, Sulfa Antibiotics           Current Medications (11/18/2017):  This is the current hospital active medication list Current Facility-Administered Medications  Medication Dose Route Frequency Provider Last Rate Last Dose  . acetaminophen (TYLENOL) tablet 650 mg  650 mg Oral Q6H PRN Arrien, Jimmy Picket, MD  Or  . acetaminophen (TYLENOL) suppository 650 mg  650 mg Rectal Q6H PRN Arrien, Jimmy Picket, MD      . acetaminophen (TYLENOL) tablet 650 mg  650 mg Oral BID Tawni Millers, MD   650 mg at 11/18/17 0935  . aspirin EC tablet 81 mg  81 mg Oral Daily Arrien, Jimmy Picket, MD   81 mg at 11/18/17 0935  .  atorvastatin (LIPITOR) tablet 20 mg  20 mg Oral QHS Arrien, Jimmy Picket, MD   20 mg at 11/17/17 2100  . azithromycin (ZITHROMAX) 500 mg in sodium chloride 0.9 % 250 mL IVPB  500 mg Intravenous Q24H Domenic Polite, MD 250 mL/hr at 11/17/17 2100 500 mg at 11/17/17 2100  . benzonatate (TESSALON) capsule 100 mg  100 mg Oral BID Domenic Polite, MD   100 mg at 11/18/17 1355  . cefTRIAXone (ROCEPHIN) 1 g in sodium chloride 0.9 % 100 mL IVPB  1 g Intravenous Q24H Domenic Polite, MD 200 mL/hr at 11/17/17 2002 1 g at 11/17/17 2002  . cholecalciferol (VITAMIN D) tablet 2,000 Units  2,000 Units Oral Daily Tawni Millers, MD   2,000 Units at 11/18/17 0935  . darifenacin (ENABLEX) 24 hr tablet 7.5 mg  7.5 mg Oral Daily Arrien, Jimmy Picket, MD   7.5 mg at 11/18/17 0934  . donepezil (ARICEPT) tablet 10 mg  10 mg Oral Daily Arrien, Jimmy Picket, MD   10 mg at 11/18/17 0935  . enoxaparin (LOVENOX) injection 30 mg  30 mg Subcutaneous Daily Arrien, Jimmy Picket, MD   30 mg at 11/18/17 0935  . guaiFENesin (ROBITUSSIN) 100 MG/5ML solution 100 mg  5 mL Oral Q4H PRN Arrien, Jimmy Picket, MD   100 mg at 11/18/17 1355  . HYDROcodone-acetaminophen (NORCO/VICODIN) 5-325 MG per tablet 1 tablet  1 tablet Oral Q4H PRN Arrien, Jimmy Picket, MD   1 tablet at 11/18/17 0935  . ipratropium-albuterol (DUONEB) 0.5-2.5 (3) MG/3ML nebulizer solution 3 mL  3 mL Nebulization Q2H PRN Arrien, Jimmy Picket, MD      . ipratropium-albuterol (DUONEB) 0.5-2.5 (3) MG/3ML nebulizer solution 3 mL  3 mL Nebulization TID Arrien, Jimmy Picket, MD   3 mL at 11/18/17 0813  . levothyroxine (SYNTHROID, LEVOTHROID) tablet 100 mcg  100 mcg Oral QAC breakfast Arrien, Jimmy Picket, MD   100 mcg at 11/18/17 0540  . LORazepam (ATIVAN) tablet 0.25 mg  0.25 mg Oral Daily Arrien, Jimmy Picket, MD   0.25 mg at 11/18/17 0933  . magnesium oxide (MAG-OX) tablet 400 mg  400 mg Oral Daily Arrien, Jimmy Picket, MD   400 mg at  11/18/17 0935  . MEDLINE mouth rinse  15 mL Mouth Rinse BID Domenic Polite, MD   15 mL at 11/17/17 2102  . metoprolol succinate (TOPROL-XL) 24 hr tablet 25 mg  25 mg Oral Daily Arrien, Jimmy Picket, MD   25 mg at 11/18/17 0935  . ondansetron (ZOFRAN) tablet 4 mg  4 mg Oral Q6H PRN Arrien, Jimmy Picket, MD       Or  . ondansetron [facility]) injection 4 mg  4 mg Intravenous Q6H PRN Arrien, Jimmy Picket, MD      . pantoprazole (PROTONIX) EC tablet 40 mg  40 mg Oral BID AC Arrien, Jimmy Picket, MD   40 mg at 11/18/17 0540  . polyethylene glycol (MIRALAX / GLYCOLAX) packet 17 g  17 g Oral Daily Tawni Millers, MD   17 g at 11/18/17 2683  . potassium chloride SA (K-DUR,KLOR-CON)  CR tablet 40 mEq  40 mEq Oral BID Domenic Polite, MD   40 mEq at 11/18/17 1354  . triamcinolone (NASACORT) nasal inhaler 2 spray  2 spray Nasal Daily Arrien, Jimmy Picket, MD   2 spray at 11/18/17 [phone]  . vitamin B-12 (CYANOCOBALAMIN) tablet 1,000 mcg  1,000 mcg Oral Daily Arrien, Jimmy Picket, MD   1,000 mcg at 11/18/17 5784     Discharge Medications: Please see discharge summary for a list of discharge medications.  Relevant Imaging Results:  Relevant Lab Results:   Additional Information SSN: [id number]  Eileen Stanford, LCSW

## 2017-11-19 LAB — CBC
HEMATOCRIT: 31.2 % — AB (ref 36.0–46.0)
Hemoglobin: 9.3 g/dL — ABNORMAL LOW (ref 12.0–15.0)
MCH: 29.7 pg (ref 26.0–34.0)
MCHC: 29.8 g/dL — AB (ref 30.0–36.0)
MCV: 99.7 fL (ref 80.0–100.0)
NRBC: 1.1 % — AB (ref 0.0–0.2)
PLATELETS: 452 10*3/uL — AB (ref 150–400)
RBC: 3.13 MIL/uL — ABNORMAL LOW (ref 3.87–5.11)
RDW: 22 % — AB (ref 11.5–15.5)
WBC: 3.6 10*3/uL — AB (ref 4.0–10.5)

## 2017-11-19 LAB — URINALYSIS, ROUTINE W REFLEX MICROSCOPIC
Bilirubin Urine: NEGATIVE
Glucose, UA: NEGATIVE mg/dL
KETONES UR: NEGATIVE mg/dL
Leukocytes, UA: NEGATIVE
NITRITE: NEGATIVE
PROTEIN: 100 mg/dL — AB
Specific Gravity, Urine: 1.012 (ref 1.005–1.030)
pH: 7 (ref 5.0–8.0)

## 2017-11-19 LAB — BASIC METABOLIC PANEL
ANION GAP: 5 (ref 5–15)
BUN: 34 mg/dL — ABNORMAL HIGH (ref 8–23)
CALCIUM: 12.9 mg/dL — AB (ref 8.9–10.3)
CO2: 25 mmol/L (ref 22–32)
CREATININE: 2.21 mg/dL — AB (ref 0.44–1.00)
Chloride: 110 mmol/L (ref 98–111)
GFR, EST AFRICAN AMERICAN: 21 mL/min — AB (ref >60)
GFR, EST NON AFRICAN AMERICAN: 18 mL/min — AB (ref >60)
Glucose, Bld: 98 mg/dL (ref 70–99)
Potassium: 4 mmol/L (ref 3.5–5.1)
SODIUM: 140 mmol/L (ref 135–145)

## 2017-11-19 LAB — GLUCOSE, CAPILLARY: GLUCOSE-CAPILLARY: 100 mg/dL — AB (ref 70–99)

## 2017-11-19 MED ORDER — HYDRALAZINE HCL 20 MG/ML IJ SOLN
10.0000 mg | Freq: Four times a day (QID) | INTRAMUSCULAR | Status: DC | PRN
  Administered 2017-11-23: 10 mg via INTRAVENOUS
  Filled 2017-11-19: qty 1

## 2017-11-19 MED ORDER — AZITHROMYCIN 250 MG PO TABS
500.0000 mg | ORAL_TABLET | Freq: Every day | ORAL | Status: DC
  Administered 2017-11-19 – 2017-11-21 (×3): 500 mg via ORAL
  Filled 2017-11-19 (×4): qty 2

## 2017-11-19 NOTE — Progress Notes (Signed)
PROGRESS NOTE    Allison Page [facility]  MHD:[id number] DOB: 1922-07-17 DOA: 11/16/2017 PCP: Jacelyn Pi, NP  Brief Narrative: Allison Page is a 84 y.o. female with medical history significant of osteoarthritis, dementia, type 2 diabetes mellitus, hypertension, dyslipidemia, hypothyroidism, iron deficiency anemia, history of transitory ischemic attacks, chronic kidney disease stage III, and anemia of chronic disease.  About 7 days ago she was diagnosed with bronchitis, she had an inhaler/bronchodilator and a cough suppressive agent prescribed.  Her symptoms continued to deteriorate, consistent with productive cough, congestion, and wheezing.  On the day of admission she had severe dyspnea. -in ED she was hypoxic, chest x-ray showed pneumonia versus atelectasis -more drowsy today  Assessment & Plan:   Acute hypoxic respiratory failure -Due to bronchitis versus early pneumonia -Repeat chest x-ray with bibasilar atelectasis -transitioned to ceftriaxone and azithromycin  -was improving but more lethargic today -Pulm toilet, nebs scheduled and as needed -Mucinex,  Tessalon Perles -Weaned off O2 -SLP evaluation: mild Aspiration risk suspected, started on dysphagia 2 diet with thin liquids  Lethargy/Urinary retention -check UA/urine CX -place foley -already on ceftriaxone, will continue  Chronic anemia -Due to chronic kidney disease and iron deficiency -transfused 1 unit PRBC overnight on admission -Anemia panel suggestive of chronic disease  Acute metabolic encephalopathy -In the setting of infection, chronic dementia -Caution with sedating meds benzos etc.  Chronic kidney disease stage III -Creatinine at baseline of 2  Hypothyroidism -Continue Synthroid  Hypertension -Continue metoprolol  Osteoarthritis/chronic pain -Continue home regimen of oxycodone PRN  DVT prophylaxis: Lovenox Code Status: DNR Family Communication: No family at bedside, called and d/w daughter  [name] Disposition Plan: Back to ALF with [facility] services  Consultants:      Procedures:   Antimicrobials:    Subjective: -drowsy this am, lethargic most of the morning, having Urinary retention  Objective: Vitals:   11/18/17 2300 11/19/17 0742 11/19/17 0831 11/19/17 0915  BP: (!) 140/48   (!) 148/72  Pulse: 61 61 62   Resp: 20  20   Temp: 97.8 F (36.6 C) 97.7 F (36.5 C)    TempSrc: Oral Oral    SpO2: 94% 99% 98%   Weight:      Height:        Intake/Output Summary (Last 24 hours) at 11/19/2017 1401 Last data filed at 11/19/2017 0600 Gross per 24 hour  Intake 120 ml  Output 200 ml  Net -80 ml   Filed Weights   11/16/17 1855 11/16/17 2314  Weight: 51.2 kg 47.5 kg    Examination:  Gen: frail, elderly female, somnolent, arouses to verbal stimuli HEENT: PERRLA, Neck supple, no JVD Lungs: few scattered ronchi CVS: RRR,No Gallops,Rubs or new Murmurs Abd: soft, Non tender, non distended, BS present Extremities: No Cyanosis, Clubbing or edema Skin: no new rashes Psychiatry: unable to assess.     Data Reviewed:   CBC: Recent Labs  Lab 11/15/17 1333 11/16/17 1909 11/17/17 0305 11/17/17 1522 11/18/17 0628 11/19/17 0224  WBC 5.2 4.8 4.8 5.6 5.0 3.6*  NEUTROABS 3.3  --   --   --   --   --   HGB 7.9* 7.4* 6.9* 10.2* 10.4* 9.3*  HCT 26.1* 23.9* 21.9* 32.8* 32.3* 31.2*  MCV 110.6* 110.6* 107.9* 97.9 96.1 99.7  PLT 375 352 344 479* 479* 211*   Basic Metabolic Panel: Recent Labs  Lab 11/15/17 1333 11/16/17 1909 11/17/17 0305 11/18/17 0628 11/19/17 0224  NA 135 134* 136 137 140  K 3.9 3.5  3.2* 3.3* 4.0  CL 110* 104 106 104 110  CO2 26 23 22 22 25   GLUCOSE 161* 130* 126* 154* 98  BUN 39* 43* 34* 25* 34*  CREATININE 2.20* 2.28* 2.01* 1.71* 2.21*  CALCIUM 12.8* 12.8* 12.3* 12.9* 12.9*   GFR: Estimated Creatinine Clearance: 11.9 mL/min (A) (by C-G formula based on SCr of 2.21 mg/dL (H)). Liver Function Tests: Recent Labs  Lab 11/15/17 1333  AST  21  ALT 16  ALKPHOS 104*  BILITOT 0.4  PROT 7.1  ALBUMIN 3.3*   No results for input(s): LIPASE, AMYLASE in the last 168 hours. No results for input(s): AMMONIA in the last 168 hours. Coagulation Profile: No results for input(s): INR, PROTIME in the last 168 hours. Cardiac Enzymes: No results for input(s): CKTOTAL, CKMB, CKMBINDEX, TROPONINI in the last 168 hours. BNP (last 3 results) No results for input(s): PROBNP in the last 8760 hours. HbA1C: No results for input(s): HGBA1C in the last 72 hours. CBG: Recent Labs  Lab 11/16/17 1928 11/19/17 0743  GLUCAP 132* 100*   Lipid Profile: No results for input(s): CHOL, HDL, LDLCALC, TRIG, CHOLHDL, LDLDIRECT in the last 72 hours. Thyroid Function Tests: No results for input(s): TSH, T4TOTAL, FREET4, T3FREE, THYROIDAB in the last 72 hours. Anemia Panel: Recent Labs    11/17/17 1051  VITAMINB12 3,660*  FOLATE 10.5  FERRITIN 117  TIBC 252  IRON 76  RETICCTPCT 1.7   Urine analysis:    Component Value Date/Time   COLORURINE YELLOW 11/16/2017 0000   APPEARANCEUR CLOUDY (A) 11/16/2017 0000   APPEARANCEUR Cloudy (A) 07/05/2014 1622   LABSPEC 1.010 11/16/2017 0000   PHURINE 6.0 11/16/2017 0000   GLUCOSEU NEGATIVE 11/16/2017 0000   GLUCOSEU NEGATIVE 09/28/2016 1500   HGBUR SMALL (A) 11/16/2017 0000   BILIRUBINUR NEGATIVE 11/16/2017 0000   BILIRUBINUR Negative 07/05/2014 1622   KETONESUR NEGATIVE 11/16/2017 0000   PROTEINUR 30 (A) 11/16/2017 0000   UROBILINOGEN 0.2 09/28/2016 1500   NITRITE NEGATIVE 11/16/2017 0000   LEUKOCYTESUR NEGATIVE 11/16/2017 0000   LEUKOCYTESUR Trace (A) 07/05/2014 1622   Sepsis Labs: @LABRCNTIP (procalcitonin:4,lacticidven:4)  ) Recent Results (from the past 240 hour(s))  Blood culture (routine x 2)     Status: None (Preliminary result)   Collection Time: 11/16/17  7:35 PM  Result Value Ref Range Status   Specimen Description BLOOD RIGHT ANTECUBITAL  Final   Special Requests   Final     BOTTLES DRAWN AEROBIC AND ANAEROBIC Blood Culture adequate volume   Culture   Final    NO GROWTH 3 DAYS Performed at [facility], [place] [address], [place], [place] [address]    Report Status PENDING  Incomplete  Blood culture (routine x 2)     Status: None (Preliminary result)   Collection Time: 11/16/17  7:42 PM  Result Value Ref Range Status   Specimen Description BLOOD RIGHT HAND  Final   Special Requests   Final    BOTTLES DRAWN AEROBIC AND ANAEROBIC Blood Culture results may not be optimal due to an inadequate volume of blood received in culture bottles   Culture   Final    NO GROWTH 3 DAYS Performed at [facility], [place] [address], [place], [place] [address]    Report Status PENDING  Incomplete  Respiratory Panel by PCR     Status: None   Collection Time: 11/17/17  5:57 AM  Result Value Ref Range Status   Adenovirus NOT DETECTED NOT DETECTED Final   Coronavirus  229E NOT DETECTED NOT DETECTED Final   Coronavirus HKU1 NOT DETECTED NOT DETECTED Final   Coronavirus NL63 NOT DETECTED NOT DETECTED Final   Coronavirus OC43 NOT DETECTED NOT DETECTED Final   Metapneumovirus NOT DETECTED NOT DETECTED Final   Rhinovirus / Enterovirus NOT DETECTED NOT DETECTED Final   Influenza A NOT DETECTED NOT DETECTED Final   Influenza B NOT DETECTED NOT DETECTED Final   Parainfluenza Virus 1 NOT DETECTED NOT DETECTED Final   Parainfluenza Virus 2 NOT DETECTED NOT DETECTED Final   Parainfluenza Virus 3 NOT DETECTED NOT DETECTED Final   Parainfluenza Virus 4 NOT DETECTED NOT DETECTED Final   Respiratory Syncytial Virus NOT DETECTED NOT DETECTED Final   Bordetella pertussis NOT DETECTED NOT DETECTED Final   Chlamydophila pneumoniae NOT DETECTED NOT DETECTED Final   Mycoplasma pneumoniae NOT DETECTED NOT DETECTED Final         Radiology Studies: No results found.      Scheduled Meds: . acetaminophen  650 mg Oral BID  . aspirin EC  81 mg Oral Daily  . atorvastatin  20 mg  Oral QHS  . azithromycin  500 mg Oral Daily  . benzonatate  100 mg Oral BID  . cholecalciferol  2,000 Units Oral Daily  . darifenacin  7.5 mg Oral Daily  . donepezil  10 mg Oral Daily  . enoxaparin (LOVENOX) injection  30 mg Subcutaneous Daily  . ipratropium-albuterol  3 mL Nebulization TID  . levothyroxine  100 mcg Oral QAC breakfast  . magnesium oxide  400 mg Oral Daily  . mouth rinse  15 mL Mouth Rinse BID  . metoprolol succinate  25 mg Oral Daily  . pantoprazole  40 mg Oral BID AC  . polyethylene glycol  17 g Oral Daily  . triamcinolone  2 spray Nasal Daily  . vitamin B-12  1,000 mcg Oral Daily   Continuous Infusions: . cefTRIAXone (ROCEPHIN)  IV 1 g (11/18/17 2006)     LOS: 3 days    Time spent: 80min    Domenic Polite, MD [facility] Page via [email or web address], password [email or web address] After 7PM please contact night-coverage  11/19/2017, 2:01 PM

## 2017-11-19 NOTE — Progress Notes (Signed)
This patient is receiving the antibiotic azithromycin by the intravenous route. Based on criteria approved by the Pharmacy and Therapeutics Committee, and the Infectious Disease Division, the antibiotic(s) is / are being converted to equivalent oral dose form(s). These criteria include:   Patient being treated for a respiratory tract infection, urinary tract infection, cellulitis, or Clostridium Difficile Associated Diarrhea  The patient is not neutropenic and does not exhibit a GI malabsorption state  The patient is eating (either orally or per tube) and/or has been taking other orally administered medications for at least 24 hours.  The patient is improving clinically (physician assessment and a 24-hour Tmax of 100.5? F).  If you have questions about this conversion, please contact the pharmacy department.  Thank you for allowing pharmacy to be a part of this patient's care.  Allison Page, PharmD PGY1 Pharmacy Resident Phone: [phone]  Please check AMION for all [facility] Pharmacy phone numbers

## 2017-11-19 NOTE — Plan of Care (Signed)
Pt with poor appetite, confusion.  During this assessment, pt will respond to voice, is oriented to place only.  Pt with urinary retention today, now has foley catheter.

## 2017-11-19 NOTE — Progress Notes (Signed)
Called facility [facility]) and was informed they will not be able to receive pt until Monday at the earliest. CSW acknowledged and will relay to medical team. Will continue to assist as needed.

## 2017-11-20 LAB — URINE CULTURE: CULTURE: NO GROWTH

## 2017-11-20 LAB — BASIC METABOLIC PANEL
Anion gap: 4 — ABNORMAL LOW (ref 5–15)
BUN: 44 mg/dL — ABNORMAL HIGH (ref 8–23)
CHLORIDE: 110 mmol/L (ref 98–111)
CO2: 25 mmol/L (ref 22–32)
Calcium: 13 mg/dL — ABNORMAL HIGH (ref 8.9–10.3)
Creatinine, Ser: 2.3 mg/dL — ABNORMAL HIGH (ref 0.44–1.00)
GFR calc Af Amer: 20 mL/min — ABNORMAL LOW (ref >60)
GFR calc non Af Amer: 17 mL/min — ABNORMAL LOW (ref >60)
Glucose, Bld: 126 mg/dL — ABNORMAL HIGH (ref 70–99)
POTASSIUM: 4.3 mmol/L (ref 3.5–5.1)
SODIUM: 139 mmol/L (ref 135–145)

## 2017-11-20 LAB — CBC
HCT: 28.6 % — ABNORMAL LOW (ref 36.0–46.0)
Hemoglobin: 8.6 g/dL — ABNORMAL LOW (ref 12.0–15.0)
MCH: 30.3 pg (ref 26.0–34.0)
MCHC: 30.1 g/dL (ref 30.0–36.0)
MCV: 100.7 fL — AB (ref 80.0–100.0)
NRBC: 0.8 % — AB (ref 0.0–0.2)
PLATELETS: 467 10*3/uL — AB (ref 150–400)
RBC: 2.84 MIL/uL — ABNORMAL LOW (ref 3.87–5.11)
RDW: 21.8 % — AB (ref 11.5–15.5)
WBC: 4 10*3/uL (ref 4.0–10.5)

## 2017-11-20 MED ORDER — IPRATROPIUM-ALBUTEROL 0.5-2.5 (3) MG/3ML IN SOLN
3.0000 mL | Freq: Two times a day (BID) | RESPIRATORY_TRACT | Status: DC
  Administered 2017-11-20 – 2017-11-24 (×8): 3 mL via RESPIRATORY_TRACT
  Filled 2017-11-20 (×9): qty 3

## 2017-11-20 MED ORDER — DEXTROSE 5 % IV SOLN
500.0000 mg | INTRAVENOUS | Status: DC
  Administered 2017-11-20: 500 mg via INTRAVENOUS
  Filled 2017-11-20 (×2): qty 0.5

## 2017-11-20 MED ORDER — SODIUM CHLORIDE 0.9 % IV SOLN
INTRAVENOUS | Status: AC
  Administered 2017-11-20: 09:00:00 via INTRAVENOUS

## 2017-11-20 NOTE — Progress Notes (Signed)
Pharmacy Antibiotic Note  Allison Page is a 84 y.o. female admitted on 11/16/2017 with pneumonia and UTI.  Pharmacy has been consulted for Cefepime dosing. Pt was previously on ceftriaxone + azithro and not improving. Will broaden coverage to cefepime + azithro. Pt is CKD III Scr 2.30 trending up (b/l 2), afebrile, WBC WNL. Pt has PCN allergy but has been tolerating cephalosporins.   Plan: Cefepime 500mg  IV daily Azithromycin 500mg  PO daily Monitor renal function and clinical improvement  Height: 5\' 3"  (160 cm) Weight: 104 lb 11.5 oz (47.5 kg) IBW/kg (Calculated) : 52.4  Temp (24hrs), Avg:98.6 F (37 C), Min:97.9 F (36.6 C), Max:99.3 F (37.4 C)  Recent Labs  Lab 11/16/17 1909 11/16/17 1952 11/17/17 0305 11/17/17 1522 11/18/17 0628 11/19/17 0224 11/20/17 0215  WBC 4.8  --  4.8 5.6 5.0 3.6* 4.0  CREATININE 2.28*  --  2.01*  --  1.71* 2.21* 2.30*  LATICACIDVEN  --  0.99  --   --   --   --   --     Estimated Creatinine Clearance: 11.5 mL/min (A) (by C-G formula based on SCr of 2.3 mg/dL (H)).    Allergies  Allergen Reactions  . Allegra Allergy [Fexofenadine Hcl] Anaphylaxis  . Amoxicillin   . Penicillins   . Sulfa Antibiotics     Antimicrobials this admission: levofloxacin 10/17 >> 10/17 doxycycline 10/16 >> 10/17 Ceftriaxone 10/17>>10/20 Azithromycin 10/17>> Cefepime 10/20>>  Dose adjustments this admission:  Microbiology results: 10/16 BCx: NGTD 10/19 UCx:   10/17 Resp: negative  Thank you for allowing pharmacy to be a part of this patient's care.  Burnard Leigh [facility] 11/20/2017 8:08 AM

## 2017-11-20 NOTE — Progress Notes (Signed)
PROGRESS NOTE    Tuyet [facility]  OHY:[id number] DOB: 05-Jul-1922 DOA: 11/16/2017 PCP: Jacelyn Pi, NP  Brief Narrative: Allison Page is a 84 y.o. female with medical history significant of osteoarthritis, dementia, type 2 diabetes mellitus, hypertension, dyslipidemia, hypothyroidism, iron deficiency anemia, history of transitory ischemic attacks, chronic kidney disease stage III, and anemia of chronic disease.  About 7 days ago she was diagnosed with bronchitis, she had an inhaler/bronchodilator and a cough suppressive agent prescribed.  Her symptoms continued to deteriorate, consistent with productive cough, congestion, and wheezing.  On the day of admission she had severe dyspnea. -in ED she was hypoxic, chest x-ray showed pneumonia versus atelectasis -more drowsy today  Assessment & Plan:   Acute hypoxic respiratory failure -Due to bronchitis versus early pneumonia -Repeat chest x-ray with bibasilar atelectasis -Was on IV ceftriaxone and azithromycin, this was changed to cefepime and azithromycin due to UTI and lethargy while on ceftriaxone -Continue pulmonary toilet, nebulizations -Mucinex, Tessalon Perles, wean O2 -SLP evaluation: Mild aspiration risk suspected, started dysphagia 2 diet with thin liquids  UTI/urinary retention -10/19 developed acute urinary retention with lethargy, urinalysis was highly suggestive of urinary tract infection, Foley catheter had to be placed for retention -Antibiotics broadened to cefepime -Follow-up urine cultures  Chronic anemia -Due to chronic kidney disease and iron deficiency -transfused 1 unit PRBC on admission -Anemia panel suggestive of chronic disease -Globin is stable  Acute metabolic encephalopathy -In the setting of infection, chronic dementia -Caution with sedating meds benzos etc. -improving today  Acute kidney injury on chronic kidney disease stage III -Slightly worsening of creatinine to 2.4 -Gentle IV fluids for  12 hours -Baseline creatinine is 2  Hypothyroidism -Continue Synthroid  Hypertension -Continue metoprolol  Osteoarthritis/chronic pain -Continue home regimen of oxycodone PRN  DVT prophylaxis: Lovenox Code Status: DNR Family Communication: No family at bedside, called and d/w daughter [name] Disposition Plan: may need SNF  Consultants:      Procedures:   Antimicrobials:    Subjective: -More lucid and interactive today -Had Foley catheter placed due to urinary retention and development of UTI  Objective: Vitals:   11/19/17 2300 11/20/17 0805 11/20/17 0816 11/20/17 0830  BP: (!) 152/54  (!) 193/48 (!) 170/58  Pulse: 63 70 69 70  Resp: 18 18 12    Temp: 97.9 F (36.6 C)  97.6 F (36.4 C)   TempSrc: Oral  Oral   SpO2: 98% 97% 100%   Weight:      Height:        Intake/Output Summary (Last 24 hours) at 11/20/2017 1039 Last data filed at 11/19/2017 2300 Gross per 24 hour  Intake 100 ml  Output -  Net 100 ml   Filed Weights   11/16/17 1855 11/16/17 2314  Weight: 51.2 kg 47.5 kg    Examination:  Gen: Awake, Alert, Oriented X 2 alert and interactive this morning HEENT: PERRLA Lungs: Few scattered rhonchi CVS: RRR, Abd: soft, Non tender, non distended, BS present Extremities: No edema Skin: no new rashes Psychiatry: unable to assess.     Data Reviewed:   CBC: Recent Labs  Lab 11/15/17 1333  11/17/17 0305 11/17/17 1522 11/18/17 0628 11/19/17 0224 11/20/17 0215  WBC 5.2   < > 4.8 5.6 5.0 3.6* 4.0  NEUTROABS 3.3  --   --   --   --   --   --   HGB 7.9*   < > 6.9* 10.2* 10.4* 9.3* 8.6*  HCT 26.1*   < >  21.9* 32.8* 32.3* 31.2* 28.6*  MCV 110.6*   < > 107.9* 97.9 96.1 99.7 100.7*  PLT 375   < > 344 479* 479* 452* 467*   < > = values in this interval not displayed.   Basic Metabolic Panel: Recent Labs  Lab 11/16/17 1909 11/17/17 0305 11/18/17 0628 11/19/17 0224 11/20/17 0215  NA 134* 136 137 140 139  K 3.5 3.2* 3.3* 4.0 4.3  CL 104 106  104 110 110  CO2 23 22 22 25 25   GLUCOSE 130* 126* 154* 98 126*  BUN 43* 34* 25* 34* 44*  CREATININE 2.28* 2.01* 1.71* 2.21* 2.30*  CALCIUM 12.8* 12.3* 12.9* 12.9* 13.0*   GFR: Estimated Creatinine Clearance: 11.5 mL/min (A) (by C-G formula based on SCr of 2.3 mg/dL (H)). Liver Function Tests: Recent Labs  Lab 11/15/17 1333  AST 21  ALT 16  ALKPHOS 104*  BILITOT 0.4  PROT 7.1  ALBUMIN 3.3*   No results for input(s): LIPASE, AMYLASE in the last 168 hours. No results for input(s): AMMONIA in the last 168 hours. Coagulation Profile: No results for input(s): INR, PROTIME in the last 168 hours. Cardiac Enzymes: No results for input(s): CKTOTAL, CKMB, CKMBINDEX, TROPONINI in the last 168 hours. BNP (last 3 results) No results for input(s): PROBNP in the last 8760 hours. HbA1C: No results for input(s): HGBA1C in the last 72 hours. CBG: Recent Labs  Lab 11/16/17 1928 11/19/17 0743  GLUCAP 132* 100*   Lipid Profile: No results for input(s): CHOL, HDL, LDLCALC, TRIG, CHOLHDL, LDLDIRECT in the last 72 hours. Thyroid Function Tests: No results for input(s): TSH, T4TOTAL, FREET4, T3FREE, THYROIDAB in the last 72 hours. Anemia Panel: Recent Labs    11/17/17 1051  VITAMINB12 3,660*  FOLATE 10.5  FERRITIN 117  TIBC 252  IRON 76  RETICCTPCT 1.7   Urine analysis:    Component Value Date/Time   COLORURINE YELLOW 11/19/2017 1535   APPEARANCEUR CLOUDY (A) 11/19/2017 1535   APPEARANCEUR Cloudy (A) 07/05/2014 1622   LABSPEC 1.012 11/19/2017 1535   PHURINE 7.0 11/19/2017 1535   GLUCOSEU NEGATIVE 11/19/2017 1535   GLUCOSEU NEGATIVE 09/28/2016 1500   HGBUR SMALL (A) 11/19/2017 1535   BILIRUBINUR NEGATIVE 11/19/2017 1535   BILIRUBINUR Negative 07/05/2014 1622   KETONESUR NEGATIVE 11/19/2017 1535   PROTEINUR 100 (A) 11/19/2017 1535   UROBILINOGEN 0.2 09/28/2016 1500   NITRITE NEGATIVE 11/19/2017 1535   LEUKOCYTESUR NEGATIVE 11/19/2017 1535   LEUKOCYTESUR Trace (A) 07/05/2014  1622   Sepsis Labs: @LABRCNTIP (procalcitonin:4,lacticidven:4)  ) Recent Results (from the past 240 hour(s))  Blood culture (routine x 2)     Status: None (Preliminary result)   Collection Time: 11/16/17  7:35 PM  Result Value Ref Range Status   Specimen Description BLOOD RIGHT ANTECUBITAL  Final   Special Requests   Final    BOTTLES DRAWN AEROBIC AND ANAEROBIC Blood Culture adequate volume   Culture   Final    NO GROWTH 3 DAYS Performed at [facility], [place] [address], [place], [place] [address]    Report Status PENDING  Incomplete  Blood culture (routine x 2)     Status: None (Preliminary result)   Collection Time: 11/16/17  7:42 PM  Result Value Ref Range Status   Specimen Description BLOOD RIGHT HAND  Final   Special Requests   Final    BOTTLES DRAWN AEROBIC AND ANAEROBIC Blood Culture results may not be optimal due to an inadequate volume of blood received in culture bottles  Culture   Final    NO GROWTH 3 DAYS Performed at [facility], [place] [address] [place], [place] [address]    Report Status PENDING  Incomplete  Respiratory Panel by PCR     Status: None   Collection Time: 11/17/17  5:57 AM  Result Value Ref Range Status   Adenovirus NOT DETECTED NOT DETECTED Final   Coronavirus 229E NOT DETECTED NOT DETECTED Final   Coronavirus HKU1 NOT DETECTED NOT DETECTED Final   Coronavirus NL63 NOT DETECTED NOT DETECTED Final   Coronavirus OC43 NOT DETECTED NOT DETECTED Final   Metapneumovirus NOT DETECTED NOT DETECTED Final   Rhinovirus / Enterovirus NOT DETECTED NOT DETECTED Final   Influenza A NOT DETECTED NOT DETECTED Final   Influenza B NOT DETECTED NOT DETECTED Final   Parainfluenza Virus 1 NOT DETECTED NOT DETECTED Final   Parainfluenza Virus 2 NOT DETECTED NOT DETECTED Final   Parainfluenza Virus 3 NOT DETECTED NOT DETECTED Final   Parainfluenza Virus 4 NOT DETECTED NOT DETECTED Final   Respiratory Syncytial Virus NOT DETECTED NOT DETECTED Final    Bordetella pertussis NOT DETECTED NOT DETECTED Final   Chlamydophila pneumoniae NOT DETECTED NOT DETECTED Final   Mycoplasma pneumoniae NOT DETECTED NOT DETECTED Final         Radiology Studies: No results found.      Scheduled Meds: . acetaminophen  650 mg Oral BID  . aspirin EC  81 mg Oral Daily  . atorvastatin  20 mg Oral QHS  . azithromycin  500 mg Oral Daily  . benzonatate  100 mg Oral BID  . ceFEPime (MAXIPIME) IV  500 mg Intravenous Q24H  . cholecalciferol  2,000 Units Oral Daily  . darifenacin  7.5 mg Oral Daily  . donepezil  10 mg Oral Daily  . enoxaparin (LOVENOX) injection  30 mg Subcutaneous Daily  . ipratropium-albuterol  3 mL Nebulization TID  . levothyroxine  100 mcg Oral QAC breakfast  . magnesium oxide  400 mg Oral Daily  . mouth rinse  15 mL Mouth Rinse BID  . metoprolol succinate  25 mg Oral Daily  . pantoprazole  40 mg Oral BID AC  . polyethylene glycol  17 g Oral Daily  . triamcinolone  2 spray Nasal Daily  . vitamin B-12  1,000 mcg Oral Daily   Continuous Infusions: . sodium chloride 75 mL/hr at 11/20/17 0845     LOS: 4 days    Time spent: 70min    Domenic Polite, MD [facility] Page via [email or web address], password [email or web address] After 7PM please contact night-coverage  11/20/2017, 10:39 AM

## 2017-11-21 LAB — BASIC METABOLIC PANEL
ANION GAP: 3 — AB (ref 5–15)
BUN: 36 mg/dL — ABNORMAL HIGH (ref 8–23)
CALCIUM: 12.7 mg/dL — AB (ref 8.9–10.3)
CO2: 26 mmol/L (ref 22–32)
CREATININE: 1.82 mg/dL — AB (ref 0.44–1.00)
Chloride: 109 mmol/L (ref 98–111)
GFR calc Af Amer: 26 mL/min — ABNORMAL LOW (ref >60)
GFR, EST NON AFRICAN AMERICAN: 23 mL/min — AB (ref >60)
Glucose, Bld: 114 mg/dL — ABNORMAL HIGH (ref 70–99)
Potassium: 4 mmol/L (ref 3.5–5.1)
Sodium: 138 mmol/L (ref 135–145)

## 2017-11-21 LAB — CULTURE, BLOOD (ROUTINE X 2)
CULTURE: NO GROWTH
CULTURE: NO GROWTH
Special Requests: ADEQUATE

## 2017-11-21 LAB — CBC
HCT: 28.2 % — ABNORMAL LOW (ref 36.0–46.0)
Hemoglobin: 8.6 g/dL — ABNORMAL LOW (ref 12.0–15.0)
MCH: 30.7 pg (ref 26.0–34.0)
MCHC: 30.5 g/dL (ref 30.0–36.0)
MCV: 100.7 fL — AB (ref 80.0–100.0)
NRBC: 0.8 % — AB (ref 0.0–0.2)
PLATELETS: 465 10*3/uL — AB (ref 150–400)
RBC: 2.8 MIL/uL — ABNORMAL LOW (ref 3.87–5.11)
RDW: 21.3 % — AB (ref 11.5–15.5)
WBC: 4 10*3/uL (ref 4.0–10.5)

## 2017-11-21 MED ORDER — CEFDINIR 300 MG PO CAPS
300.0000 mg | ORAL_CAPSULE | Freq: Every day | ORAL | Status: AC
  Administered 2017-11-21 – 2017-11-24 (×4): 300 mg via ORAL
  Filled 2017-11-21 (×4): qty 1

## 2017-11-21 NOTE — Progress Notes (Addendum)
PROGRESS NOTE    Tedi [facility]  UJW:[id number] DOB: [birth year] DOA: 11/16/2017 PCP: Jacelyn Pi, NP  Brief Narrative: Allison Page is a 84 y.o. female with medical history significant of osteoarthritis, dementia, type 2 diabetes mellitus, hypertension, dyslipidemia, hypothyroidism, iron deficiency anemia, history of transitory ischemic attacks, chronic kidney disease stage III, and anemia of chronic disease.  About 7 days ago she was diagnosed with bronchitis, she had an inhaler/bronchodilator and a cough suppressive agent prescribed.  Her symptoms continued to deteriorate, consistent with productive cough, congestion, and wheezing.  On the day of admission she had severe dyspnea. -in ED she was hypoxic, chest x-ray showed pneumonia versus atelectasis -more drowsy today  Assessment & Plan:   Acute hypoxic respiratory failure -Due to pneumonia +/- bronchitis -Repeat chest x-ray with bibasilar atelectasis -Was on IV ceftriaxone and azithromycin, this was changed to cefepime and azithromycin due to UTI and lethargy while on ceftriaxone -Improving and stable, will transition to oral Omnicef -Continue pulmonary toilet, nebulizations -Mucinex, Tessalon Perles, wean O2 -SLP evaluation: Mild aspiration risk suspected, started dysphagia 2 diet with thin liquids -Discharge planning, SNF tomorrow  UTI/urinary retention -10/19 developed acute urinary retention with lethargy, urinalysis was highly suggestive of urinary tract infection, Foley catheter had to be placed for retention -Antibiotics broadened to cefepime -Cultures negative -Addition to oral Omnicef -Discontinue Foley catheter  Chronic anemia -Due to chronic kidney disease and iron deficiency -transfused 1 unit PRBC on admission -Anemia panel suggestive of chronic disease -Hb is stable  Acute metabolic encephalopathy -In the setting of infection, chronic dementia -Caution with sedating meds benzos  etc. -improved  Acute kidney injury on chronic kidney disease stage III -Slightly worsening of creatinine to 2.4 -hydrated with gentle IV fluids, creatinine improved and back to baseline  Hypothyroidism -Continue Synthroid  Hypertension -Continue metoprolol  Osteoarthritis/chronic pain -Continue home regimen of oxycodone PRN  Hypercalcemia -ongoing for 1 year with slight worsening -CA in 12 range, was upto 13.4,  2-24months ago -chronic and stable, defer aggressive workup due to advanced age, if worsens check PTH, Vitamin D and SPEP -will need Bisphosphonate if worsens  DVT prophylaxis: Lovenox Code Status: DNR Family Communication: No family at bedside, called and d/w daughter [name] this weekend Disposition Plan: SNF in 1-2days  Consultants:      Procedures:   Antimicrobials:    Subjective: -More lucid and interactive today -Had Foley catheter placed due to urinary retention and development of UTI  Objective: Vitals:   11/20/17 2050 11/21/17 0036 11/21/17 0756 11/21/17 0824  BP: (!) 176/68 (!) 172/53  (!) 150/49  Pulse: 67 67  77  Resp: 15 17    Temp: 98.4 F (36.9 C) 98.4 F (36.9 C)    TempSrc: Oral Oral    SpO2: 99% 99% 94%   Weight:      Height:        Intake/Output Summary (Last 24 hours) at 11/21/2017 1349 Last data filed at 11/21/2017 0800 Gross per 24 hour  Intake 170 ml  Output 1726 ml  Net -1556 ml   Filed Weights   11/16/17 1855 11/16/17 2314  Weight: 51.2 kg 47.5 kg    Examination:  Gen: Awake, Alert, Oriented X 2, more alert and interactive this morning HEENT: PERRLA Lungs: Scattered rhonchi bilaterally CVS: S1-S2/regular rate rhythm Abd: soft, Non tender, non distended, BS present Extremities: No Cyanosis, Clubbing or edema Skin: no new rashes Psychiatry: pleasant, flat affect    Data Reviewed:   CBC: Recent  Labs  Lab 11/15/17 1333  11/17/17 1522 11/18/17 0628 11/19/17 0224 11/20/17 0215 11/21/17 0333  WBC 5.2    < > 5.6 5.0 3.6* 4.0 4.0  NEUTROABS 3.3  --   --   --   --   --   --   HGB 7.9*   < > 10.2* 10.4* 9.3* 8.6* 8.6*  HCT 26.1*   < > 32.8* 32.3* 31.2* 28.6* 28.2*  MCV 110.6*   < > 97.9 96.1 99.7 100.7* 100.7*  PLT 375   < > 479* 479* 452* 467* 465*   < > = values in this interval not displayed.   Basic Metabolic Panel: Recent Labs  Lab 11/17/17 0305 11/18/17 0628 11/19/17 0224 11/20/17 0215 11/21/17 0333  NA 136 137 140 139 138  K 3.2* 3.3* 4.0 4.3 4.0  CL 106 104 110 110 109  CO2 22 22 25 25 26   GLUCOSE 126* 154* 98 126* 114*  BUN 34* 25* 34* 44* 36*  CREATININE 2.01* 1.71* 2.21* 2.30* 1.82*  CALCIUM 12.3* 12.9* 12.9* 13.0* 12.7*   GFR: Estimated Creatinine Clearance: 14.5 mL/min (A) (by C-G formula based on SCr of 1.82 mg/dL (H)). Liver Function Tests: Recent Labs  Lab 11/15/17 1333  AST 21  ALT 16  ALKPHOS 104*  BILITOT 0.4  PROT 7.1  ALBUMIN 3.3*   No results for input(s): LIPASE, AMYLASE in the last 168 hours. No results for input(s): AMMONIA in the last 168 hours. Coagulation Profile: No results for input(s): INR, PROTIME in the last 168 hours. Cardiac Enzymes: No results for input(s): CKTOTAL, CKMB, CKMBINDEX, TROPONINI in the last 168 hours. BNP (last 3 results) No results for input(s): PROBNP in the last 8760 hours. HbA1C: No results for input(s): HGBA1C in the last 72 hours. CBG: Recent Labs  Lab 11/16/17 1928 11/19/17 0743  GLUCAP 132* 100*   Lipid Profile: No results for input(s): CHOL, HDL, LDLCALC, TRIG, CHOLHDL, LDLDIRECT in the last 72 hours. Thyroid Function Tests: No results for input(s): TSH, T4TOTAL, FREET4, T3FREE, THYROIDAB in the last 72 hours. Anemia Panel: No results for input(s): VITAMINB12, FOLATE, FERRITIN, TIBC, IRON, RETICCTPCT in the last 72 hours. Urine analysis:    Component Value Date/Time   COLORURINE YELLOW 11/19/2017 1535   APPEARANCEUR CLOUDY (A) 11/19/2017 1535   APPEARANCEUR Cloudy (A) 07/05/2014 1622   LABSPEC  1.012 11/19/2017 1535   PHURINE 7.0 11/19/2017 1535   GLUCOSEU NEGATIVE 11/19/2017 1535   GLUCOSEU NEGATIVE 09/28/2016 1500   HGBUR SMALL (A) 11/19/2017 1535   BILIRUBINUR NEGATIVE 11/19/2017 1535   BILIRUBINUR Negative 07/05/2014 1622   KETONESUR NEGATIVE 11/19/2017 1535   PROTEINUR 100 (A) 11/19/2017 1535   UROBILINOGEN 0.2 09/28/2016 1500   NITRITE NEGATIVE 11/19/2017 1535   LEUKOCYTESUR NEGATIVE 11/19/2017 1535   LEUKOCYTESUR Trace (A) 07/05/2014 1622   Sepsis Labs: @LABRCNTIP (procalcitonin:4,lacticidven:4)  ) Recent Results (from the past 240 hour(s))  Blood culture (routine x 2)     Status: None   Collection Time: 11/16/17  7:35 PM  Result Value Ref Range Status   Specimen Description BLOOD RIGHT ANTECUBITAL  Final   Special Requests   Final    BOTTLES DRAWN AEROBIC AND ANAEROBIC Blood Culture adequate volume   Culture   Final    NO GROWTH 5 DAYS Performed at [facility], [place] [address], [place], [place] [address]    Report Status 11/21/2017 FINAL  Final  Blood culture (routine x 2)     Status: None   Collection  Time: 11/16/17  7:42 PM  Result Value Ref Range Status   Specimen Description BLOOD RIGHT HAND  Final   Special Requests   Final    BOTTLES DRAWN AEROBIC AND ANAEROBIC Blood Culture results may not be optimal due to an inadequate volume of blood received in culture bottles   Culture   Final    NO GROWTH 5 DAYS Performed at [facility], [place] [address] [place], [place] [address]    Report Status 11/21/2017 FINAL  Final  Respiratory Panel by PCR     Status: None   Collection Time: 11/17/17  5:57 AM  Result Value Ref Range Status   Adenovirus NOT DETECTED NOT DETECTED Final   Coronavirus 229E NOT DETECTED NOT DETECTED Final   Coronavirus HKU1 NOT DETECTED NOT DETECTED Final   Coronavirus NL63 NOT DETECTED NOT DETECTED Final   Coronavirus OC43 NOT DETECTED NOT DETECTED Final   Metapneumovirus NOT DETECTED NOT DETECTED Final   Rhinovirus /  Enterovirus NOT DETECTED NOT DETECTED Final   Influenza A NOT DETECTED NOT DETECTED Final   Influenza B NOT DETECTED NOT DETECTED Final   Parainfluenza Virus 1 NOT DETECTED NOT DETECTED Final   Parainfluenza Virus 2 NOT DETECTED NOT DETECTED Final   Parainfluenza Virus 3 NOT DETECTED NOT DETECTED Final   Parainfluenza Virus 4 NOT DETECTED NOT DETECTED Final   Respiratory Syncytial Virus NOT DETECTED NOT DETECTED Final   Bordetella pertussis NOT DETECTED NOT DETECTED Final   Chlamydophila pneumoniae NOT DETECTED NOT DETECTED Final   Mycoplasma pneumoniae NOT DETECTED NOT DETECTED Final  Culture, Urine     Status: None   Collection Time: 11/19/17  3:24 PM  Result Value Ref Range Status   Specimen Description URINE, CLEAN CATCH  Final   Special Requests NONE  Final   Culture   Final    NO GROWTH Performed at [facility], [address] [place], [place] [address]    Report Status 11/20/2017 FINAL  Final         Radiology Studies: No results found.      Scheduled Meds: . acetaminophen  650 mg Oral BID  . aspirin EC  81 mg Oral Daily  . atorvastatin  20 mg Oral QHS  . benzonatate  100 mg Oral BID  . cefdinir  300 mg Oral Daily  . cholecalciferol  2,000 Units Oral Daily  . darifenacin  7.5 mg Oral Daily  . donepezil  10 mg Oral Daily  . enoxaparin (LOVENOX) injection  30 mg Subcutaneous Daily  . ipratropium-albuterol  3 mL Nebulization BID  . levothyroxine  100 mcg Oral QAC breakfast  . magnesium oxide  400 mg Oral Daily  . mouth rinse  15 mL Mouth Rinse BID  . metoprolol succinate  25 mg Oral Daily  . pantoprazole  40 mg Oral BID AC  . polyethylene glycol  17 g Oral Daily  . triamcinolone  2 spray Nasal Daily  . vitamin B-12  1,000 mcg Oral Daily   Continuous Infusions:    LOS: 5 days    Time spent: 58min    Domenic Polite, MD [facility] Page via [email or web address], password [email or web address] After 7PM please contact night-coverage  11/21/2017, 1:49 PM

## 2017-11-21 NOTE — Progress Notes (Signed)
Occupational Therapy Treatment Patient Details Name: Allison Page MRN: [id number] DOB: 1922/02/13 Today's Date: 11/21/2017    History of present illness Pt is a 84 y/o female admitted from ALF secondary to hypoxemia. Imaging revealed PNA vs. atelectasis. PMH includes dementia, HTN, TIA, DM, and anemia.    OT comments  Pt progressing towards established OT goals. Pt performing bed mobility and functional transfers with Mod A +2. Pt continues to present with poor balance and strength. Pt requiring Max A +2 for toilet hygiene and Min Guard A for grooming while seated. Continue to recommend dc to SNF and will continue to follow acutely as admitted.    Follow Up Recommendations  SNF;Supervision/Assistance - 24 hour    Equipment Recommendations  None recommended by OT    Recommendations for Other Services      Precautions / Restrictions Precautions Precautions: Fall Restrictions Weight Bearing Restrictions: No       Mobility Bed Mobility Overal bed mobility: Needs Assistance Bed Mobility: Supine to Sit     Supine to sit: +2 for physical assistance;Mod assist     General bed mobility comments: pt initiated advancing LEs to EOB and raising trunk, required mod A to raise trunk and scoot to edge of bed  Transfers Overall transfer level: Needs assistance Equipment used: Rolling walker (2 wheeled) Transfers: Stand Pivot Transfers;Sit to/from Stand Sit to Stand: Mod assist;+2 physical assistance;+2 safety/equipment Stand pivot transfers: Mod assist;+2 physical assistance;+2 safety/equipment       General transfer comment: assist to rise and steady, used RW to take a few pivotal steps to 3 in 1 then to recliner    Balance Overall balance assessment: Needs assistance Sitting-balance support: Bilateral upper extremity supported;Feet supported Sitting balance-Leahy Scale: Fair Sitting balance - Comments: required max to total assist for sitting balance due to increased  posterior lean Postural control: Posterior lean Standing balance support: Bilateral upper extremity supported Standing balance-Leahy Scale: Poor Standing balance comment: flexed posture in standing, relies on BUE support                           ADL either performed or assessed with clinical judgement   ADL Overall ADL's : Needs assistance/impaired     Grooming: Min guard;Sitting;Wash/dry face;Wash/dry Nurse, mental health Details (indicate cue type and reason): Pt washing her face and hands while seated with Min Guard A for safety                 Toilet Transfer: Moderate assistance;+2 for safety/equipment;Stand-pivot;BSC;RW Toilet Transfer Details (indicate cue type and reason): Mod A +2 for stand pivot to [facility]. Poor balance  Toileting- Clothing Manipulation and Hygiene: Maximal assistance;+2 for physical assistance Toileting - Clothing Manipulation Details (indicate cue type and reason): Max A for toilet hygiene with second person for balance     Functional mobility during ADLs: Moderate assistance;+2 for physical assistance;+2 for safety/equipment;Rolling walker General ADL Comments: Pt performing stand pivot to [facility] for toileting. Progressing towards goals. continues to present with poor balance and strength     Vision   Vision Assessment?: Vision impaired- to be further tested in functional context   Perception     Praxis      Cognition Arousal/Alertness: Awake/alert Behavior During Therapy: WFL for tasks assessed/performed Overall Cognitive Status: History of cognitive impairments - at baseline  General Comments: dementia at baseline, oriented to self and knows she's in hospital, but not oriented to reason for hospitalization, follows 1 step commands 75% of time        Exercises     Shoulder Instructions       General Comments 3L O2    Pertinent Vitals/ Pain       Pain Assessment: Faces Faces Pain  Scale: No hurt Pain Location: R lower quadrant, R shoulder with movement Pain Descriptors / Indicators: Grimacing;Guarding Pain Intervention(s): Monitored during session;Repositioned  Home Living                                          Prior Functioning/Environment              Frequency  Min 2X/week        Progress Toward Goals  OT Goals(current goals can now be found in the care plan section)  Progress towards OT goals: Progressing toward goals  Acute Rehab OT Goals Patient Stated Goal: none stated Time For Goal Achievement: 12/02/17 Potential to Achieve Goals: Fair ADL Goals Pt Will Perform Grooming: with min assist Pt Will Perform Upper Body Bathing: with min assist Pt Will Perform Lower Body Bathing: with mod assist Pt Will Perform Upper Body Dressing: with min assist Pt Will Perform Lower Body Dressing: with mod assist  Plan Discharge plan remains appropriate    Co-evaluation    PT/OT/SLP Co-Evaluation/Treatment: Yes Reason for Co-Treatment: Complexity of the patient's impairments (multi-system involvement) PT goals addressed during session: Mobility/safety with mobility;Balance;Proper use of DME OT goals addressed during session: ADL's and self-care      AM-PAC PT "6 Clicks" Daily Activity     Outcome Measure   Help from another person eating meals?: Total Help from another person taking care of personal grooming?: A Little Help from another person toileting, which includes using toliet, bedpan, or urinal?: A Lot Help from another person bathing (including washing, rinsing, drying)?: Total Help from another person to put on and taking off regular upper body clothing?: Total Help from another person to put on and taking off regular lower body clothing?: Total 6 Click Score: 9    End of Session Equipment Utilized During Treatment: Oxygen;Gait belt;Rolling walker(3L)  OT Visit Diagnosis: Unsteadiness on feet (R26.81);Pain;Other  symptoms and signs involving cognitive function Pain - part of body: (back)   Activity Tolerance Patient limited by pain;Patient limited by fatigue   Patient Left with call bell/phone within reach;in chair;with chair alarm set   Nurse Communication Mobility status        Time: [id number] OT Time Calculation (min): 27 min  Charges: OT General Charges $OT Visit: 1 Visit OT Treatments $Self Care/Home Management : 8-22 mins  [place], OTR/L Acute Rehab Pager: [phone] Office: [place] 11/21/2017, 12:32 PM

## 2017-11-21 NOTE — Progress Notes (Signed)
Physical Therapy Treatment Patient Details Name: Allison Page MRN: [id number] DOB: 03-Dec-1922 Today's Date: 11/21/2017    History of Present Illness Pt is a 84 y/o female admitted from ALF secondary to hypoxemia. Imaging revealed PNA vs. atelectasis. PMH includes dementia, HTN, TIA, DM, and anemia.     PT Comments    Improved activity tolerance today. Mod assist for bed mobility and to transfer to bedside commode then to recliner. Pt oriented to self but not to situation/date. 24* assist recommended.   Follow Up Recommendations  SNF;Supervision/Assistance - 24 hour     Equipment Recommendations  None recommended by PT    Recommendations for Other Services       Precautions / Restrictions Precautions Precautions: Fall Restrictions Weight Bearing Restrictions: No    Mobility  Bed Mobility Overal bed mobility: Needs Assistance Bed Mobility: Supine to Sit     Supine to sit: +2 for physical assistance;Mod assist     General bed mobility comments: pt initiated advancing LEs to EOB and raising trunk, required mod A to raise trunk and scoot to edge of bed  Transfers Overall transfer level: Needs assistance Equipment used: Rolling walker (2 wheeled) Transfers: Stand Pivot Transfers;Sit to/from Stand Sit to Stand: Mod assist;+2 physical assistance;+2 safety/equipment Stand pivot transfers: Mod assist;+2 physical assistance;+2 safety/equipment       General transfer comment: assist to rise and steady, used RW to take a few pivotal steps to 3 in 1 then to recliner  Ambulation/Gait                 Stairs             Wheelchair Mobility    Modified Rankin (Stroke Patients Only)       Balance Overall balance assessment: Needs assistance Sitting-balance support: Bilateral upper extremity supported;Feet supported Sitting balance-Leahy Scale: Fair     Standing balance support: Bilateral upper extremity supported Standing balance-Leahy Scale:  Poor Standing balance comment: flexed posture in standing, relies on BUE support                            Cognition Arousal/Alertness: Awake/alert Behavior During Therapy: WFL for tasks assessed/performed Overall Cognitive Status: History of cognitive impairments - at baseline                                 General Comments: dementia at baseline, oriented to self and knows she's in hospital, but not oriented to reason for hospitalization, follows 1 step commands 75% of time      Exercises      General Comments        Pertinent Vitals/Pain Faces Pain Scale: Hurts little more Pain Location: R lower quadrant, R shoulder with movement Pain Descriptors / Indicators: Grimacing;Guarding Pain Intervention(s): Limited activity within patient's tolerance;Monitored during session;Repositioned;Premedicated before session    Home Living                      Prior Function            PT Goals (current goals can now be found in the care plan section) Acute Rehab PT Goals Patient Stated Goal: none stated PT Goal Formulation: Patient unable to participate in goal setting Time For Goal Achievement: 11/06/2017 Potential to Achieve Goals: Fair Progress towards PT goals: Progressing toward goals    Frequency    Min 2X/week  PT Plan Current plan remains appropriate    Co-evaluation PT/OT/SLP Co-Evaluation/Treatment: Yes Reason for Co-Treatment: Complexity of the patient's impairments (multi-system involvement);For patient/therapist safety PT goals addressed during session: Mobility/safety with mobility;Balance;Proper use of DME        AM-PAC PT "6 Clicks" Daily Activity  Outcome Measure  Difficulty turning over in bed (including adjusting bedclothes, sheets and blankets)?: A Lot Difficulty moving from lying on back to sitting on the side of the bed? : A Lot Difficulty sitting down on and standing up from a chair with arms (e.g.,  wheelchair, bedside commode, etc,.)?: A Lot Help needed moving to and from a bed to chair (including a wheelchair)?: A Lot Help needed walking in hospital room?: Total Help needed climbing 3-5 steps with a railing? : Total 6 Click Score: 10    End of Session Equipment Utilized During Treatment: Gait belt Activity Tolerance: Patient limited by pain Patient left: with call bell/phone within reach;in chair;with chair alarm set Nurse Communication: Mobility status PT Visit Diagnosis: Unsteadiness on feet (R26.81);Other abnormalities of gait and mobility (R26.89);Muscle weakness (generalized) (M62.81);History of falling (Z91.81);Pain Pain - part of body: (back)     Time: 1505-6979 PT Time Calculation (min) (ACUTE ONLY): 31 min  Charges:  $Therapeutic Activity: 8-22 mins                     Blondell Reveal Kistler PT 11/21/2017  Acute Rehabilitation Services Pager [phone] Office [phone]

## 2017-11-21 NOTE — Care Management Important Message (Signed)
Important Message  Patient Details  Name: Allison Page MRN: [id number] Date of Birth: 1923-01-09   Medicare Important Message Given:  Yes    Zenon Mayo, RN 11/21/2017, 11:50 AM

## 2017-11-21 NOTE — Progress Notes (Signed)
4:45pm- Patient's daughter contacted CSW back and requested that patient be discharged to [facility]. She spoke with their staff and believes patient will be better served there.   2pm-CSW spoke with patient's daughter regarding change in discharge plan to SNF. She requested a list be emailed to her.  Percell Locus Alin Hutchins LCSW [phone]

## 2017-11-21 NOTE — Progress Notes (Signed)
  Speech Language Pathology Treatment: Dysphagia  Patient Details Name: Allison Page MRN: [id number] DOB: 1922/10/25 Today's Date: 11/21/2017 Time: 3267-1245 SLP Time Calculation (min) (ACUTE ONLY): 10 min  Assessment / Plan / Recommendation Clinical Impression  Pt required Max faded to Mod cues for alertness and sustained attention to PO trials, as she was sleeping upon SLP arrival. Oral phase is mildly prolonged with soft solids; frequent eructation is noted but without any overt coughing. If her mentation has returned to baseline, as notes suggest, then pt can likely return to baseline diet upon return to ALF (unclear what texture this may be). For now, she appears safe to continue with Dys 2 diet and thin liquids. Will f/u briefly.   HPI HPI: Pt is a 84 y.o. female with medical history significant of acid reflux, osteoarthritis, dementia, type 2 diabetes mellitus, hypertension, dyslipidemia, hypothyroidism, iron deficiency anemia, history of transitory ischemic attacks, chronic kidney disease stage III, and anemia of chronic disease. She is admitted with worsening dyspnea and cough, AMS. CT Head negative; CXR showed lower lung volumes.      SLP Plan  Continue with current plan of care       Recommendations  Diet recommendations: Dysphagia 2 (fine chop);Thin liquid Liquids provided via: Cup;Straw Medication Administration: Whole meds with puree Supervision: Staff to assist with self feeding;Full supervision/cueing for compensatory strategies Compensations: Slow rate;Small sips/bites;Minimize environmental distractions Postural Changes and/or Swallow Maneuvers: Seated upright 90 degrees                Oral Care Recommendations: Oral care BID Follow up Recommendations: 24 hour supervision/assistance SLP Visit Diagnosis: Dysphagia, unspecified (R13.10) Plan: Continue with current plan of care       GO                Allison Page 11/21/2017, 11:29 AM  Allison Page, M.A. [place] Acute Environmental education officer [phone] Office [phone]

## 2017-11-21 NOTE — Care Management Important Message (Signed)
Important Message  Patient Details  Name: Allison Page MRN: [id number] Date of Birth: [birth year]   Medicare Important Message Given:  Yes    [place] Jr 11/21/2017, 3:02 PM

## 2017-11-21 NOTE — Progress Notes (Signed)
Pharmacy Antibiotic Note  Allison Page is a 84 y.o. female admitted on 11/16/2017 with pneumonia and UTI.  Pharmacy has been consulted for Cefepime dosing. Pt was previously on ceftriaxone + azithro and not improving. Will broaden coverage to cefepime + azithro. Pt is CKD III Scr 2.30 trending up (b/l 2), afebrile, WBC WNL. Pt has PCN allergy but has been tolerating cephalosporins.   D/w on rounds today. Ok to de-escalate to cefdinir to complete a total of 7d.   Plan: Dc cefepime/azith Cefdinir 300mg  PO q24 x4d   Height: 5\' 3"  (160 cm) Weight: 104 lb 11.5 oz (47.5 kg) IBW/kg (Calculated) : 52.4  Temp (24hrs), Avg:98.7 F (37.1 C), Min:98.4 F (36.9 C), Max:99.2 F (37.3 C)  Recent Labs  Lab 11/16/17 1952 11/17/17 0305 11/17/17 1522 11/18/17 0628 11/19/17 0224 11/20/17 0215 11/21/17 0333  WBC  --  4.8 5.6 5.0 3.6* 4.0 4.0  CREATININE  --  2.01*  --  1.71* 2.21* 2.30* 1.82*  LATICACIDVEN 0.99  --   --   --   --   --   --     Estimated Creatinine Clearance: 14.5 mL/min (A) (by C-G formula based on SCr of 1.82 mg/dL (H)).    Allergies  Allergen Reactions  . Allegra Allergy [Fexofenadine Hcl] Anaphylaxis  . Amoxicillin   . Penicillins   . Sulfa Antibiotics     Antimicrobials this admission: levofloxacin 10/17 >> 10/17 doxycycline 10/16 >> 10/17 Ceftriaxone 10/17>>10/20 Azithromycin 10/17>>10/21 Cefepime 10/20>>10/21 Cefdinir 10/21>10/24  Dose adjustments this admission:  Microbiology results: 10/16 BCx: NGTD 10/19 UCx:  neg 10/17 Resp: negative  Onnie Boer, PharmD, BCIDP, AAHIVP, CPP Infectious Disease Pharmacist Pager: [phone] 11/21/2017 12:07 PM

## 2017-11-22 LAB — CBC
HCT: 29 % — ABNORMAL LOW (ref 36.0–46.0)
HEMOGLOBIN: 8.8 g/dL — AB (ref 12.0–15.0)
MCH: 31.1 pg (ref 26.0–34.0)
MCHC: 30.3 g/dL (ref 30.0–36.0)
MCV: 102.5 fL — ABNORMAL HIGH (ref 80.0–100.0)
PLATELETS: 496 10*3/uL — AB (ref 150–400)
RBC: 2.83 MIL/uL — AB (ref 3.87–5.11)
RDW: 21 % — ABNORMAL HIGH (ref 11.5–15.5)
WBC: 5.3 10*3/uL (ref 4.0–10.5)
nRBC: 0.6 % — ABNORMAL HIGH (ref 0.0–0.2)

## 2017-11-22 LAB — BASIC METABOLIC PANEL
ANION GAP: 5 (ref 5–15)
BUN: 31 mg/dL — ABNORMAL HIGH (ref 8–23)
CO2: 25 mmol/L (ref 22–32)
Calcium: 13.4 mg/dL (ref 8.9–10.3)
Chloride: 107 mmol/L (ref 98–111)
Creatinine, Ser: 1.79 mg/dL — ABNORMAL HIGH (ref 0.44–1.00)
GFR calc Af Amer: 27 mL/min — ABNORMAL LOW (ref >60)
GFR, EST NON AFRICAN AMERICAN: 23 mL/min — AB (ref >60)
Glucose, Bld: 121 mg/dL — ABNORMAL HIGH (ref 70–99)
POTASSIUM: 4.1 mmol/L (ref 3.5–5.1)
SODIUM: 137 mmol/L (ref 135–145)

## 2017-11-22 MED ORDER — HYDROCODONE-ACETAMINOPHEN 5-325 MG PO TABS: 1.0000 | ORAL_TABLET | Freq: Four times a day (QID) | ORAL | 0 refills | Status: AC | PRN

## 2017-11-22 MED ORDER — CEFDINIR 300 MG PO CAPS: 300.0000 mg | ORAL_CAPSULE | Freq: Every day | ORAL | 0 refills | Status: DC

## 2017-11-22 MED ORDER — CALCITONIN (SALMON) 200 UNIT/ML IJ SOLN
4.0000 [IU]/kg | Freq: Once | INTRAMUSCULAR | Status: AC
  Administered 2017-11-22: 190 [IU] via INTRAMUSCULAR
  Filled 2017-11-22: qty 0.95

## 2017-11-22 MED ORDER — BENZONATATE 100 MG PO CAPS: 100.0000 mg | ORAL_CAPSULE | Freq: Two times a day (BID) | ORAL | 0 refills | Status: DC

## 2017-11-22 MED ORDER — HYDRALAZINE HCL 10 MG PO TABS: 10.0000 mg | ORAL_TABLET | Freq: Three times a day (TID) | ORAL | Status: DC

## 2017-11-22 MED ORDER — ZOLEDRONIC ACID 4 MG/5ML IV CONC
4.0000 mg | Freq: Once | INTRAVENOUS | Status: AC
  Administered 2017-11-22: 4 mg via INTRAVENOUS
  Filled 2017-11-22: qty 5

## 2017-11-22 MED ORDER — CALCITONIN (SALMON) 200 UNIT/ACT NA SOLN: 1.0000 | Freq: Every day | NASAL | Status: DC

## 2017-11-22 NOTE — Discharge Summary (Deleted)
Physician Discharge Summary  Allison Page Allison Page  PCP: Jacelyn Pi, NP  Admit date: Page Discharge date: 11/22/2017  Time spent: 35 minutes  Recommendations for Outpatient Follow-up:  1. PCP in 1 week 2. Please consider Palliative care evaluation at SNF   Discharge Diagnoses:    Community acquired pneumonia   Urinary retention   Possible UTI   Chronic kidney disease stage 3   DNR   Iron deficiency anemia   Anemia of chronic renal failure, stage 3 (moderate) ([place])   DM type 2 (diabetes mellitus, type 2) ([place])   Essential hypertension, benign   Hypoxemia   Hypothyroidism   Osteoarthritis./Chronic pain   Discharge Condition: stable  Diet recommendation: dysphagia 2 diet with thin liquids  Filed Weights   11/16/17 1855 11/16/17 2314  Weight: 51.2 kg 47.5 kg    History of present illness:  Allison Bowler Wrightsonis a 84 y.o.femalewith medical history significant ofosteoarthritis,dementia, type 2 diabetes mellitus, hypertension, dyslipidemia, hypothyroidism, iron deficiency anemia, history of transitory ischemic attacks, chronic kidney disease stage III, andanemia of chronic disease. About 7 days ago she was diagnosed with bronchitis, she had aninhaler/bronchodilator andacough suppressive agent prescribed. Her symptoms continued to deteriorate, consistent with productive cough, congestion, and wheezing. On the day of admission she had severe dyspnea  Hospital Course:   Acute hypoxic respiratory failure -admitted with productive cough, congestion, wheezing and hypoxia due to pneumonia +/- bronchitis -Repeat chest x-ray noted bibasilar atelectasis -Was on IV ceftriaxone and azithromycin, this was changed to cefepime and azithromycin due to UTI and lethargy while on ceftriaxone -Improving and stable now, transitioned to oral Omnicef -Continue pulmonary toilet, nebulizations -Mucinex, Tessalon Perles, wean O2 -SLP  evaluation: Mild aspiration risk suspected, started dysphagia 2 diet with thin liquids -Discharge planning, SNF for Rehab  UTI/urinary retention -On 10/19 developed acute urinary retention with lethargy, urinalysis was highly suggestive of urinary tract infection, Foley catheter had to be placed for retention -Antibiotics broadened to cefepime -Urine Cultures came back negative -Abx changed  to oral Omnicef -Discontinued Foley catheter -not having further issues with retention  Chronic anemia -Due to chronic kidney disease and iron deficiency -transfused 1 unit PRBC on admission -Anemia panel suggestive of chronic disease -Hb is stable since  Acute metabolic encephalopathy -In the setting of infection, chronic dementia -Caution with sedating meds benzos etc. -improved  Acute kidney injury on chronic kidney disease stage III -Slightly worsening of creatinine to 2.4 -hydrated with gentle IV fluids, creatinine improved and back to baseline  Hypothyroidism -Continue Synthroid  Hypertension -Continue metoprolol  Osteoarthritis/chronic pain -Continue home regimen of oxycodone PRN  Discharge Exam: Vitals:   11/22/17 0831 11/22/17 0840  BP: (!) 151/49   Pulse: 63   Resp: 20   Temp: 99.4 F (37.4 C)   SpO2: 96% 96%    General: AAOx3 Cardiovascular: S1S2/RRR Respiratory: CTAB  Discharge Instructions   Discharge Instructions    Diet - low sodium heart healthy   Complete by:  As directed    Increase activity slowly   Complete by:  As directed      Allergies as of 11/22/2017      Reactions   Allegra Allergy [fexofenadine Hcl] Anaphylaxis   Amoxicillin    Penicillins    Sulfa Antibiotics       Medication List    STOP taking these medications   Acetaminophen-Codeine 300-30 MG tablet   conjugated estrogens vaginal cream Commonly known as:  PREMARIN   LORazepam  0.5 MG tablet Commonly known as:  ATIVAN   oseltamivir 75 MG capsule Commonly known as:   TAMIFLU   predniSONE 10 MG tablet Commonly known as:  DELTASONE   traMADol 50 MG tablet Commonly known as:  ULTRAM   triamcinolone cream 0.1 % Commonly known as:  KENALOG     TAKE these medications   acetaminophen 325 MG tablet Commonly known as:  TYLENOL Take 650 mg by mouth 2 (two) times daily. What changed:  Another medication with the same name was removed. Continue taking this medication, and follow the directions you see here.   albuterol 108 (90 Base) MCG/ACT inhaler Commonly known as:  PROVENTIL HFA;VENTOLIN HFA Inhale 2 puffs into the lungs 4 (four) times daily.   amLODipine 10 MG tablet Commonly known as:  NORVASC TAKE (1/2) TABLET BY MOUTH ONCE DAILY. What changed:  See the new instructions.   aspirin 81 MG tablet TAKE ONE TABLET BY MOUTH ONCE DAILY.   atorvastatin 20 MG tablet Commonly known as:  LIPITOR TAKE (1) TABLET BY MOUTH AT BEDTIME. What changed:  See the new instructions.   benzonatate 100 MG capsule Commonly known as:  TESSALON Take 1 capsule (100 mg total) by mouth 2 (two) times daily for 5 days.   BLOOD GLUCOSE TEST STRIPS Strp Use as directed once weekly E11.9   cefdinir 300 MG capsule Commonly known as:  OMNICEF Take 1 capsule (300 mg total) by mouth daily for 3 days. Start taking on:  11/23/2017   darifenacin 7.5 MG 24 hr tablet Commonly known as:  ENABLEX Take 1 tablet (7.5 mg total) by mouth daily.   docusate 50 MG/5ML liquid Commonly known as:  COLACE Take 100 mg by mouth daily.   donepezil 10 MG tablet Commonly known as:  ARICEPT Take 1 tablet (10 mg total) by mouth daily.   fexofenadine 180 MG tablet Commonly known as:  ALLEGRA Take 180 mg by mouth daily.   guaiFENesin 600 MG 12 hr tablet Commonly known as:  MUCINEX Take 1 tablet (600 mg total) by mouth 2 (two) times daily as needed. What changed:    reasons to take this  Another medication with the same name was removed. Continue taking this medication, and follow  the directions you see here.   hydrALAZINE 10 MG tablet Commonly known as:  APRESOLINE Take 1 tablet (10 mg total) by mouth 3 (three) times daily. What changed:    medication strength  how much to take  Another medication with the same name was removed. Continue taking this medication, and follow the directions you see here.   HYDROcodone-acetaminophen 5-325 MG tablet Commonly known as:  NORCO/VICODIN Take 1 tablet by mouth every 6 (six) hours as needed for moderate pain. What changed:    when to take this  reasons to take this   levothyroxine 100 MCG tablet Commonly known as:  SYNTHROID, LEVOTHROID TAKE ONE TABLET BY MOUTH ONCE DAILY.   magnesium oxide 400 MG tablet Commonly known as:  MAG-OX Take 400 mg by mouth daily.   Melatonin 3 MG Subl Place 3 mg under the tongue at bedtime.   metoprolol succinate 50 MG 24 hr tablet Commonly known as:  TOPROL-XL Take 1 tablet (50 mg total) by mouth daily. Take with or immediately following a meal. What changed:  how much to take   mupirocin ointment 2 % Commonly known as:  BACTROBAN Apply 1 application topically 3 (three) times daily.   NASACORT ALLERGY 24HR 55 MCG/ACT Aero nasal  inhaler Generic drug:  triamcinolone SPRAY 2 SPRAYS INTO EACH NOSTRIL DAILY. What changed:  See the new instructions.   pantoprazole 40 MG tablet Commonly known as:  PROTONIX TAKE (1) TABLET BY MOUTH TWICE DAILY 30-60 MINUTES BEFORE BREAKFAST AND DINNER. What changed:  See the new instructions.   polyethylene glycol packet Commonly known as:  MIRALAX / GLYCOLAX Take 17 g by mouth daily.   GAVILAX powder Generic drug:  polyethylene glycol powder MIX 1 CAPFUL (17G) IN 8 OUNCES OF JUICE/WATER AND DRINK ONCE DAILY.   senna-docusate 8.6-50 MG tablet Commonly known as:  Senokot-S Take 1-2 tablets, IF NEEDED, every 12 hrs for constipation What changed:    how much to take  how to take this  when to take this  reasons to take  this  additional instructions   trolamine salicylate 10 % cream Commonly known as:  ASPERCREME Apply 1 application topically 3 (three) times daily.   vitamin B-12 1000 MCG tablet Commonly known as:  CYANOCOBALAMIN TAKE ONE TABLET BY MOUTH ONCE DAILY.   Vitamin D 2000 units tablet TAKE (1) TABLET BY MOUTH ONCE DAILY. What changed:  See the new instructions.      Allergies  Allergen Reactions  . Allegra Allergy [Fexofenadine Hcl] Anaphylaxis  . Amoxicillin   . Penicillins   . Sulfa Antibiotics    Follow-up Information    Benedict Needy Valere Dross, NP. Schedule an appointment as soon as possible for a visit in 1 week(s).   Specialty:  Nurse Practitioner Contact information: [address] [place] [address] [phone]            The results of significant diagnostics from this hospitalization (including imaging, microbiology, ancillary and laboratory) are listed below for reference.    Significant Diagnostic Studies: Dg Chest 1 View  Result Date: 11/17/2017 CLINICAL DATA:  Shortness of breath.  Cough.  Congestion. EXAM: CHEST  1 VIEW COMPARISON:  Page. FINDINGS: Stable mild cardiomegaly with normal pulmonary vascular mild bibasilar atelectasis/scarring. No pleural effusion or pneumothorax. Degenerative change thoracic spine with thoracic spine scoliosis. Stable lower thoracic vertebral body compression fractures. Degenerative changes thoracic spine. Degenerative changes both shoulders. No acute bony abnormality identified. Surgical clips left chest. IMPRESSION: 1.  Stable mild cardiomegaly. 2.  Mild bibasilar atelectasis and/or scarring. Electronically Signed   By: Marcello Moores  Register   On: 11/17/2017 09:42   Dg Chest 2 View  Result Date: Page CLINICAL DATA:  84 year old female with generalized weakness and dysphagia. EXAM: CHEST - 2 VIEW COMPARISON:  Chest radiographs 09/18/2017 and earlier. FINDINGS: Calcified aortic atherosclerosis. Stable cardiac  size at the upper limits of normal to mildly increased. Mildly lower lung volumes. No pneumothorax, pulmonary edema, pleural effusion or confluent pulmonary opacity. Osteopenia. No acute osseous abnormality identified. Negative visible bowel gas pattern. IMPRESSION: 1. Lower lung volumes, otherwise no acute cardiopulmonary abnormality. 2.  Aortic Atherosclerosis (ICD10-I70.0). Electronically Signed   By: Genevie Ann M.D.   On: Page 21:20   Ct Head Wo Contrast  Result Date: Page CLINICAL DATA:  84 year old female with weakness and dysphagia. EXAM: CT HEAD WITHOUT CONTRAST TECHNIQUE: Contiguous axial images were obtained from the base of the skull through the vertex without intravenous contrast. COMPARISON:  09/18/2017 head CT and earlier. FINDINGS: Brain: Stable cerebral volume. Stable gray-white matter differentiation throughout the brain. No midline shift, ventriculomegaly, mass effect, evidence of mass lesion, intracranial hemorrhage or evidence of cortically based acute infarction. No cortical encephalomalacia identified. Vascular: Calcified atherosclerosis at the skull  base. Skull: No suspicious intracranial vascular hyperdensity. Sinuses/Orbits: Visualized paranasal sinuses and mastoids are stable and well pneumatized. Other: Stable orbit and scalp soft tissues. IMPRESSION: No acute findings.  Stable non contrast CT appearance of the brain. Electronically Signed   By: Genevie Ann M.D.   On: Page 21:09    Microbiology: Recent Results (from the past 240 hour(s))  Blood culture (routine x 2)     Status: None   Collection Time: 11/16/17  7:35 PM  Result Value Ref Range Status   Specimen Description BLOOD RIGHT ANTECUBITAL  Final   Special Requests   Final    BOTTLES DRAWN AEROBIC AND ANAEROBIC Blood Culture adequate volume   Culture   Final    NO GROWTH 5 DAYS Performed at [facility], [address], [place], [place] [address]    Report Status 11/21/2017 FINAL  Final  Blood  culture (routine x 2)     Status: None   Collection Time: 11/16/17  7:42 PM  Result Value Ref Range Status   Specimen Description BLOOD RIGHT HAND  Final   Special Requests   Final    BOTTLES DRAWN AEROBIC AND ANAEROBIC Blood Culture results may not be optimal due to an inadequate volume of blood received in culture bottles   Culture   Final    NO GROWTH 5 DAYS Performed at [facility], [place] [address], [place], [place] [address]    Report Status 11/21/2017 FINAL  Final  Respiratory Panel by PCR     Status: None   Collection Time: 11/17/17  5:57 AM  Result Value Ref Range Status   Adenovirus NOT DETECTED NOT DETECTED Final   Coronavirus 229E NOT DETECTED NOT DETECTED Final   Coronavirus HKU1 NOT DETECTED NOT DETECTED Final   Coronavirus NL63 NOT DETECTED NOT DETECTED Final   Coronavirus OC43 NOT DETECTED NOT DETECTED Final   Metapneumovirus NOT DETECTED NOT DETECTED Final   Rhinovirus / Enterovirus NOT DETECTED NOT DETECTED Final   Influenza A NOT DETECTED NOT DETECTED Final   Influenza B NOT DETECTED NOT DETECTED Final   Parainfluenza Virus 1 NOT DETECTED NOT DETECTED Final   Parainfluenza Virus 2 NOT DETECTED NOT DETECTED Final   Parainfluenza Virus 3 NOT DETECTED NOT DETECTED Final   Parainfluenza Virus 4 NOT DETECTED NOT DETECTED Final   Respiratory Syncytial Virus NOT DETECTED NOT DETECTED Final   Bordetella pertussis NOT DETECTED NOT DETECTED Final   Chlamydophila pneumoniae NOT DETECTED NOT DETECTED Final   Mycoplasma pneumoniae NOT DETECTED NOT DETECTED Final  Culture, Urine     Status: None   Collection Time: 11/19/17  3:24 PM  Result Value Ref Range Status   Specimen Description URINE, CLEAN CATCH  Final   Special Requests NONE  Final   Culture   Final    NO GROWTH Performed at [facility], [address] [place], [place] [address]    Report Status 11/20/2017 FINAL  Final     Labs: Basic Metabolic Panel: Recent Labs  Lab 11/18/17 0628  11/19/17 0224 11/20/17 0215 11/21/17 0333 11/22/17 0822  NA 137 140 139 138 137  K 3.3* 4.0 4.3 4.0 4.1  CL 104 110 110 109 107  CO2 22 25 25 26 25   GLUCOSE 154* 98 126* 114* 121*  BUN 25* 34* 44* 36* 31*  CREATININE 1.71* 2.21* 2.30* 1.82* 1.79*  CALCIUM 12.9* 12.9* 13.0* 12.7* 13.4*   Liver Function Tests: Recent Labs  Lab 11/15/17 1333  AST 21  ALT 16  ALKPHOS 104*  BILITOT 0.4  PROT 7.1  ALBUMIN 3.3*   No results for input(s): LIPASE, AMYLASE in the last 168 hours. No results for input(s): AMMONIA in the last 168 hours. CBC: Recent Labs  Lab 11/15/17 1333  11/18/17 0628 11/19/17 0224 11/20/17 0215 11/21/17 0333 11/22/17 0822  WBC 5.2   < > 5.0 3.6* 4.0 4.0 5.3  NEUTROABS 3.3  --   --   --   --   --   --   HGB 7.9*   < > 10.4* 9.3* 8.6* 8.6* 8.8*  HCT 26.1*   < > 32.3* 31.2* 28.6* 28.2* 29.0*  MCV 110.6*   < > 96.1 99.7 100.7* 100.7* 102.5*  PLT 375   < > 479* 452* 467* 465* 496*   < > = values in this interval not displayed.   Cardiac Enzymes: No results for input(s): CKTOTAL, CKMB, CKMBINDEX, TROPONINI in the last 168 hours. BNP: BNP (last 3 results) No results for input(s): BNP in the last 8760 hours.  ProBNP (last 3 results) No results for input(s): PROBNP in the last 8760 hours.  CBG: Recent Labs  Lab 11/16/17 1928 11/19/17 0743  GLUCAP 132* 100*       Signed:  Domenic Polite MD.  [facility] 11/22/2017, 10:05 AM

## 2017-11-22 NOTE — Progress Notes (Addendum)
PROGRESS NOTE    Allison Page  WRU:[id number] DOB: 06/15/1922 DOA: 11/16/2017 PCP: Jacelyn Pi, NP  Brief Narrative: Allison Page is a 84 y.o. female with medical history significant of osteoarthritis, dementia, type 2 diabetes mellitus, hypertension, dyslipidemia, hypothyroidism, iron deficiency anemia, history of transitory ischemic attacks, chronic kidney disease stage III, and anemia of chronic disease.  About 7 days ago she was diagnosed with bronchitis, she had an inhaler/bronchodilator and a cough suppressive agent prescribed.  Her symptoms continued to deteriorate, consistent with productive cough, congestion, and wheezing.  On the day of admission she had severe dyspnea. -in ED she was hypoxic, chest x-ray showed pneumonia versus atelectasis -more drowsy today  Assessment & Plan:   Acute hypoxic respiratory failure -Due to pneumonia +/- bronchitis -Repeat chest x-ray with bibasilar atelectasis -Was on IV ceftriaxone and azithromycin, this was changed to cefepime and azithromycin due to UTI and lethargy while on ceftriaxone -Improving and stable, will transition to oral Omnicef -Continue pulmonary toilet, nebulizations -Mucinex, Tessalon Perles, wean O2 -SLP evaluation: Mild aspiration risk suspected, started dysphagia 2 diet with thin liquids -Discharge planning, SNF tomorrow  UTI/urinary retention -10/19 developed acute urinary retention with lethargy, urinalysis was highly suggestive of urinary tract infection, Foley catheter had to be placed for retention -Antibiotics broadened to cefepime -Cultures negative -Addition to oral Omnicef -Discontinue Foley catheter  Chronic anemia -Due to chronic kidney disease and iron deficiency -transfused 1 unit PRBC on admission -Anemia panel suggestive of chronic disease -Hb is stable  Acute metabolic encephalopathy -In the setting of infection, chronic dementia -Caution with sedating meds benzos  etc. -improved  Acute kidney injury on chronic kidney disease stage III -Slightly worsening of creatinine to 2.4 -hydrated with gentle IV fluids, creatinine improved and back to baseline  Hypothyroidism -Continue Synthroid  Hypertension -Continue metoprolol  Osteoarthritis/chronic pain -Continue home regimen of oxycodone PRN  Hypercalcemia -ongoing for 1 year with slight worsening -Ca in 12 range, was upto 13.4,  2-3months ago -PTH was 264 59months ago c/w Primary hyperparathyroidism -CA worsened from 12 to 13.4 today, will give Zometa x1 and calcitonin, will take a few days to improve  DVT prophylaxis: Lovenox Code Status: DNR Family Communication: No family at bedside, called and d/w daughter Allison Page this weekend Disposition Plan: SNF in 1-2days  Consultants:      Procedures:   Antimicrobials:    Subjective: -More lucid and interactive today -Had Foley catheter placed due to urinary retention and development of UTI  Objective: Vitals:   11/21/17 1613 11/21/17 2354 11/22/17 0831 11/22/17 0840  BP: (!) 135/59 (!) 165/48 (!) 151/49   Pulse: 69 65 63   Resp: 16 20 20    Temp: 98.6 F (37 C) 98.4 F (36.9 C) 99.4 F (37.4 C)   TempSrc: Oral Oral Oral   SpO2: 98% 96% 96% 96%  Weight:      Height:        Intake/Output Summary (Last 24 hours) at 11/22/2017 1130 Last data filed at 11/22/2017 0908 Gross per 24 hour  Intake 480 ml  Output 645 ml  Net -165 ml   Filed Weights   11/16/17 1855 11/16/17 2314  Weight: 51.2 kg 47.5 kg    Examination:  Gen: Awake, Alert, Oriented X 2, more alert and interactive this morning HEENT: PERRLA Lungs: Scattered rhonchi bilaterally CVS: S1-S2/regular rate rhythm Abd: soft, Non tender, non distended, BS present Extremities: No Cyanosis, Clubbing or edema Skin: no new rashes Psychiatry: pleasant, flat affect  Data Reviewed:   CBC: Recent Labs  Lab 11/15/17 1333  11/18/17 0628 11/19/17 0224 11/20/17 0215  11/21/17 0333 11/22/17 0822  WBC 5.2   < > 5.0 3.6* 4.0 4.0 5.3  NEUTROABS 3.3  --   --   --   --   --   --   HGB 7.9*   < > 10.4* 9.3* 8.6* 8.6* 8.8*  HCT 26.1*   < > 32.3* 31.2* 28.6* 28.2* 29.0*  MCV 110.6*   < > 96.1 99.7 100.7* 100.7* 102.5*  PLT 375   < > 479* 452* 467* 465* 496*   < > = values in this interval not displayed.   Basic Metabolic Panel: Recent Labs  Lab 11/18/17 0628 11/19/17 0224 11/20/17 0215 11/21/17 0333 11/22/17 0822  NA 137 140 139 138 137  K 3.3* 4.0 4.3 4.0 4.1  CL 104 110 110 109 107  CO2 22 25 25 26 25   GLUCOSE 154* 98 126* 114* 121*  BUN 25* 34* 44* 36* 31*  CREATININE 1.71* 2.21* 2.30* 1.82* 1.79*  CALCIUM 12.9* 12.9* 13.0* 12.7* 13.4*   GFR: Estimated Creatinine Clearance: 14.7 mL/min (A) (by C-G formula based on SCr of 1.79 mg/dL (H)). Liver Function Tests: Recent Labs  Lab 11/15/17 1333  AST 21  ALT 16  ALKPHOS 104*  BILITOT 0.4  PROT 7.1  ALBUMIN 3.3*   No results for input(s): LIPASE, AMYLASE in the last 168 hours. No results for input(s): AMMONIA in the last 168 hours. Coagulation Profile: No results for input(s): INR, PROTIME in the last 168 hours. Cardiac Enzymes: No results for input(s): CKTOTAL, CKMB, CKMBINDEX, TROPONINI in the last 168 hours. BNP (last 3 results) No results for input(s): PROBNP in the last 8760 hours. HbA1C: No results for input(s): HGBA1C in the last 72 hours. CBG: Recent Labs  Lab 11/16/17 1928 11/19/17 0743  GLUCAP 132* 100*   Lipid Profile: No results for input(s): CHOL, HDL, LDLCALC, TRIG, CHOLHDL, LDLDIRECT in the last 72 hours. Thyroid Function Tests: No results for input(s): TSH, T4TOTAL, FREET4, T3FREE, THYROIDAB in the last 72 hours. Anemia Panel: No results for input(s): VITAMINB12, FOLATE, FERRITIN, TIBC, IRON, RETICCTPCT in the last 72 hours. Urine analysis:    Component Value Date/Time   COLORURINE YELLOW 11/19/2017 1535   APPEARANCEUR CLOUDY (A) 11/19/2017 1535    APPEARANCEUR Cloudy (A) 07/05/2014 1622   LABSPEC 1.012 11/19/2017 1535   PHURINE 7.0 11/19/2017 1535   GLUCOSEU NEGATIVE 11/19/2017 1535   GLUCOSEU NEGATIVE 09/28/2016 1500   HGBUR SMALL (A) 11/19/2017 1535   BILIRUBINUR NEGATIVE 11/19/2017 1535   BILIRUBINUR Negative 07/05/2014 1622   KETONESUR NEGATIVE 11/19/2017 1535   PROTEINUR 100 (A) 11/19/2017 1535   UROBILINOGEN 0.2 09/28/2016 1500   NITRITE NEGATIVE 11/19/2017 1535   LEUKOCYTESUR NEGATIVE 11/19/2017 1535   LEUKOCYTESUR Trace (A) 07/05/2014 1622   Sepsis Labs: @LABRCNTIP (procalcitonin:4,lacticidven:4)  ) Recent Results (from the past 240 hour(s))  Blood culture (routine x 2)     Status: None   Collection Time: 11/16/17  7:35 PM  Result Value Ref Range Status   Specimen Description BLOOD RIGHT ANTECUBITAL  Final   Special Requests   Final    BOTTLES DRAWN AEROBIC AND ANAEROBIC Blood Culture adequate volume   Culture   Final    NO GROWTH 5 DAYS Performed at [facility], [place] [address], [place], [place] [address]    Report Status 11/21/2017 FINAL  Final  Blood culture (routine x 2)  Status: None   Collection Time: 11/16/17  7:42 PM  Result Value Ref Range Status   Specimen Description BLOOD RIGHT HAND  Final   Special Requests   Final    BOTTLES DRAWN AEROBIC AND ANAEROBIC Blood Culture results may not be optimal due to an inadequate volume of blood received in culture bottles   Culture   Final    NO GROWTH 5 DAYS Performed at [facility], [place] [address] [place], [place] [address]    Report Status 11/21/2017 FINAL  Final  Respiratory Panel by PCR     Status: None   Collection Time: 11/17/17  5:57 AM  Result Value Ref Range Status   Adenovirus NOT DETECTED NOT DETECTED Final   Coronavirus 229E NOT DETECTED NOT DETECTED Final   Coronavirus HKU1 NOT DETECTED NOT DETECTED Final   Coronavirus NL63 NOT DETECTED NOT DETECTED Final   Coronavirus OC43 NOT DETECTED NOT DETECTED Final   Metapneumovirus  NOT DETECTED NOT DETECTED Final   Rhinovirus / Enterovirus NOT DETECTED NOT DETECTED Final   Influenza A NOT DETECTED NOT DETECTED Final   Influenza B NOT DETECTED NOT DETECTED Final   Parainfluenza Virus 1 NOT DETECTED NOT DETECTED Final   Parainfluenza Virus 2 NOT DETECTED NOT DETECTED Final   Parainfluenza Virus 3 NOT DETECTED NOT DETECTED Final   Parainfluenza Virus 4 NOT DETECTED NOT DETECTED Final   Respiratory Syncytial Virus NOT DETECTED NOT DETECTED Final   Bordetella pertussis NOT DETECTED NOT DETECTED Final   Chlamydophila pneumoniae NOT DETECTED NOT DETECTED Final   Mycoplasma pneumoniae NOT DETECTED NOT DETECTED Final  Culture, Urine     Status: None   Collection Time: 11/19/17  3:24 PM  Result Value Ref Range Status   Specimen Description URINE, CLEAN CATCH  Final   Special Requests NONE  Final   Culture   Final    NO GROWTH Performed at [facility], [address] [place], [place] [address]    Report Status 11/20/2017 FINAL  Final         Radiology Studies: No results found.      Scheduled Meds: . acetaminophen  650 mg Oral BID  . aspirin EC  81 mg Oral Daily  . atorvastatin  20 mg Oral QHS  . benzonatate  100 mg Oral BID  . calcitonin (salmon)  1 spray Alternating Nares Daily  . cefdinir  300 mg Oral Daily  . cholecalciferol  2,000 Units Oral Daily  . darifenacin  7.5 mg Oral Daily  . donepezil  10 mg Oral Daily  . enoxaparin (LOVENOX) injection  30 mg Subcutaneous Daily  . ipratropium-albuterol  3 mL Nebulization BID  . levothyroxine  100 mcg Oral QAC breakfast  . magnesium oxide  400 mg Oral Daily  . mouth rinse  15 mL Mouth Rinse BID  . metoprolol succinate  25 mg Oral Daily  . pantoprazole  40 mg Oral BID AC  . polyethylene glycol  17 g Oral Daily  . triamcinolone  2 spray Nasal Daily  . vitamin B-12  1,000 mcg Oral Daily   Continuous Infusions: . zoledronic acid (ZOMETA) IV       LOS: 6 days    Time spent:  70min    Domenic Polite, MD [facility] Page via [email or web address], password [email or web address] After 7PM please contact night-coverage  11/22/2017, 11:30 AM

## 2017-11-23 LAB — BASIC METABOLIC PANEL
Anion gap: 6 (ref 5–15)
BUN: 29 mg/dL — AB (ref 8–23)
CHLORIDE: 104 mmol/L (ref 98–111)
CO2: 26 mmol/L (ref 22–32)
Calcium: 12.3 mg/dL — ABNORMAL HIGH (ref 8.9–10.3)
Creatinine, Ser: 1.69 mg/dL — ABNORMAL HIGH (ref 0.44–1.00)
GFR calc non Af Amer: 25 mL/min — ABNORMAL LOW (ref >60)
GFR, EST AFRICAN AMERICAN: 29 mL/min — AB (ref >60)
GLUCOSE: 93 mg/dL (ref 70–99)
POTASSIUM: 4.2 mmol/L (ref 3.5–5.1)
SODIUM: 136 mmol/L (ref 135–145)

## 2017-11-23 LAB — CBC
HEMATOCRIT: 27.1 % — AB (ref 36.0–46.0)
HEMOGLOBIN: 8.1 g/dL — AB (ref 12.0–15.0)
MCH: 30.6 pg (ref 26.0–34.0)
MCHC: 29.9 g/dL — ABNORMAL LOW (ref 30.0–36.0)
MCV: 102.3 fL — AB (ref 80.0–100.0)
Platelets: 530 10*3/uL — ABNORMAL HIGH (ref 150–400)
RBC: 2.65 MIL/uL — ABNORMAL LOW (ref 3.87–5.11)
RDW: 20.8 % — ABNORMAL HIGH (ref 11.5–15.5)
WBC: 4.5 10*3/uL (ref 4.0–10.5)
nRBC: 0.7 % — ABNORMAL HIGH (ref 0.0–0.2)

## 2017-11-23 LAB — VITAMIN D 25 HYDROXY (VIT D DEFICIENCY, FRACTURES): Vit D, 25-Hydroxy: 37.8 ng/mL (ref 30.0–100.0)

## 2017-11-23 LAB — PTH, INTACT AND CALCIUM
Calcium, Total (PTH): 13.3 mg/dL (ref 8.7–10.3)
PTH: 274 pg/mL — AB (ref 15–65)

## 2017-11-23 MED ORDER — OXYCODONE HCL 20 MG/ML PO CONC
5.0000 mg | ORAL | Status: DC | PRN
  Filled 2017-11-23: qty 1

## 2017-11-23 MED ORDER — OXYCODONE HCL 20 MG/ML PO CONC: 5.0000 mg | ORAL | Status: DC | PRN

## 2017-11-23 MED ORDER — BISACODYL 10 MG RE SUPP: 10.0000 mg | Freq: Every day | RECTAL | Status: DC | PRN

## 2017-11-23 MED ORDER — LORAZEPAM 2 MG/ML IJ SOLN: 1.0000 mg | INTRAMUSCULAR | Status: DC | PRN

## 2017-11-23 MED ORDER — LORAZEPAM 2 MG/ML PO CONC: 1.0000 mg | ORAL | Status: DC | PRN

## 2017-11-23 MED ORDER — ATROPINE SULFATE 1 % OP SOLN: 4.0000 [drp] | OPHTHALMIC | Status: DC | PRN

## 2017-11-23 MED ORDER — MAGIC MOUTHWASH W/LIDOCAINE: 15.0000 mL | Freq: Four times a day (QID) | ORAL | Status: DC | PRN

## 2017-11-23 MED ORDER — POLYVINYL ALCOHOL 1.4 % OP SOLN: 1.0000 [drp] | Freq: Four times a day (QID) | OPHTHALMIC | Status: DC | PRN

## 2017-11-23 MED ORDER — ACETAMINOPHEN 325 MG PO TABS: 650.0000 mg | ORAL_TABLET | Freq: Four times a day (QID) | ORAL | Status: DC | PRN

## 2017-11-23 MED ORDER — ONDANSETRON 4 MG PO TBDP: 4.0000 mg | ORAL_TABLET | Freq: Four times a day (QID) | ORAL | Status: DC | PRN

## 2017-11-23 MED ORDER — MORPHINE SULFATE (PF) 2 MG/ML IV SOLN: 2.0000 mg | INTRAVENOUS | Status: DC | PRN

## 2017-11-23 MED ORDER — OLANZAPINE 5 MG PO TBDP
5.0000 mg | ORAL_TABLET | Freq: Every day | ORAL | Status: DC
  Administered 2017-11-23 – 2017-11-24 (×2): 5 mg via ORAL
  Filled 2017-11-23 (×2): qty 1

## 2017-11-23 MED ORDER — SODIUM CHLORIDE 0.9 % IV SOLN
INTRAVENOUS | Status: DC
  Administered 2017-11-23: 11:00:00 via INTRAVENOUS

## 2017-11-23 MED ORDER — LORAZEPAM 1 MG PO TABS: 1.0000 mg | ORAL_TABLET | ORAL | Status: DC | PRN

## 2017-11-23 MED ORDER — ONDANSETRON HCL 4 MG/2ML IJ SOLN: 4.0000 mg | Freq: Four times a day (QID) | INTRAMUSCULAR | Status: DC | PRN

## 2017-11-23 MED ORDER — OXYCODONE HCL 20 MG/ML PO CONC
5.0000 mg | ORAL | Status: DC | PRN
  Administered 2017-11-24: 5 mg via ORAL

## 2017-11-23 MED ORDER — ACETAMINOPHEN 650 MG RE SUPP: 650.0000 mg | Freq: Four times a day (QID) | RECTAL | Status: DC | PRN

## 2017-11-23 MED ORDER — HYDROCODONE-ACETAMINOPHEN 5-325 MG PO TABS: 1.0000 | ORAL_TABLET | ORAL | Status: DC | PRN

## 2017-11-23 NOTE — Progress Notes (Signed)
Physical Therapy Treatment Patient Details Name: Allison Page MRN: [id number] DOB: [birth year] Today's Date: 11/23/2017    History of Present Illness Pt is a 84 y/o female admitted from ALF secondary to hypoxemia. Imaging revealed PNA vs. atelectasis. PMH includes dementia, HTN, TIA, DM, and anemia.     PT Comments    Patient limited by cognition. Upon entering patient persevering  "oh, oh, oh, oh". Unable to follow cues or commands this session, which is a decline in cognitive functioning from prior PT sessions. Max A to sit EOB not tolerating therapy today, crying out any body part lightly touched "it hurts, it hurts, it hurts". Discussed with RN.     Follow Up Recommendations  SNF;Supervision/Assistance - 24 hour     Equipment Recommendations  None recommended by PT    Recommendations for Other Services       Precautions / Restrictions Restrictions Weight Bearing Restrictions: No    Mobility  Bed Mobility Overal bed mobility: Needs Assistance Bed Mobility: Supine to Sit     Supine to sit: +2 for physical assistance;Mod assist Sit to supine: Max assist;Total assist;+2 for physical assistance   General bed mobility comments: Pt requires Max A to come to sitting, support for legs and trunk. limited more by cognition at this point  Transfers                 General transfer comment: unable due to agitation   Ambulation/Gait                 Stairs             Wheelchair Mobility    Modified Rankin (Stroke Patients Only)       Balance Overall balance assessment: Needs assistance Sitting-balance support: Bilateral upper extremity supported;Feet supported Sitting balance-Leahy Scale: Fair Sitting balance - Comments: required max to total assist for sitting balance due to increased posterior lean                                    Cognition Arousal/Alertness: Awake/alert Behavior During Therapy:  Anxious;Agitated Overall Cognitive Status: History of cognitive impairments - at baseline                                 General Comments: patient preservating and criing out with the sligtest touch, any part of her body, "it hurts, it hurts" when finger is placed on her gently. "oh, oh, oh" at rest upon entry. Unable to follow cues or commands this visit, which is a decrease in cognition from prior visits.       Exercises      General Comments General comments (skin integrity, edema, etc.): 2L O2      Pertinent Vitals/Pain Pain Assessment: Faces Faces Pain Scale: Hurts little more Pain Location: generalized Pain Descriptors / Indicators: Grimacing;Guarding Pain Intervention(s): Limited activity within patient's tolerance    Home Living                      Prior Function            PT Goals (current goals can now be found in the care plan section) Acute Rehab PT Goals Patient Stated Goal: none stated PT Goal Formulation: Patient unable to participate in goal setting Time For Goal Achievement: 11/06/2017 Potential to Achieve Goals: Fair Progress  towards PT goals: Progressing toward goals    Frequency    Min 2X/week      PT Plan Current plan remains appropriate    Co-evaluation              AM-PAC PT "6 Clicks" Daily Activity  Outcome Measure  Difficulty turning over in bed (including adjusting bedclothes, sheets and blankets)?: A Lot Difficulty moving from lying on back to sitting on the side of the bed? : A Lot Difficulty sitting down on and standing up from a chair with arms (e.g., wheelchair, bedside commode, etc,.)?: A Lot Help needed moving to and from a bed to chair (including a wheelchair)?: A Lot Help needed walking in hospital room?: Total Help needed climbing 3-5 steps with a railing? : Total 6 Click Score: 10    End of Session Equipment Utilized During Treatment: Gait belt Activity Tolerance: Patient limited by  pain Patient left: with call bell/phone within reach;in chair;with chair alarm set Nurse Communication: Mobility status PT Visit Diagnosis: Unsteadiness on feet (R26.81);Other abnormalities of gait and mobility (R26.89);Muscle weakness (generalized) (M62.81);History of falling (Z91.81);Pain     Time: 5885-0277 PT Time Calculation (min) (ACUTE ONLY): 19 min  Charges:  $Therapeutic Activity: 8-22 mins                    Reinaldo Berber, PT, DPT Acute Rehabilitation Services Pager: [phone] Office: [phone]    Reinaldo Berber 11/23/2017, 12:09 PM

## 2017-11-23 NOTE — Progress Notes (Signed)
PROGRESS NOTE    Allison Page [facility]  CZY:[id number] DOB: 07-Jun-1922 DOA: 11/16/2017 PCP: Allison Pi, NP  Brief Narrative: Allison Page is a 85 y.o. female with medical history significant of osteoarthritis, dementia, type 2 diabetes mellitus, hypertension, dyslipidemia, hypothyroidism, iron deficiency anemia, history of transitory ischemic attacks, chronic kidney disease stage III, and anemia of chronic disease.  About 7 days ago she was diagnosed with bronchitis, she had an inhaler/bronchodilator and a cough suppressive agent prescribed.  Her symptoms continued to deteriorate, consistent with productive cough, congestion, and wheezing.  On the day of admission she had severe dyspnea. -in ED she was hypoxic, chest x-ray showed pneumonia versus atelectasis -more confused today  Assessment & Plan:   Acute hypoxic respiratory failure -Due to pneumonia +/- bronchitis -Repeat chest x-ray with bibasilar atelectasis -Was on IV ceftriaxone and azithromycin, this was changed to cefepime and azithromycin due to UTI and lethargy while on ceftriaxone -Improving and stable, will transition to oral Omnicef -Continue pulmonary toilet, nebulizations -Mucinex, Tessalon Perles, wean O2 -SLP evaluation: Mild aspiration risk suspected, started dysphagia 2 diet with thin liquids  UTI/urinary retention -10/19 developed acute urinary retention with lethargy, urinalysis was highly suggestive of urinary tract infection, Foley catheter had to be placed for retention -Antibiotics broadened to cefepime -Cultures negative -Addition to oral Omnicef -Discontinue Foley catheter  Chronic anemia -Due to chronic kidney disease and iron deficiency -transfused 1 unit PRBC on admission -Anemia panel suggestive of chronic disease -Hb is stable  Acute metabolic encephalopathy -In the setting of infection, chronic dementia -Caution with sedating meds benzos etc.  Acute kidney injury on chronic kidney disease  stage III -Slightly worsening of creatinine to 2.4 -hydrated with gentle IV fluids, creatinine improved and back to baseline  Hypothyroidism -Continue Synthroid  Hypertension -Continue metoprolol  Osteoarthritis/chronic pain -Continue home regimen of oxycodone PRN  Hypercalcemia -ongoing for 5 months with slight worsening -Ca in 12 range, was upto 13.4,  2-81months ago -PTH was 264 61months ago c/w Primary hyperparathyroidism -CA worsened from 12 to 13.4 today, patient received Zometa x1 and calcitonin, will take a few days to improve  DVT prophylaxis: Lovenox Code Status: DNR, discussed with patient's daughter at bedside.  She has many questions regarding patient's prognosis and with patient's chronic kidney disease, dementia, hypothyroidism, aspiration risk I suspect that the patient is at high risk for any significant event in less than 6 months with frequent hospitalization. Daughter who is the power of attorney felt that patient should not be hospitalized if her condition worsens and requested palliative care and hospice. I agree that the patient would be an ideal candidate for hospice at SNF, social worker consulted and patient's goals of care change in the hospital. Family Communication: family at bedside, called and d/w daughter Allison Page Disposition Plan: SNF in 1-2days  Consultants:      Procedures:   Antimicrobials:    Subjective: More agitated.  No nausea no vomiting.  More confusion  Objective: Vitals:   11/22/17 1942 11/22/17 2314 11/23/17 0752 11/23/17 0824  BP:  (!) 155/52 (!) 174/46   Pulse:  72 66   Resp:  18 14   Temp:  98.8 F (37.1 C) 98.6 F (37 C)   TempSrc:  Oral Oral   SpO2: 100% 98% 98% 97%  Weight:      Height:        Intake/Output Summary (Last 24 hours) at 11/23/2017 1927 Last data filed at 11/23/2017 1647 Gross per 24 hour  Intake  464.15 ml  Output 602 ml  Net -137.85 ml   Filed Weights   11/16/17 1855 11/16/17 2314  Weight: 51.2  kg 47.5 kg    Examination:  Gen: Awake, Alert, Oriented X 2, more alert and interactive this morning HEENT: PERRLA Lungs: Scattered rhonchi bilaterally CVS: S1-S2/regular rate rhythm Abd: soft, Non tender, non distended, BS present Extremities: No Cyanosis, Clubbing or edema Skin: no new rashes Psychiatry: pleasant, flat affect    Data Reviewed:   CBC: Recent Labs  Lab 11/19/17 0224 11/20/17 0215 11/21/17 0333 11/22/17 0822 11/23/17 0252  WBC 3.6* 4.0 4.0 5.3 4.5  HGB 9.3* 8.6* 8.6* 8.8* 8.1*  HCT 31.2* 28.6* 28.2* 29.0* 27.1*  MCV 99.7 100.7* 100.7* 102.5* 102.3*  PLT 452* 467* 465* 496* 542*   Basic Metabolic Panel: Recent Labs  Lab 11/19/17 0224 11/20/17 0215 11/21/17 0333 11/22/17 0822 11/22/17 1146 11/23/17 0252  NA 140 139 138 137  --  136  K 4.0 4.3 4.0 4.1  --  4.2  CL 110 110 109 107  --  104  CO2 25 25 26 25   --  26  GLUCOSE 98 126* 114* 121*  --  93  BUN 34* 44* 36* 31*  --  29*  CREATININE 2.21* 2.30* 1.82* 1.79*  --  1.69*  CALCIUM 12.9* 13.0* 12.7* 13.4* 13.3* 12.3*   GFR: Estimated Creatinine Clearance: 15.6 mL/min (A) (by C-G formula based on SCr of 1.69 mg/dL (H)). Liver Function Tests: No results for input(s): AST, ALT, ALKPHOS, BILITOT, PROT, ALBUMIN in the last 168 hours. No results for input(s): LIPASE, AMYLASE in the last 168 hours. No results for input(s): AMMONIA in the last 168 hours. Coagulation Profile: No results for input(s): INR, PROTIME in the last 168 hours. Cardiac Enzymes: No results for input(s): CKTOTAL, CKMB, CKMBINDEX, TROPONINI in the last 168 hours. BNP (last 3 results) No results for input(s): PROBNP in the last 8760 hours. HbA1C: No results for input(s): HGBA1C in the last 72 hours. CBG: Recent Labs  Lab 11/16/17 1928 11/19/17 0743  GLUCAP 132* 100*   Lipid Profile: No results for input(s): CHOL, HDL, LDLCALC, TRIG, CHOLHDL, LDLDIRECT in the last 72 hours. Thyroid Function Tests: No results for  input(s): TSH, T4TOTAL, FREET4, T3FREE, THYROIDAB in the last 72 hours. Anemia Panel: No results for input(s): VITAMINB12, FOLATE, FERRITIN, TIBC, IRON, RETICCTPCT in the last 72 hours. Urine analysis:    Component Value Date/Time   COLORURINE YELLOW 11/19/2017 1535   APPEARANCEUR CLOUDY (A) 11/19/2017 1535   APPEARANCEUR Cloudy (A) 07/05/2014 1622   LABSPEC 1.012 11/19/2017 1535   PHURINE 7.0 11/19/2017 1535   GLUCOSEU NEGATIVE 11/19/2017 1535   GLUCOSEU NEGATIVE 09/28/2016 1500   HGBUR SMALL (A) 11/19/2017 1535   BILIRUBINUR NEGATIVE 11/19/2017 1535   BILIRUBINUR Negative 07/05/2014 1622   KETONESUR NEGATIVE 11/19/2017 1535   PROTEINUR 100 (A) 11/19/2017 1535   UROBILINOGEN 0.2 09/28/2016 1500   NITRITE NEGATIVE 11/19/2017 1535   LEUKOCYTESUR NEGATIVE 11/19/2017 1535   LEUKOCYTESUR Trace (A) 07/05/2014 1622   Sepsis Labs: @LABRCNTIP (procalcitonin:4,lacticidven:4)  ) Recent Results (from the past 240 hour(s))  Blood culture (routine x 2)     Status: None   Collection Time: 11/16/17  7:35 PM  Result Value Ref Range Status   Specimen Description BLOOD RIGHT ANTECUBITAL  Final   Special Requests   Final    BOTTLES DRAWN AEROBIC AND ANAEROBIC Blood Culture adequate volume   Culture   Final    NO GROWTH 5 DAYS  Performed at [facility], [place] [address], [place], [place] [address]    Report Status 11/21/2017 FINAL  Final  Blood culture (routine x 2)     Status: None   Collection Time: 11/16/17  7:42 PM  Result Value Ref Range Status   Specimen Description BLOOD RIGHT HAND  Final   Special Requests   Final    BOTTLES DRAWN AEROBIC AND ANAEROBIC Blood Culture results may not be optimal due to an inadequate volume of blood received in culture bottles   Culture   Final    NO GROWTH 5 DAYS Performed at [facility], [place] [address], [place], [place] [address]    Report Status 11/21/2017 FINAL  Final  Respiratory Panel by PCR     Status: None   Collection Time:  11/17/17  5:57 AM  Result Value Ref Range Status   Adenovirus NOT DETECTED NOT DETECTED Final   Coronavirus 229E NOT DETECTED NOT DETECTED Final   Coronavirus HKU1 NOT DETECTED NOT DETECTED Final   Coronavirus NL63 NOT DETECTED NOT DETECTED Final   Coronavirus OC43 NOT DETECTED NOT DETECTED Final   Metapneumovirus NOT DETECTED NOT DETECTED Final   Rhinovirus / Enterovirus NOT DETECTED NOT DETECTED Final   Influenza A NOT DETECTED NOT DETECTED Final   Influenza B NOT DETECTED NOT DETECTED Final   Parainfluenza Virus 1 NOT DETECTED NOT DETECTED Final   Parainfluenza Virus 2 NOT DETECTED NOT DETECTED Final   Parainfluenza Virus 3 NOT DETECTED NOT DETECTED Final   Parainfluenza Virus 4 NOT DETECTED NOT DETECTED Final   Respiratory Syncytial Virus NOT DETECTED NOT DETECTED Final   Bordetella pertussis NOT DETECTED NOT DETECTED Final   Chlamydophila pneumoniae NOT DETECTED NOT DETECTED Final   Mycoplasma pneumoniae NOT DETECTED NOT DETECTED Final  Culture, Urine     Status: None   Collection Time: 11/19/17  3:24 PM  Result Value Ref Range Status   Specimen Description URINE, CLEAN CATCH  Final   Special Requests NONE  Final   Culture   Final    NO GROWTH Performed at [facility], [address] [place], [place] [address]    Report Status 11/20/2017 FINAL  Final         Radiology Studies: No results found.      Scheduled Meds: . acetaminophen  650 mg Oral BID  . cefdinir  300 mg Oral Daily  . darifenacin  7.5 mg Oral Daily  . donepezil  10 mg Oral Daily  . ipratropium-albuterol  3 mL Nebulization BID  . levothyroxine  100 mcg Oral QAC breakfast  . mouth rinse  15 mL Mouth Rinse BID  . metoprolol succinate  25 mg Oral Daily  . OLANZapine zydis  5 mg Oral Daily  . polyethylene glycol  17 g Oral Daily  . triamcinolone  2 spray Nasal Daily   Continuous Infusions:    LOS: 7 days    Time spent: 52min    Pranav Patel  [facility] Page via  [email or web address], password [email or web address] After 7PM please contact night-coverage  11/23/2017, 7:27 PM

## 2017-11-23 NOTE — Progress Notes (Signed)
SLP Cancellation Note  Patient Details Name: Allison Page MRN: [id number] DOB: September 10, 1922   Cancelled treatment:       Reason Eval/Treat Not Completed: SLP screened, no needs identified, will sign off   Pt was receiving nursing care upon SLP arrival but daughter was present and spoke to SLP about current level of functio/pt's overall decline over the last year or so. She said that she has had trouble swallowing due to "a pocket in her throat at the top of her esophagus where food gets stuck" - sounds like a diverticulum, but dtr could not recall. She said that she stopped eating anything with texture to it out of fear of it sticking. She said that more recently she seems to have lost the drive to try to eat and drink/cognitively does not realize she needs to do so.   Pt's daughter said that she is awaiting a palliative care consult and would like to focus on pt comfort first and foremost. She would like her diet to be downgraded to Dys 1 textures and thin liquids, to allow pt the textures she is most comfortable with, but she does not feel the need to have additional SLP f/u acutely. Will make diet changes, but will defer additional f/u acutely. Please reconsult if any further needs arise.   Germain Osgood 11/23/2017, 3:42 PM  Germain Osgood, M.A. [place] Acute Environmental education officer [phone] Office [phone]

## 2017-11-24 MED ORDER — OXYCODONE HCL 5 MG/5ML PO SOLN: 5.0000 mg | ORAL | 0 refills | Status: AC | PRN

## 2017-11-24 MED ORDER — ATROPINE SULFATE 1 % OP SOLN: 4.0000 [drp] | OPHTHALMIC | 12 refills | Status: AC | PRN

## 2017-11-24 MED ORDER — LORAZEPAM 2 MG/ML PO CONC: 1.0000 mg | ORAL | 0 refills | Status: AC | PRN

## 2017-11-24 MED ORDER — OLANZAPINE 5 MG PO TBDP: 5.0000 mg | ORAL_TABLET | Freq: Every day | ORAL | 0 refills | Status: AC

## 2017-11-24 NOTE — Discharge Summary (Signed)
[facility] Discharge Summary   Patient: Allison Page LFY:[id number]   PCP: Jacelyn Pi, NP DOB: 1922/06/15   Date of admission: 11/16/2017   Date of discharge:  11/24/2017    Discharge Diagnoses:  Principal diagnosis Acute hypoxic respiratory failure secondary to pneumonia  Active Problems:   Iron deficiency anemia   Anemia of chronic renal failure, stage 3 (moderate) (HCC)   DM type 2 (diabetes mellitus, type 2) ([place])   Essential hypertension, benign   Hypoxemia   Admitted From: SNF Disposition:  SNF with hospice  Recommendations for Outpatient Follow-up:  1. Please follow-up with PCP in 1 week. 2. Please establish care with hospice at the nursing home  [place], [place] Valere Dross, NP. Schedule an appointment as soon as possible for a visit in 1 week(s).   Specialty:  Nurse Practitioner Contact information: [address] [place] [address] [phone]        hospice at SNF. Schedule an appointment as soon as possible for a visit in 1 day(s).          Diet recommendation: Dysphagia 1 diet thin liquids  Activity: The patient is advised to gradually reintroduce usual activities.  Discharge Condition: stable  Code Status: DNR/DNI, comfort care  History of present illness: As per the H and P dictated on admission, "Allison Page is a 84 y.o. female with medical history significant of osteoarthritis, dementia, type 2 diabetes mellitus, hypertension, dyslipidemia, hypothyroidism, iron deficiency anemia, history of transitory ischemic attacks, chronic kidney disease stage III, and anemia of chronic disease.  About 7 days ago she was diagnosed with bronchitis, she had an inhaler/bronchodilator and a cough suppressive agent prescribed.  Her symptoms continued to deteriorate, consistent with productive cough, congestion, and wheezing.  On the day of admission she had severe dyspnea, no improving or worsening factors,  associated with confusion more than her baseline.  Due to her worsening symptoms she was brought to the hospital for evaluation.  At her baseline her physical functional capacity is very limited due to her osteoarthritis.  Denies any fever or sick contacts."  Hospital Course:  Summary of her active problems in the hospital is as following. Acute hypoxic respiratory failure -Due to pneumonia +/- bronchitis -Repeat chest x-ray with bibasilar atelectasis -Was on IV ceftriaxone and azithromycin, this was changed to cefepime and azithromycin due to UTI and lethargy while on ceftriaxone -Improving and stable, transition to oral Omnicef, completed course in the hospital -Continue pulmonary toilet, nebulizations -Mucinex, Tessalon Perles, wean O2 -SLP evaluation: Mild aspiration risk suspected, started dysphagia 2 diet with thin liquids Discussed with patient's family and now comfort care and hospice.  UTI/urinary retention -10/19 developed acute urinary retention with lethargy, urinalysis was highly suggestive of urinary tract infection, Foley catheter had to be placed for retention -Antibiotics broadened to cefepime -Cultures negative -Transition to oral Omnicef and completed the course in the hospital -Discontinue Foley catheter  Chronic anemia -Due to chronic kidney disease and iron deficiency -transfused 1 unit PRBC on admission -Anemia panel suggestive of chronic disease -Hb is stable  Acute metabolic encephalopathy -In the setting of infection, chronic dementia and hypercalcemia.  Acute kidney injury on chronic kidney disease stage III -Slightly worsening of creatinine to 2.4 -hydrated with gentle IV fluids, creatinine improved and back to baseline  Hypothyroidism -Continue Synthroid  Hypertension -Continue metoprolol  Osteoarthritis/chronic pain -Continue home regimen of oxycodone PRN  Hypercalcemia -ongoing for 5 months with slight worsening -  Ca in 12 range,  was upto 13.4,  2-80months ago -PTH was 264 100months ago c/w Primary hyperparathyroidism -CA worsened from 12 to 13.4, patient received Zometa x1 and calcitonin, will take a few days to improve Suspect that hypercalcemia is primarily driven by chronic use of vitamin D as well as poor p.o. intake.  Goals of care discussion DNR, discussed with patient's daughter at bedside.   She has many questions regarding patient's prognosis and with patient's chronic kidney disease, dementia, hypothyroidism, aspiration risk I suspect that the patient is at high risk for a significant event in less than 6 months with frequent hospitalization. Daughter who is the power of attorney felt that patient should not be hospitalized if her condition worsens and requested palliative care and hospice. I agree that the patient would be an ideal candidate for hospice at SNF, social worker consulted and patient's goals of care change in the hospital. Family Communication: family at bedside, called and d/w daughter [name]  All other chronic medical condition were stable during the hospitalization.  Patient was referred to hospice, which was arranged by social worker and case Freight forwarder. On the day of the discharge the patient's vitals were stable , and no other acute medical condition were reported by patient. the patient was felt safe to be discharge at SNF with hospice.  Consultants: none Procedures: none  DISCHARGE MEDICATION: Allergies as of 11/24/2017      Reactions   Allegra Allergy [fexofenadine Hcl] Anaphylaxis   Amoxicillin    Penicillins    Sulfa Antibiotics       Medication List    STOP taking these medications   Acetaminophen-Codeine 300-30 MG tablet   amLODipine 10 MG tablet Commonly known as:  NORVASC   aspirin 81 MG tablet   atorvastatin 20 MG tablet Commonly known as:  LIPITOR   BLOOD GLUCOSE TEST STRIPS Strp   conjugated estrogens vaginal cream Commonly known as:  PREMARIN   docusate 50  MG/5ML liquid Commonly known as:  COLACE   fexofenadine 180 MG tablet Commonly known as:  ALLEGRA   GAVILAX powder Generic drug:  polyethylene glycol powder   hydrALAZINE 50 MG tablet Commonly known as:  APRESOLINE   LORazepam 0.5 MG tablet Commonly known as:  ATIVAN Replaced by:  LORazepam 2 MG/ML concentrated solution   magnesium oxide 400 MG tablet Commonly known as:  MAG-OX   Melatonin 3 MG Subl   mupirocin ointment 2 % Commonly known as:  BACTROBAN   NASACORT ALLERGY 24HR 55 MCG/ACT Aero nasal inhaler Generic drug:  triamcinolone   oseltamivir 75 MG capsule Commonly known as:  TAMIFLU   pantoprazole 40 MG tablet Commonly known as:  PROTONIX   polyethylene glycol packet Commonly known as:  MIRALAX / GLYCOLAX   predniSONE 10 MG tablet Commonly known as:  DELTASONE   traMADol 50 MG tablet Commonly known as:  ULTRAM   triamcinolone cream 0.1 % Commonly known as:  KENALOG   trolamine salicylate 10 % cream Commonly known as:  ASPERCREME   vitamin B-12 1000 MCG tablet Commonly known as:  CYANOCOBALAMIN   Vitamin D 2000 units tablet     TAKE these medications   acetaminophen 325 MG tablet Commonly known as:  TYLENOL Take 650 mg by mouth 2 (two) times daily. What changed:  Another medication with the same name was removed. Continue taking this medication, and follow the directions you see here.   albuterol 108 (90 Base) MCG/ACT inhaler Commonly known as:  PROVENTIL HFA;VENTOLIN HFA  Inhale 2 puffs into the lungs 4 (four) times daily.   atropine 1 % ophthalmic solution Place 4 drops under the tongue every 4 (four) hours as needed (excessive secretions).   darifenacin 7.5 MG 24 hr tablet Commonly known as:  ENABLEX Take 1 tablet (7.5 mg total) by mouth daily.   donepezil 10 MG tablet Commonly known as:  ARICEPT Take 1 tablet (10 mg total) by mouth daily.   guaifenesin 100 MG/5ML syrup Commonly known as:  ROBITUSSIN Take 15 mLs by mouth 2 (two)  times daily. What changed:  Another medication with the same name was removed. Continue taking this medication, and follow the directions you see here.   HYDROcodone-acetaminophen 5-325 MG tablet Commonly known as:  NORCO/VICODIN Take 1 tablet by mouth every 6 (six) hours as needed for moderate pain. What changed:    when to take this  reasons to take this   levothyroxine 100 MCG tablet Commonly known as:  SYNTHROID, LEVOTHROID TAKE ONE TABLET BY MOUTH ONCE DAILY.   LORazepam 2 MG/ML concentrated solution Commonly known as:  ATIVAN Place 0.5 mLs (1 mg total) under the tongue every 4 (four) hours as needed for anxiety. Replaces:  LORazepam 0.5 MG tablet   metoprolol succinate 50 MG 24 hr tablet Commonly known as:  TOPROL-XL Take 1 tablet (50 mg total) by mouth daily. Take with or immediately following a meal. What changed:  how much to take   OLANZapine zydis 5 MG disintegrating tablet Commonly known as:  ZYPREXA Take 1 tablet (5 mg total) by mouth daily. Start taking on:  11/25/2017   oxyCODONE 5 MG/5ML solution Commonly known as:  ROXICODONE Take 5 mLs (5 mg total) by mouth every 4 (four) hours as needed for severe pain.   senna-docusate 8.6-50 MG tablet Commonly known as:  Senokot-S Take 1-2 tablets, IF NEEDED, every 12 hrs for constipation What changed:    how much to take  how to take this  when to take this  reasons to take this  additional instructions      Allergies  Allergen Reactions  . Allegra Allergy [Fexofenadine Hcl] Anaphylaxis  . Amoxicillin   . Penicillins   . Sulfa Antibiotics    Discharge Instructions    Diet - low sodium heart healthy   Complete by:  As directed    Increase activity slowly   Complete by:  As directed      Discharge Exam: Filed Weights   11/16/17 1855 11/16/17 2314  Weight: 51.2 kg 47.5 kg   Vitals:   11/24/17 0736 11/24/17 0905  BP: (!) 162/49   Pulse: 62   Resp:    Temp: 98.2 F (36.8 C)   SpO2: 93% 95%    General: Appear in mild distress, no Rash; Oral Mucosa moist Cardiovascular: S1 and S2 Present, aortic systolic  Murmur, no JVD Respiratory: Bilateral Air entry present and bilateral  Crackles, no wheezes Abdomen: Bowel Sound present, Soft and no tenderness Extremities: no Pedal edema, no calf tenderness Neurology: Grossly no focal neuro deficit.  The results of significant diagnostics from this hospitalization (including imaging, microbiology, ancillary and laboratory) are listed below for reference.    Significant Diagnostic Studies: Dg Chest 1 View  Result Date: 11/17/2017 CLINICAL DATA:  Shortness of breath.  Cough.  Congestion. EXAM: CHEST  1 VIEW COMPARISON:  11/16/2017. FINDINGS: Stable mild cardiomegaly with normal pulmonary vascular mild bibasilar atelectasis/scarring. No pleural effusion or pneumothorax. Degenerative change thoracic spine with thoracic spine scoliosis. Stable lower thoracic  vertebral body compression fractures. Degenerative changes thoracic spine. Degenerative changes both shoulders. No acute bony abnormality identified. Surgical clips left chest. IMPRESSION: 1.  Stable mild cardiomegaly. 2.  Mild bibasilar atelectasis and/or scarring. Electronically Signed   By: Marcello Moores  Register   On: 11/17/2017 09:42   Dg Chest 2 View  Result Date: 11/16/2017 CLINICAL DATA:  84 year old female with generalized weakness and dysphagia. EXAM: CHEST - 2 VIEW COMPARISON:  Chest radiographs 09/18/2017 and earlier. FINDINGS: Calcified aortic atherosclerosis. Stable cardiac size at the upper limits of normal to mildly increased. Mildly lower lung volumes. No pneumothorax, pulmonary edema, pleural effusion or confluent pulmonary opacity. Osteopenia. No acute osseous abnormality identified. Negative visible bowel gas pattern. IMPRESSION: 1. Lower lung volumes, otherwise no acute cardiopulmonary abnormality. 2.  Aortic Atherosclerosis (ICD10-I70.0). Electronically Signed   By: Genevie Ann M.D.    On: 11/16/2017 21:20   Ct Head Wo Contrast  Result Date: 11/16/2017 CLINICAL DATA:  84 year old female with weakness and dysphagia. EXAM: CT HEAD WITHOUT CONTRAST TECHNIQUE: Contiguous axial images were obtained from the base of the skull through the vertex without intravenous contrast. COMPARISON:  09/18/2017 head CT and earlier. FINDINGS: Brain: Stable cerebral volume. Stable gray-white matter differentiation throughout the brain. No midline shift, ventriculomegaly, mass effect, evidence of mass lesion, intracranial hemorrhage or evidence of cortically based acute infarction. No cortical encephalomalacia identified. Vascular: Calcified atherosclerosis at the skull base. Skull: No suspicious intracranial vascular hyperdensity. Sinuses/Orbits: Visualized paranasal sinuses and mastoids are stable and well pneumatized. Other: Stable orbit and scalp soft tissues. IMPRESSION: No acute findings.  Stable non contrast CT appearance of the brain. Electronically Signed   By: Genevie Ann M.D.   On: 11/16/2017 21:09    Microbiology: Recent Results (from the past 240 hour(s))  Blood culture (routine x 2)     Status: None   Collection Time: 11/16/17  7:35 PM  Result Value Ref Range Status   Specimen Description BLOOD RIGHT ANTECUBITAL  Final   Special Requests   Final    BOTTLES DRAWN AEROBIC AND ANAEROBIC Blood Culture adequate volume   Culture   Final    NO GROWTH 5 DAYS Performed at [facility], [address] [place], [place] [address]    Report Status 11/21/2017 FINAL  Final  Blood culture (routine x 2)     Status: None   Collection Time: 11/16/17  7:42 PM  Result Value Ref Range Status   Specimen Description BLOOD RIGHT HAND  Final   Special Requests   Final    BOTTLES DRAWN AEROBIC AND ANAEROBIC Blood Culture results may not be optimal due to an inadequate volume of blood received in culture bottles   Culture   Final    NO GROWTH 5 DAYS Performed at [facility], [place] [address], [place], [place] [address]    Report Status 11/21/2017 FINAL  Final  Respiratory Panel by PCR     Status: None   Collection Time: 11/17/17  5:57 AM  Result Value Ref Range Status   Adenovirus NOT DETECTED NOT DETECTED Final   Coronavirus 229E NOT DETECTED NOT DETECTED Final   Coronavirus HKU1 NOT DETECTED NOT DETECTED Final   Coronavirus NL63 NOT DETECTED NOT DETECTED Final   Coronavirus OC43 NOT DETECTED NOT DETECTED Final   Metapneumovirus NOT DETECTED NOT DETECTED Final   Rhinovirus / Enterovirus NOT DETECTED NOT DETECTED Final   Influenza A NOT DETECTED NOT DETECTED Final   Influenza B NOT DETECTED NOT DETECTED  Final   Parainfluenza Virus 1 NOT DETECTED NOT DETECTED Final   Parainfluenza Virus 2 NOT DETECTED NOT DETECTED Final   Parainfluenza Virus 3 NOT DETECTED NOT DETECTED Final   Parainfluenza Virus 4 NOT DETECTED NOT DETECTED Final   Respiratory Syncytial Virus NOT DETECTED NOT DETECTED Final   Bordetella pertussis NOT DETECTED NOT DETECTED Final   Chlamydophila pneumoniae NOT DETECTED NOT DETECTED Final   Mycoplasma pneumoniae NOT DETECTED NOT DETECTED Final  Culture, Urine     Status: None   Collection Time: 11/19/17  3:24 PM  Result Value Ref Range Status   Specimen Description URINE, CLEAN CATCH  Final   Special Requests NONE  Final   Culture   Final    NO GROWTH Performed at [facility], [address], [place], [place] [address]    Report Status 11/20/2017 FINAL  Final     Labs: CBC: Recent Labs  Lab 11/19/17 0224 11/20/17 0215 11/21/17 0333 11/22/17 0822 11/23/17 0252  WBC 3.6* 4.0 4.0 5.3 4.5  HGB 9.3* 8.6* 8.6* 8.8* 8.1*  HCT 31.2* 28.6* 28.2* 29.0* 27.1*  MCV 99.7 100.7* 100.7* 102.5* 102.3*  PLT 452* 467* 465* 496* 244*   Basic Metabolic Panel: Recent Labs  Lab 11/19/17 0224 11/20/17 0215 11/21/17 0333 11/22/17 0822 11/22/17 1146 11/23/17 0252  NA 140 139 138 137  --  136  K 4.0 4.3 4.0 4.1  --  4.2  CL 110 110 109 107  --  104    CO2 25 25 26 25   --  26  GLUCOSE 98 126* 114* 121*  --  93  BUN 34* 44* 36* 31*  --  29*  CREATININE 2.21* 2.30* 1.82* 1.79*  --  1.69*  CALCIUM 12.9* 13.0* 12.7* 13.4* 13.3* 12.3*   Liver Function Tests: No results for input(s): AST, ALT, ALKPHOS, BILITOT, PROT, ALBUMIN in the last 168 hours. No results for input(s): LIPASE, AMYLASE in the last 168 hours. No results for input(s): AMMONIA in the last 168 hours. Cardiac Enzymes: No results for input(s): CKTOTAL, CKMB, CKMBINDEX, TROPONINI in the last 168 hours. BNP (last 3 results) No results for input(s): BNP in the last 8760 hours. CBG: Recent Labs  Lab 11/19/17 0743  GLUCAP 100*   Time spent: 35 minutes  Signed:  Berle Mull  [facility]  11/24/2017  , 12:13 PM

## 2017-11-24 NOTE — Clinical Social Work Note (Signed)
Clinical Social Worker facilitated patient discharge including contacting patient family and facility to confirm patient discharge plans.  Clinical information faxed to facility and family agreeable with plan.  CSW arranged ambulance transport via [facility] (2:00)  to [facility].  RN to call [phone]  for report prior to discharge.  Clinical Social Worker will sign off for now as social work intervention is no longer needed. Please consult [place] again if new need arises.  [place], [place]

## 2017-11-24 NOTE — Clinical Social Work Placement (Signed)
   CLINICAL SOCIAL WORK PLACEMENT  NOTE  Date:  11/24/2017  Patient Details  Name: Allison Page MRN: [id number] Date of Birth: Jun 05, 1922  Clinical Social Work is seeking post-discharge placement for this patient at the [place] level of care (*CSW will initial, date and re-position this form in  chart as items are completed):      Patient/family provided with [place] Work Department's list of facilities offering this level of care within the geographic area requested by the patient (or if unable, by the patient's family).  Yes   Patient/family informed of their freedom to choose among providers that offer the needed level of care, that participate in Medicare, Medicaid or managed care program needed by the patient, have an available bed and are willing to accept the patient.      Patient/family informed of [facility]'s ownership interest in [facility] and [facility], as well as of the fact that they are under no obligation to receive care at these facilities.  PASRR submitted to EDS on       PASRR number received on       Existing PASRR number confirmed on       FL2 transmitted to all facilities in geographic area requested by pt/family on       FL2 transmitted to all facilities within larger geographic area on       Patient informed that his/her managed care company has contracts with or will negotiate with certain facilities, including the following:        Yes   Patient/family informed of bed offers received.  Patient chooses bed at [facility] [place]     Physician recommends and patient chooses bed at      Patient to be transferred to [facility] on 11/24/17.  Patient to be transferred to facility by [facility] (2:00)     Patient family notified on 11/24/17 of transfer.  Name of family member notified:  [name]     PHYSICIAN Please prepare priority discharge summary, including medications      Additional Comment:    _______________________________________________ Eileen Stanford, LCSW 11/24/2017, 11:21 AM

## 2017-11-29 ENCOUNTER — Telehealth: Payer: Self-pay

## 2017-11-29 NOTE — Telephone Encounter (Signed)
Received notice from [facility] that pt has been admitted for end-of-life care.  Hospice Attending: Benedict Needy, ANP

## 2017-12-02 DEATH — deceased

## 2017-12-13 ENCOUNTER — Other Ambulatory Visit: Payer: Medicare Other

## 2017-12-13 ENCOUNTER — Ambulatory Visit: Payer: Medicare Other

## 2017-12-13 ENCOUNTER — Ambulatory Visit: Payer: Medicare Other | Admitting: Family

## 2017-12-23 ENCOUNTER — Telehealth: Payer: Self-pay

## 2017-12-23 NOTE — Telephone Encounter (Signed)
I have talked with Allison Page/patient's daughter---she has advised that patient is deceased as of October 31,2019---routing to medical records, fyi.Marland KitchenMarland Kitchen

## 2018-06-12 ENCOUNTER — Ambulatory Visit: Payer: Medicare Other | Admitting: Neurology

## 2019-03-31 ENCOUNTER — Other Ambulatory Visit: Payer: Self-pay | Admitting: Nurse Practitioner

## 2019-12-01 IMAGING — DX DG CHEST 2V
2 series · 2 of 2 positions shown · non-contrast
Comparison: None.

CLINICAL DATA: Congestion, cough, shortness of breath

EXAM:
CHEST - 2 VIEW

[chest pa]
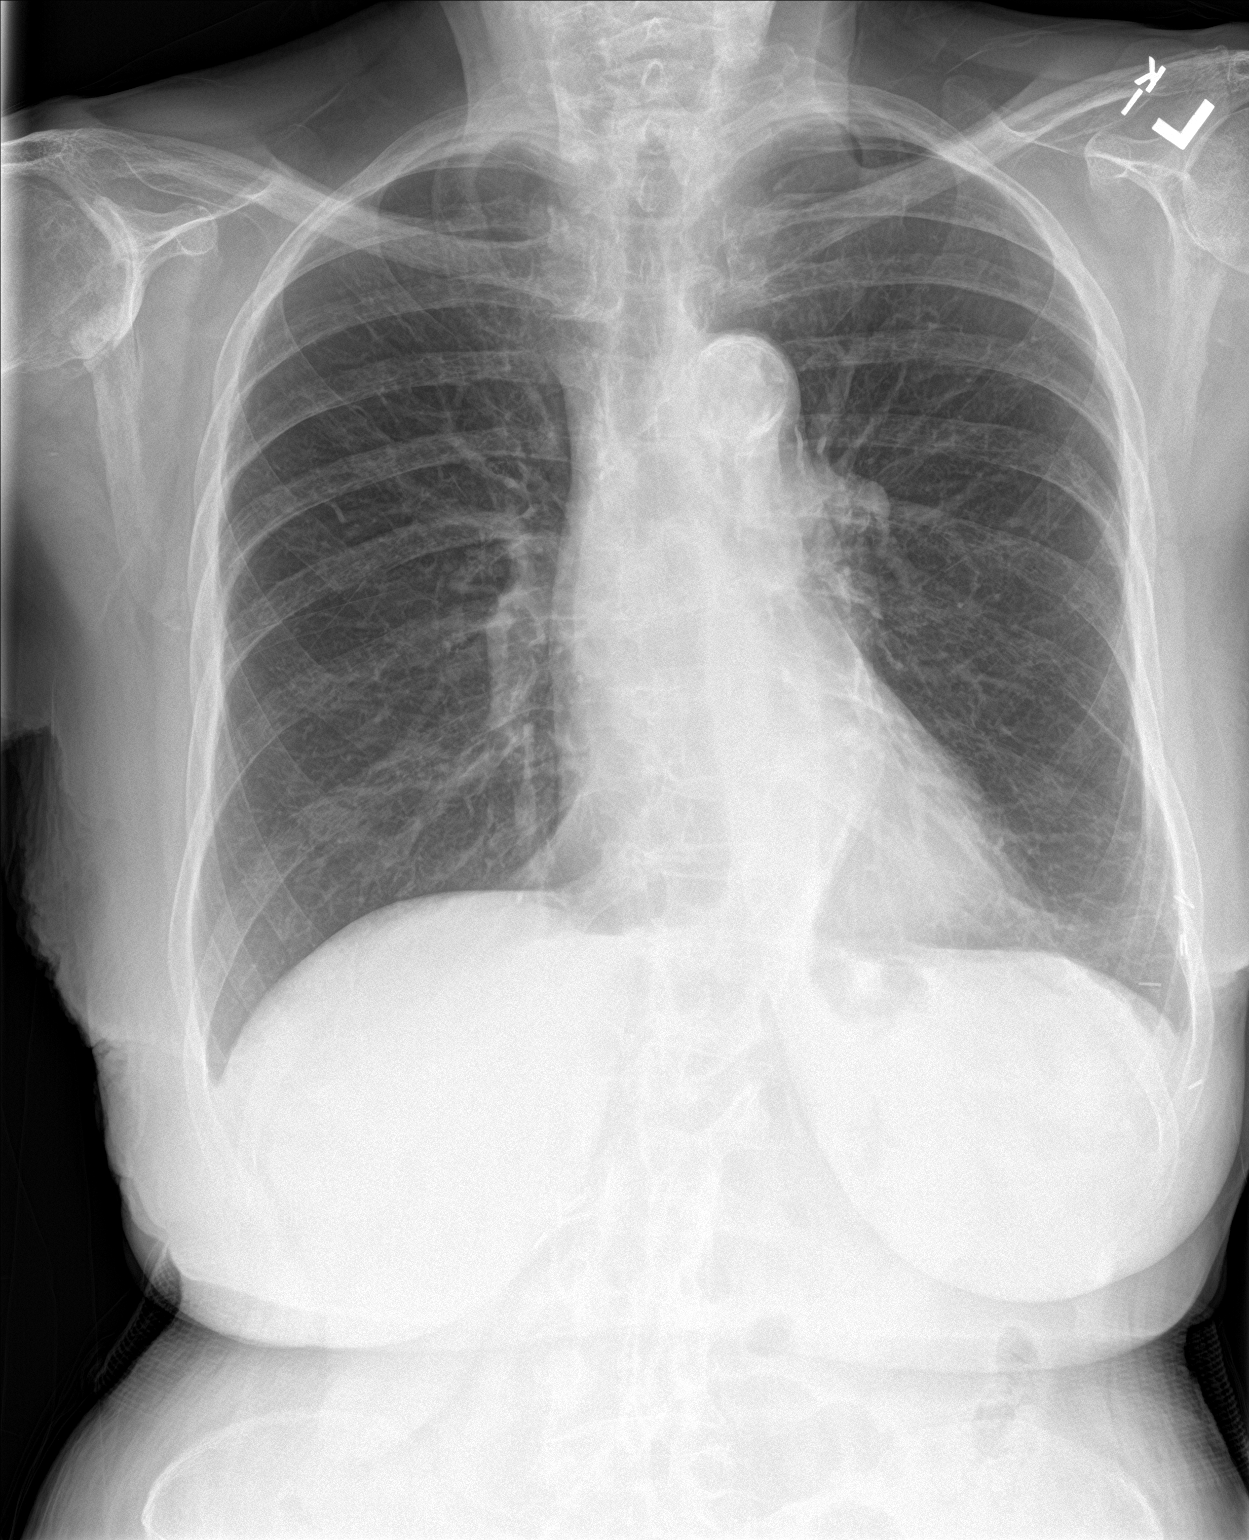

[chest lat]
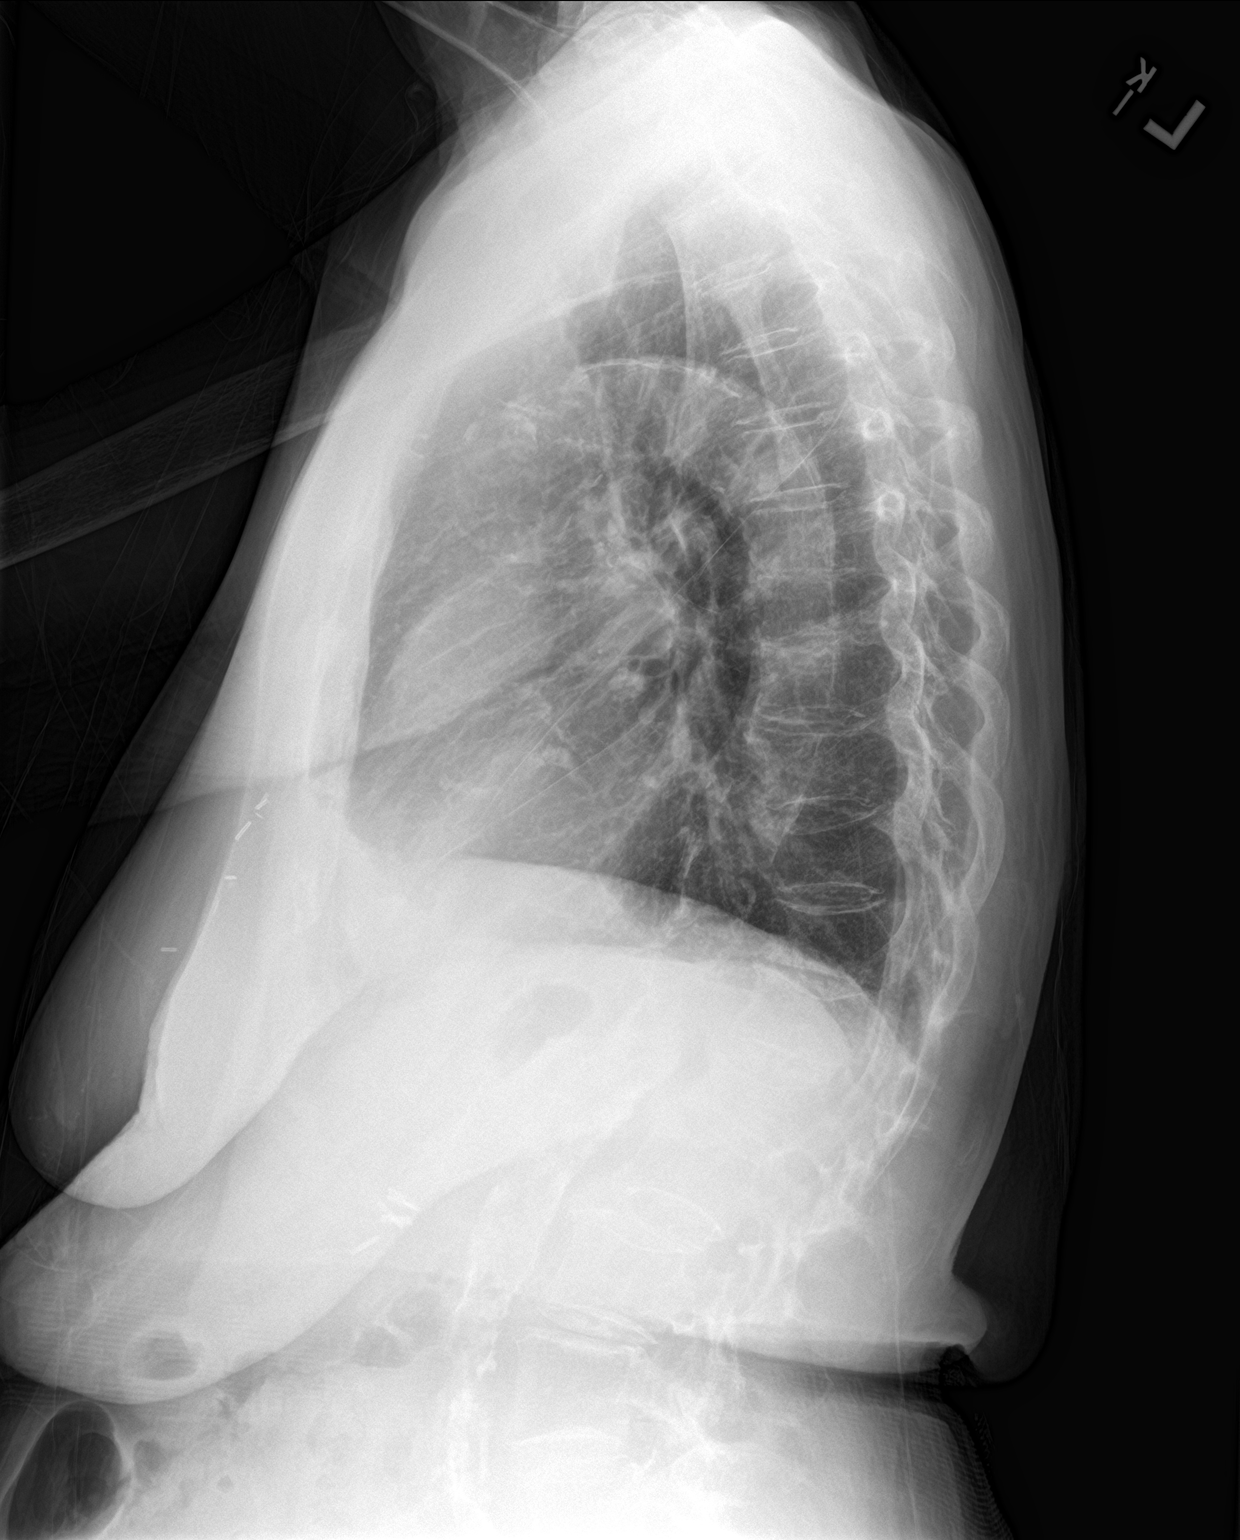

[2 of 2 positions shown; findings below may reference images not displayed]

FINDINGS: Mild hyperinflation without focal pneumonia, collapse or
consolidation. Negative for edema, effusion or pneumothorax. Normal
heart size and vascularity. Aorta is atherosclerotic and tortuous.
Trachea is midline. Degenerative changes of the spine with
associated scoliosis.

Shoulder degenerative changes noted, worse on the right. Bones are
osteopenic. Normal bowel gas pattern. Chronic appearing compression
deformities at the thoracolumbar junction. Remote cholecystectomy
evident.
IMPRESSION: Hyperinflation without acute chest process

Atherosclerosis and aortic tortuosity

Chronic degenerative changes and postoperative findings as above.

## 2019-12-26 IMAGING — DX DG PELVIS 1-2V
1 series · 1 of 1 positions shown · non-contrast
Comparison: None.

CLINICAL DATA: Unwitnessed fall

EXAM:
PELVIS - 1-2 VIEW

[pelvis ap]
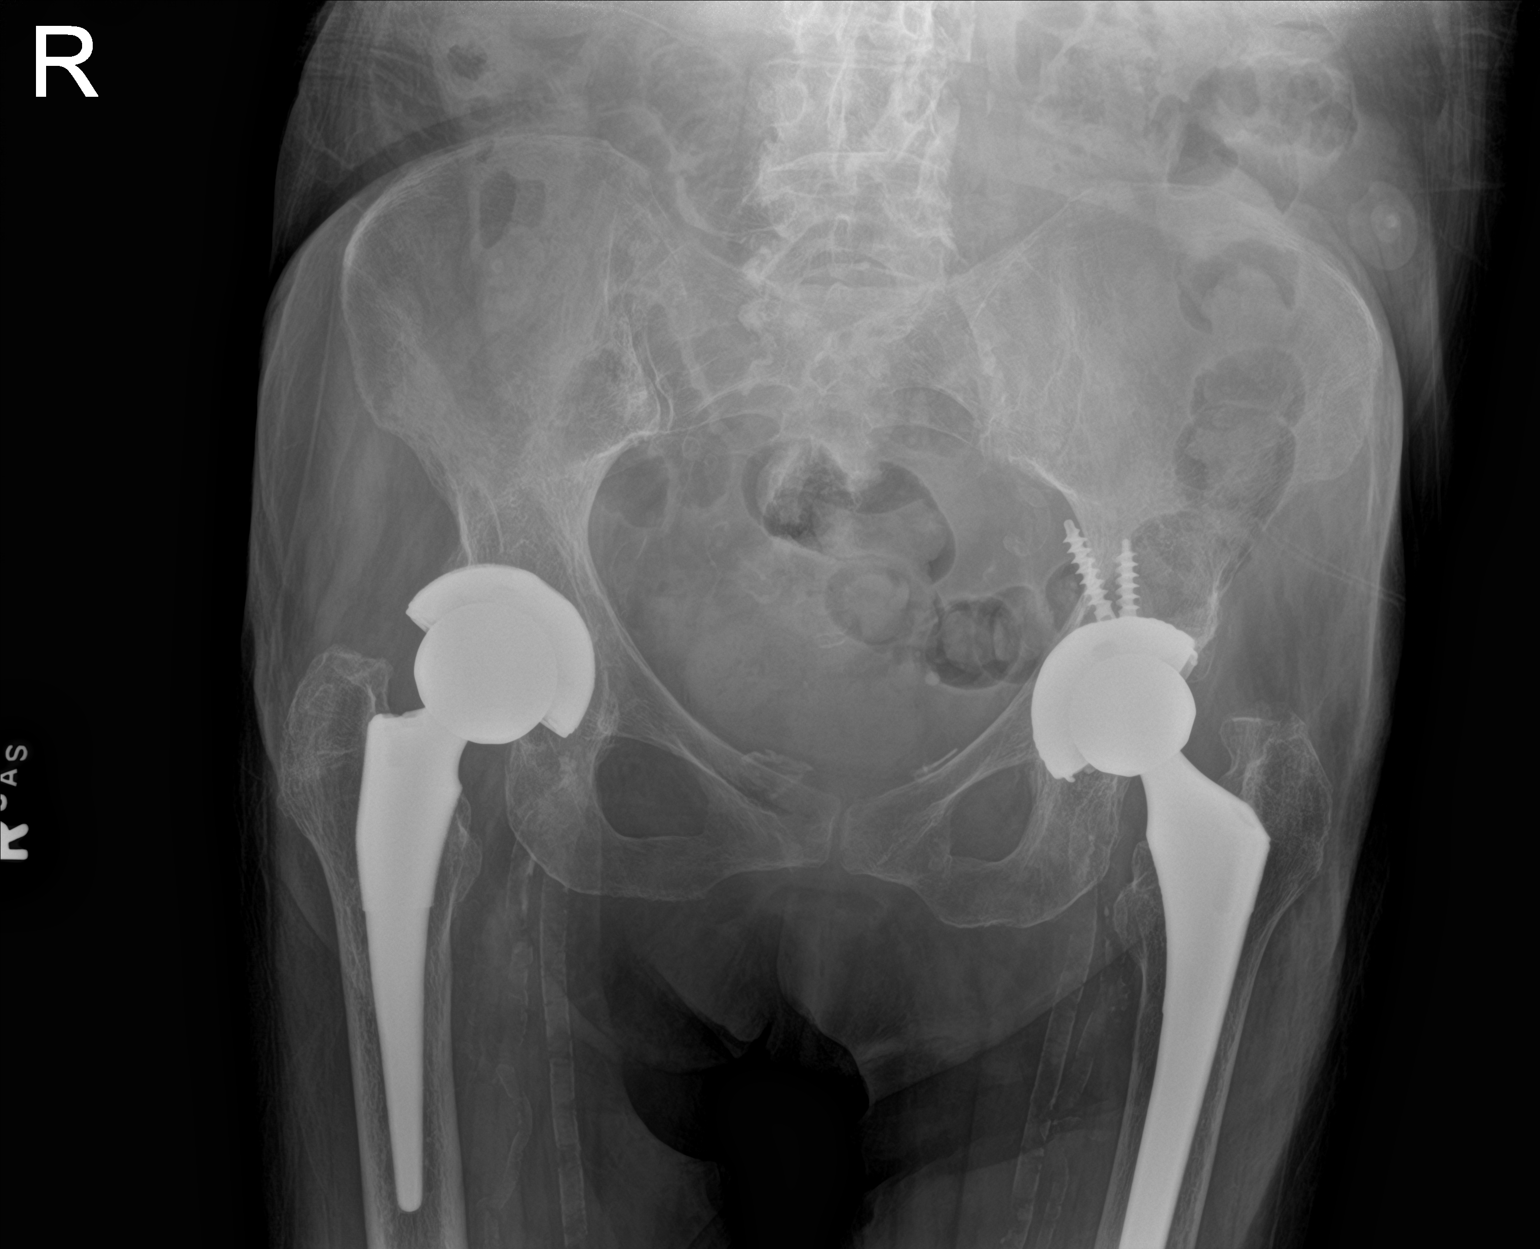

[1 of 1 positions shown; findings below may reference images not displayed]

FINDINGS: Status post bilateral hip replacements. No dislocation is evident.
Protrusion of left acetabular cup screw beyond the inner cortical
margin of the pelvic ring. No acute displaced fracture. Extensive
vascular calcification. SI joints are non widened. Calcifications
superior to the rami do not have appearance suggestive of a
fracture.
IMPRESSION: Status post bilateral hip replacement. No acute osseous abnormality
is seen.
